# Patient Record
Sex: Male | Born: 1945 | Race: White | Hispanic: No | Marital: Single | Smoking: Never smoker
Health system: Southern US, Community
[De-identification: ages and names within clinical notes are randomized; demographics above are authoritative.]

## PROBLEM LIST (undated history)

## (undated) DIAGNOSIS — E7800 Pure hypercholesterolemia, unspecified: Secondary | ICD-10-CM

## (undated) DIAGNOSIS — I10 Essential (primary) hypertension: Secondary | ICD-10-CM

## (undated) DIAGNOSIS — I2510 Atherosclerotic heart disease of native coronary artery without angina pectoris: Secondary | ICD-10-CM

## (undated) DIAGNOSIS — G4700 Insomnia, unspecified: Secondary | ICD-10-CM

## (undated) DIAGNOSIS — I639 Cerebral infarction, unspecified: Secondary | ICD-10-CM

## (undated) DIAGNOSIS — K2970 Gastritis, unspecified, without bleeding: Secondary | ICD-10-CM

## (undated) HISTORY — PX: CATARACT EXTRACTION: SUR2

## (undated) HISTORY — DX: Insomnia, unspecified: G47.00

## (undated) HISTORY — DX: Pure hypercholesterolemia, unspecified: E78.00

## (undated) HISTORY — DX: Atherosclerotic heart disease of native coronary artery without angina pectoris: I25.10

## (undated) HISTORY — DX: Essential (primary) hypertension: I10

## (undated) HISTORY — DX: Cerebral infarction, unspecified: I63.9

## (undated) HISTORY — DX: Gastritis, unspecified, without bleeding: K29.70

---

## 1999-08-06 ENCOUNTER — Encounter: Payer: Self-pay | Admitting: Emergency Medicine

## 1999-08-06 ENCOUNTER — Emergency Department (HOSPITAL_COMMUNITY): Admission: EM | Admit: 1999-08-06 | Discharge: 1999-08-06 | Payer: Self-pay | Admitting: Emergency Medicine

## 1999-08-09 ENCOUNTER — Encounter: Payer: Self-pay | Admitting: *Deleted

## 1999-08-09 ENCOUNTER — Ambulatory Visit (HOSPITAL_COMMUNITY): Admission: RE | Admit: 1999-08-09 | Discharge: 1999-08-09 | Payer: Self-pay | Admitting: *Deleted

## 2000-04-15 ENCOUNTER — Emergency Department (HOSPITAL_COMMUNITY): Admission: EM | Admit: 2000-04-15 | Discharge: 2000-04-16 | Payer: Self-pay | Admitting: Emergency Medicine

## 2000-04-16 ENCOUNTER — Encounter: Payer: Self-pay | Admitting: Internal Medicine

## 2002-05-25 ENCOUNTER — Encounter: Admission: RE | Admit: 2002-05-25 | Discharge: 2002-05-25 | Payer: Self-pay | Admitting: Internal Medicine

## 2002-05-29 ENCOUNTER — Encounter: Admission: RE | Admit: 2002-05-29 | Discharge: 2002-05-29 | Payer: Self-pay | Admitting: Internal Medicine

## 2002-05-29 ENCOUNTER — Encounter: Admission: RE | Admit: 2002-05-29 | Discharge: 2002-05-29 | Payer: Self-pay | Admitting: *Deleted

## 2002-06-15 ENCOUNTER — Encounter: Admission: RE | Admit: 2002-06-15 | Discharge: 2002-06-15 | Payer: Self-pay | Admitting: Internal Medicine

## 2002-06-24 ENCOUNTER — Encounter: Admission: RE | Admit: 2002-06-24 | Discharge: 2002-09-22 | Payer: Self-pay | Admitting: Internal Medicine

## 2002-06-29 ENCOUNTER — Encounter: Admission: RE | Admit: 2002-06-29 | Discharge: 2002-06-29 | Payer: Self-pay | Admitting: Internal Medicine

## 2002-07-30 ENCOUNTER — Encounter: Admission: RE | Admit: 2002-07-30 | Discharge: 2002-07-30 | Payer: Self-pay | Admitting: Internal Medicine

## 2002-09-01 ENCOUNTER — Encounter: Admission: RE | Admit: 2002-09-01 | Discharge: 2002-09-01 | Payer: Self-pay | Admitting: Internal Medicine

## 2002-09-23 ENCOUNTER — Encounter: Admission: RE | Admit: 2002-09-23 | Discharge: 2002-12-22 | Payer: Self-pay | Admitting: Internal Medicine

## 2004-06-18 HISTORY — PX: CORONARY ARTERY BYPASS GRAFT: SHX141

## 2004-11-08 ENCOUNTER — Inpatient Hospital Stay (HOSPITAL_COMMUNITY): Admission: AD | Admit: 2004-11-08 | Discharge: 2004-11-13 | Payer: Self-pay | Admitting: Cardiology

## 2004-11-08 ENCOUNTER — Ambulatory Visit: Payer: Self-pay | Admitting: Cardiology

## 2004-11-17 ENCOUNTER — Emergency Department (HOSPITAL_COMMUNITY): Admission: EM | Admit: 2004-11-17 | Discharge: 2004-11-17 | Payer: Self-pay | Admitting: Emergency Medicine

## 2004-11-23 ENCOUNTER — Ambulatory Visit: Payer: Self-pay | Admitting: Cardiology

## 2004-12-01 ENCOUNTER — Ambulatory Visit: Payer: Self-pay | Admitting: Internal Medicine

## 2004-12-12 ENCOUNTER — Ambulatory Visit: Payer: Self-pay | Admitting: Internal Medicine

## 2004-12-20 ENCOUNTER — Ambulatory Visit: Payer: Self-pay | Admitting: Internal Medicine

## 2004-12-20 ENCOUNTER — Ambulatory Visit (HOSPITAL_COMMUNITY): Admission: RE | Admit: 2004-12-20 | Discharge: 2004-12-20 | Payer: Self-pay | Admitting: Internal Medicine

## 2005-01-03 ENCOUNTER — Ambulatory Visit: Payer: Self-pay | Admitting: Internal Medicine

## 2005-01-04 ENCOUNTER — Ambulatory Visit: Payer: Self-pay | Admitting: Cardiology

## 2005-01-08 ENCOUNTER — Encounter (HOSPITAL_COMMUNITY): Admission: RE | Admit: 2005-01-08 | Discharge: 2005-04-08 | Payer: Self-pay | Admitting: Cardiology

## 2005-01-10 ENCOUNTER — Ambulatory Visit: Payer: Self-pay | Admitting: Internal Medicine

## 2005-02-01 ENCOUNTER — Ambulatory Visit: Payer: Self-pay | Admitting: Internal Medicine

## 2005-03-07 ENCOUNTER — Ambulatory Visit: Payer: Self-pay | Admitting: Cardiology

## 2005-03-12 ENCOUNTER — Inpatient Hospital Stay (HOSPITAL_COMMUNITY): Admission: EM | Admit: 2005-03-12 | Discharge: 2005-03-16 | Payer: Self-pay | Admitting: Emergency Medicine

## 2005-03-12 ENCOUNTER — Ambulatory Visit: Payer: Self-pay | Admitting: Physical Medicine & Rehabilitation

## 2005-03-12 ENCOUNTER — Ambulatory Visit: Payer: Self-pay | Admitting: Internal Medicine

## 2005-03-13 ENCOUNTER — Encounter: Payer: Self-pay | Admitting: Cardiology

## 2005-03-13 ENCOUNTER — Ambulatory Visit: Payer: Self-pay | Admitting: Cardiology

## 2005-04-05 ENCOUNTER — Ambulatory Visit: Payer: Self-pay | Admitting: Cardiology

## 2005-05-16 ENCOUNTER — Ambulatory Visit: Payer: Self-pay | Admitting: Cardiology

## 2005-05-24 ENCOUNTER — Ambulatory Visit: Payer: Self-pay | Admitting: Cardiology

## 2005-10-08 ENCOUNTER — Emergency Department (HOSPITAL_COMMUNITY): Admission: EM | Admit: 2005-10-08 | Discharge: 2005-10-08 | Payer: Self-pay | Admitting: Emergency Medicine

## 2005-10-18 ENCOUNTER — Ambulatory Visit: Payer: Self-pay | Admitting: Cardiology

## 2005-10-31 ENCOUNTER — Ambulatory Visit: Payer: Self-pay | Admitting: Cardiology

## 2005-10-31 ENCOUNTER — Ambulatory Visit: Payer: Self-pay

## 2005-11-23 ENCOUNTER — Ambulatory Visit: Payer: Self-pay | Admitting: Internal Medicine

## 2005-12-04 ENCOUNTER — Ambulatory Visit: Payer: Self-pay | Admitting: Cardiology

## 2006-04-13 DIAGNOSIS — E119 Type 2 diabetes mellitus without complications: Secondary | ICD-10-CM | POA: Insufficient documentation

## 2006-04-13 DIAGNOSIS — H40009 Preglaucoma, unspecified, unspecified eye: Secondary | ICD-10-CM | POA: Insufficient documentation

## 2006-04-13 DIAGNOSIS — I2510 Atherosclerotic heart disease of native coronary artery without angina pectoris: Secondary | ICD-10-CM | POA: Insufficient documentation

## 2006-04-13 DIAGNOSIS — Z87898 Personal history of other specified conditions: Secondary | ICD-10-CM

## 2006-04-13 DIAGNOSIS — Z8679 Personal history of other diseases of the circulatory system: Secondary | ICD-10-CM

## 2006-04-13 DIAGNOSIS — E7800 Pure hypercholesterolemia, unspecified: Secondary | ICD-10-CM

## 2006-04-13 DIAGNOSIS — R079 Chest pain, unspecified: Secondary | ICD-10-CM | POA: Insufficient documentation

## 2006-04-13 DIAGNOSIS — R1013 Epigastric pain: Secondary | ICD-10-CM

## 2006-04-13 DIAGNOSIS — G4700 Insomnia, unspecified: Secondary | ICD-10-CM

## 2006-04-13 DIAGNOSIS — Z8673 Personal history of transient ischemic attack (TIA), and cerebral infarction without residual deficits: Secondary | ICD-10-CM | POA: Insufficient documentation

## 2006-04-13 DIAGNOSIS — I10 Essential (primary) hypertension: Secondary | ICD-10-CM | POA: Insufficient documentation

## 2006-05-07 ENCOUNTER — Ambulatory Visit: Payer: Self-pay | Admitting: Cardiology

## 2006-05-15 ENCOUNTER — Ambulatory Visit: Payer: Self-pay | Admitting: Internal Medicine

## 2006-05-15 ENCOUNTER — Ambulatory Visit (HOSPITAL_COMMUNITY): Admission: RE | Admit: 2006-05-15 | Discharge: 2006-05-15 | Payer: Self-pay | Admitting: Cardiology

## 2006-05-25 DIAGNOSIS — Z951 Presence of aortocoronary bypass graft: Secondary | ICD-10-CM

## 2006-06-25 ENCOUNTER — Emergency Department (HOSPITAL_COMMUNITY): Admission: EM | Admit: 2006-06-25 | Discharge: 2006-06-25 | Payer: Self-pay | Admitting: Emergency Medicine

## 2006-07-10 ENCOUNTER — Ambulatory Visit: Payer: Self-pay | Admitting: Cardiology

## 2007-03-07 ENCOUNTER — Telehealth: Payer: Self-pay | Admitting: Internal Medicine

## 2007-03-28 ENCOUNTER — Emergency Department (HOSPITAL_COMMUNITY): Admission: EM | Admit: 2007-03-28 | Discharge: 2007-03-28 | Payer: Self-pay | Admitting: Emergency Medicine

## 2007-04-07 ENCOUNTER — Ambulatory Visit: Payer: Self-pay | Admitting: Internal Medicine

## 2007-04-30 ENCOUNTER — Ambulatory Visit: Payer: Self-pay

## 2007-04-30 LAB — CONVERTED CEMR LAB
ALT: 29 units/L (ref 0–53)
AST: 30 units/L (ref 0–37)
LDL Cholesterol: 120 mg/dL — ABNORMAL HIGH (ref 0–99)
Total CHOL/HDL Ratio: 3.9

## 2007-05-23 ENCOUNTER — Emergency Department (HOSPITAL_COMMUNITY): Admission: EM | Admit: 2007-05-23 | Discharge: 2007-05-23 | Payer: Self-pay | Admitting: Family Medicine

## 2007-06-23 ENCOUNTER — Ambulatory Visit: Payer: Self-pay | Admitting: Internal Medicine

## 2007-06-25 ENCOUNTER — Encounter (INDEPENDENT_AMBULATORY_CARE_PROVIDER_SITE_OTHER): Payer: Self-pay | Admitting: *Deleted

## 2007-06-30 ENCOUNTER — Ambulatory Visit (HOSPITAL_COMMUNITY): Admission: RE | Admit: 2007-06-30 | Discharge: 2007-06-30 | Payer: Self-pay | Admitting: Internal Medicine

## 2007-06-30 ENCOUNTER — Ambulatory Visit: Payer: Self-pay | Admitting: Endocrinology

## 2007-07-29 ENCOUNTER — Ambulatory Visit: Payer: Self-pay | Admitting: Internal Medicine

## 2007-08-29 ENCOUNTER — Ambulatory Visit: Payer: Self-pay | Admitting: Internal Medicine

## 2008-01-01 ENCOUNTER — Inpatient Hospital Stay (HOSPITAL_COMMUNITY): Admission: EM | Admit: 2008-01-01 | Discharge: 2008-01-02 | Payer: Self-pay | Admitting: Emergency Medicine

## 2008-01-01 ENCOUNTER — Encounter (INDEPENDENT_AMBULATORY_CARE_PROVIDER_SITE_OTHER): Payer: Self-pay | Admitting: Emergency Medicine

## 2008-01-01 ENCOUNTER — Ambulatory Visit: Payer: Self-pay | Admitting: Vascular Surgery

## 2008-02-09 ENCOUNTER — Ambulatory Visit: Payer: Self-pay | Admitting: Internal Medicine

## 2008-07-26 ENCOUNTER — Ambulatory Visit: Payer: Self-pay | Admitting: Internal Medicine

## 2008-07-28 ENCOUNTER — Ambulatory Visit: Payer: Self-pay

## 2008-10-22 ENCOUNTER — Ambulatory Visit: Payer: Self-pay | Admitting: Internal Medicine

## 2008-10-22 ENCOUNTER — Encounter (INDEPENDENT_AMBULATORY_CARE_PROVIDER_SITE_OTHER): Payer: Self-pay | Admitting: *Deleted

## 2008-10-28 ENCOUNTER — Telehealth: Payer: Self-pay | Admitting: Internal Medicine

## 2008-10-29 ENCOUNTER — Ambulatory Visit: Payer: Self-pay | Admitting: Cardiovascular Disease

## 2008-10-29 ENCOUNTER — Inpatient Hospital Stay (HOSPITAL_BASED_OUTPATIENT_CLINIC_OR_DEPARTMENT_OTHER): Admission: RE | Admit: 2008-10-29 | Discharge: 2008-10-29 | Payer: Self-pay | Admitting: Cardiovascular Disease

## 2008-11-18 ENCOUNTER — Ambulatory Visit: Payer: Self-pay | Admitting: Internal Medicine

## 2009-03-23 ENCOUNTER — Encounter (INDEPENDENT_AMBULATORY_CARE_PROVIDER_SITE_OTHER): Payer: Self-pay | Admitting: *Deleted

## 2010-07-16 LAB — CONVERTED CEMR LAB
Basophils Absolute: 0.1 10*3/uL (ref 0.0–0.1)
Chloride: 101 meq/L (ref 96–112)
HCT: 33.5 % — ABNORMAL LOW (ref 39.0–52.0)
Hemoglobin: 11.4 g/dL — ABNORMAL LOW (ref 13.0–17.0)
Lymphocytes Relative: 31 % (ref 12–46)
Lymphs Abs: 2.9 10*3/uL (ref 0.7–4.0)
MCV: 88.6 fL (ref 78.0–100.0)
Monocytes Absolute: 1 10*3/uL (ref 0.1–1.0)
Monocytes Relative: 11 % (ref 3–12)
Platelets: 230 10*3/uL (ref 150–400)
Sodium: 136 meq/L (ref 135–145)
WBC: 9.2 10*3/uL (ref 4.0–10.5)
aPTT: 35 s (ref 24–37)

## 2010-09-26 LAB — POCT I-STAT GLUCOSE: Operator id: 221371

## 2010-10-31 NOTE — Assessment & Plan Note (Signed)
[facility]                            CARDIOLOGY OFFICE NOTE   ZYRUS, HETLAND                        MRN:          [id number]  DATE:06/23/2007                            DOB:          02-27-1946    PATIENT IDENTIFICATION:  [name] is a patient who I saw back in  October. He was a previous patient of Geralynn Rile. 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   When I saw him last, his last cardiac catheterization actually was in  November 2007. He was having some complaints of chest pain a lot. I went  ahead and scheduled a Myoview.   He had this done on November 12. This showed no evidence of a  significant ischemia, no change from the previous scans. He did have  evidence of inferolateral infarct with minimal periinfart ischemia.   He still has intermittent chest pains that are on the left side. He says  he has been told he has had gallbladder problems in the past. No change  though from when I saw him in October. His activities are unchanged. He  does complain of some numbness and achiness in his hands and feet and  his arms. He claims it is more of a stiffness.   CURRENT MEDICATIONS:  1. Multivitamin daily.  2. Aspirin 325.  3. Crestor 10.  4. Atenolol 25.  5. Enalapril 2.5.  6. Cosopt eye drops.   PHYSICAL EXAMINATION:  GENERAL:  The patient is in no distress.  VITAL SIGNS:  Blood pressure is 149/75, pulse is 64 and regular, weight  180.  LUNGS:  Clear.  CARDIAC:  Regular rate and rhythm, S1, S2, no S3.  ABDOMEN:  Mild epigastric tenderness. No masses. No rebound.  EXTREMITIES:  No edema.   IMPRESSION:  1. Coronary artery disease. I am not convinced the patient's current      symptoms are worsening ischemia. He has had chest pain in the past.      He has been told that he had gallstones in the past. I will go      ahead and schedule him for a right upper  quadrant ultrasound to      confirm. I do not see any ER history of this.  2. Dyslipidemia. Will continue on the Crestor but I would like to have      him come off of this to see if his achiness improves. He will call      with his response, if it does not he should call back on it.  3. Hypertension. Blood pressure is a little high today. It has been      good in the past, will follow. He is due to see Romero Belling soon.  4. History of a stroke in the past, normal carotid arteries in      November.  5. History of sinus congestion. Will set up for a sinus CT followup      with this.   Otherwise I  will set followup for late this summer. Again, he has  primary care followup in the near future.     Pricilla Riffle, MD, [facility]  Electronically Signed    PVR/MedQ  DD: 06/23/2007  DT: 06/24/2007  Job #: [id number]

## 2010-10-31 NOTE — Discharge Summary (Signed)
[name], [name]                 ACCOUNT NO.:  [id number]   MEDICAL RECORD NO.:  [id number]          PATIENT TYPE:  INP   LOCATION:  1539                         FACILITY:  [facility]   PHYSICIAN:  Hillery Aldo, M.D.   DATE OF BIRTH:  1945-11-20   DATE OF ADMISSION:  01/01/2008  DATE OF DISCHARGE:  01/02/2008                               DISCHARGE SUMMARY   PRIMARY CARE PHYSICIAN:  Dr. Renae Fickle   DISCHARGE DIAGNOSES:  1. Right right upper extremity deep venous thrombosis.  2. Diabetes.  3. 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  4. Glaucoma.  5. Dyslipidemia.  6. Hypertension.   DISCHARGE MEDICATIONS:  1. Lovenox 120 mg subcutaneously daily.  2. Coumadin 5 mg q.p.m. or as directed.  3. Clonazepam 0.5 to 1 mg q.h.s. p.r.n.  4. Vicodin 03/499 q.6 h p.r.n.  5. Nitroglycerin 0.4 mg sublingually p.r.n.  6. Septra DS 1 tab daily.  7. Aspirin 325 mg daily.  8. Cosopt eye drops 1 drop in the right eye daily.  9. Lantus insulin 7 units subcutaneously q.h.s.  10.Humulog insulin 2-3 units subcutaneously before meals.  11.Multivitamin daily.  12.Iron 325 mg daily.   CONSULTATION:  None.   BRIEF ADMISSION HISTORY OF PRESENT ILLNESS:  The patient is a 65-year-  old male who presented with a chief complaint of right upper extremity  swelling in the setting of recent removal of a peripherally inserted  central catheter for prolonged antibiotic administration.  Upon initial  evaluation in the emergency department, he is found to have a deep  venous thrombosis and therefore was admitted for 23-hour observation for  initiation of anticoagulation.  For full details, please see my dictated  report.   PROCEDURES AND DIAGNOSTIC STUDIES:  1. Doppler ultrasound of the right upper extremity confirmed deep      venous thrombosis on January 01, 2008.  2. Two views of the chest on January 01, 2008 showed slight      hyperinflation configuration with no  pulmonary edema, pneumonia or      other acute process.   DISCHARGE LABORATORY VALUES:  PT was 13.3, INR 1.0.   HOSPITAL COURSE BY PROBLEM:  Acute deep venous thrombosis of the right  upper extremity:  The patient was admitted and commenced on Lovenox,  Coumadin therapy.  He was taught how to self-inject Lovenox.  A home  health nurse was set up to draw his pro times daily and to fax these to  Dr. Samuel Germany for ongoing adjustment of his Coumadin dose to achieve a  therapeutic INR between 2-3.   The patient's other medical problems remained stable   DISPOSITION:  The patient is medically stable for discharge home.      Hillery Aldo, M.D.  Electronically Signed     CR/MEDQ  D:  01/02/2008  T:  01/02/2008  Job:  [id number]   cc:   Renae Fickle  Fax: [phone]

## 2010-10-31 NOTE — Assessment & Plan Note (Signed)
[facility]                            CARDIOLOGY OFFICE NOTE   [name], ALA                        MRN:          [id number]  DATE:07/26/2008                            DOB:          14-Aug-1945    IDENTIFICATION:  [name] is a 65 year old gentleman with a history of  CAD (status post non-ST segment elevation MI in May 2006.  The patient  underwent emergent CABG complicated by an occipital CVA).  Also with a  history of dyslipidemia, hypertension.  I last saw him back in August.   He comes in today since he has been having chest pains.  He has toe  amputated last spring is slowly increasing his activity.  He describes  the episodes of pressure that can last all day and might be exacerbated  by food unclear.  Today, he has pain in the 2-4/10 and it has been with  him it sounds like since this morning.  His breathing has been a little  short for a while.  Has had some nausea and vomiting.   Current medicines include insulin as directed, cold eye drops, iron,  aspirin 81 mg daily.  Note, the patient is no longer on Crestor,  enalapril, or atenolol.   PHYSICAL EXAMINATION:  GENERAL:  The patient is in no distress at rest,  though he reports 2/10 chest pressure.  VITAL SIGNS:  Blood pressure is 112/60, pulse 63 and regular, weight 180  up 4 pounds from previous.  NECK:  JVP is normal.  LUNGS:  No rales or wheezes.  CARDIAC:  Regular rate and rhythm.  S1, S2, no S3, no murmurs.  ABDOMEN:  Benign.  EXTREMITIES:  No edema.   A 12-lead EKG, normal sinus rhythm, 68 beats per minute.   IMPRESSION:  1. Chest pain.  I am not convinced it is angina.  Question if it is      more gastrointestinal related.  I would set him up for a Myoview      scan, last one was in November 2008.  I will also place him on      omeprazole.  He should continue on the aspirin.  2. Dyslipidemia.  Crestor is not paid for.  I will put him on Zocor      and follow up lipids  in 8-12 weeks with AST.  3. Coronary artery disease.  Again Myoview as noted.   I will set to see the patient back in the fall.  I will be in touch with  him once I have seen the test results he has with his Myoview compared  to previous.    Pricilla Riffle, MD, [facility]  Electronically Signed   PVR/MedQ  DD: 07/26/2008  DT: 07/27/2008  Job #: [id number]

## 2010-10-31 NOTE — Cardiovascular Report (Signed)
NAMEKADAN, MILLSTEIN                 ACCOUNT NO.:  [id number]   MEDICAL RECORD NO.:  [id number]          PATIENT TYPE:  OIB   LOCATION:  1963                         FACILITY:  [facility]   PHYSICIAN:  Verne Carrow, MDDATE OF BIRTH:  1946-05-04   DATE OF PROCEDURE:  10/29/2008  DATE OF DISCHARGE:  10/29/2008                            CARDIAC CATHETERIZATION   PRIMARY CARDIOLOGIST:  Pricilla Riffle, MD, [facility]   PROCEDURE PERFORMED:  1. Left heart catheterization  2. Selective coronary angiography.  3. Saphenous vein graft angiography.  4. Left internal mammary artery bypass graft angiography.  5. Left ventricular angiogram.   OPERATOR:  Verne Carrow, MD   INDICATIONS:  Exertional angina in a 65 year old Caucasian male who had  four-vessel bypass performed in 2006 who has a history of diabetes  mellitus, hypertension, and hyperlipidemia.  The patient apparently  presented to [facility] last weekend  complaining of weakness and had a positive CK-MB fraction.  The specific  level of this is not documented in the record.  The patient was  transferred to [facility] at that time for cardiac  catheterization but refused to stay.  He was then seen by Dr. Tenny Craw in  her office on Oct 22, 2008, and plans for this left heart catheterization  were made at that time.   DETAILS OF PROCEDURE:  The patient was brought into the outpatient  cardiac catheterization laboratory after signing informed consent for  the procedure.  The right groin was prepped and draped in sterile  fashion. Lidocaine 1% was used for local anesthesia.  A 4-French sheath  was inserted into the right femoral artery without difficulty.  Standard  diagnostic catheters were used to perform selective coronary  angiography.  The 3-D RC catheter was used to selectively engage the  native right coronary artery, both saphenous vein grafts, and the left  internal mammary artery bypass graft.   Following the performance of  coronary angiography, a left ventricular angiogram was performed with a  pigtail catheter.  There was no pressure gradient across the aortic  valve with pullback of the pigtail catheter.  The patient tolerated the  procedure well and was taken to the holding area in stable condition.   HEMODYNAMIC FINDINGS:  Central aortic pressure 120/64.  Left ventricular  pressure 120/15.  Left ventricular end-diastolic pressure 22.   ANGIOGRAPHIC FINDINGS:  1. The left main coronary artery is a long vessel that has a long      tubular 60% stenosis throughout the proximal and midportion and a      90% stenosis in the distal portion.  2. Left anterior descending is totally occluded at the ostium.  The      mid and distal LAD are known to fill from right to left collaterals      and from the left internal mammary artery graft.  3. The circumflex artery has an 80% ostial lesion and gives off an      early ramus intermedius branch and 2 small obtuse marginal      branches.  The ramus intermedius is  totally occluded proximally.      The first and second obtuse marginal are small.  The mid circumflex      is totally occluded.  4. The right coronary artery is a large-dominant vessel that gives      rise to a moderate-sized posterior descending artery and several      small posterolateral branches.  There are right to left collaterals      seen filling a portion of the left anterior descending.  The      proximal right coronary artery has a 50-60% long tubular lesion.      There are focal 50% lesions in both the mid and distal right      coronary artery.  There is plaque disease noted in the posterior      descending artery.  5. Saphenous vein graft to the ramus intermedius and sequential to the      posterolateral branch is patent.  There is a 20% stenosis in the      proximal body of the vein graft.  There is also a 20% stenosis at      the area of a valve in the body of  the saphenous vein graft between      the 2 points of anastomosis between the vessels.  6. The saphenous vein graft to the diagonal branch is patent.  7. The left internal mammary artery to the left anterior descending is      patent.  There is moderate diffuse disease noted in the distal LAD      but no focal lesions.  8. Left ventricular angiogram was performed in the RAO projection and      shows mild reduction in LV systolic function with ejection fraction      of 40-45%.  There is no significant mitral regurgitation noted.   IMPRESSION:  1. Severe double-vessel coronary artery disease with 4/4 patent bypass      grafts.  2. Moderately severe disease in the native right coronary artery with      no distal protection from bypass grafts.  3. Mild reduction in global left ventricular systolic function.   RECOMMENDATIONS:  I recommend continued medical management.  Followup  will be arranged with Dr. Dietrich Pates in 1-2 weeks.      Verne Carrow, MD  Electronically Signed     CM/MEDQ  D:  10/29/2008  T:  10/30/2008  Job:  [id number]   cc:   Pricilla Riffle, MD, [facility]

## 2010-10-31 NOTE — Assessment & Plan Note (Signed)
[facility]                            CARDIOLOGY OFFICE NOTE   [name], [name]                        MRN:          [id number]  DATE:02/09/2008                            DOB:          28-Nov-1945    IDENTIFICATION:  [name] is a 65 year old gentleman.  I last saw him  in January.  He has a history of CAD (status post non-Q-wave MI in May  2006 with emergent by CABG complicated by an occipital CVA),  dyslipidemia, hypertension, and sinus congestion.   Since seen, he has been seen in GI Clinic for abdominal pain.  He had  upper GI series to evaluate by Dr. Stan Head.  I do not have full  report of this.   Since seen, the patient denies chest pain.  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  The PICC line developed a clot and DVT is noted.  He was admitted for discontinuation and anticoagulation therapy.  The  patient was sent home on aspirin, Lovenox, and Coumadin.  Note, his  statin was discontinued as well as his beta blocker.  He has not  restarted them.   CURRENT MEDICINES:  Eye drops, insulin, Coumadin as directed,  sulfamethoxazole, antibiotic, and iron injection.   PHYSICAL EXAMINATION:  GENERAL:  The patient is in no distress at rest.  VITAL SIGNS:  Blood pressure 141/61, pulse 66 and regular, and weight  176.  LUNGS:  Clear.  CARDIAC:  Regular rate and rhythm.  S1 and S2.  No S3.  No significant  murmurs.  ABDOMEN:  Benign.  EXTREMITIES:  Left foot in a bandage, trivial right lower extremity  edema.   LABORATORY DATA:  An 12-lead EKG normal sinus rhythm 63 beats per  minute.   IMPRESSION:  The patient is now continuing oral antibiotics for his  infection.  He is on Coumadin after having arm DVT indeed his right arm  is back to normal in size.  1. Coronary artery disease.  He needs to get back on his medicines.  I      have written a  prescription for his atenolol 25 and enalapril 2.5      and this will need to be followed.  Continue on aspirin, but at 81      mg with enteric-coated.  2. Dyslipidemia.  He needs to get back on his Crestor.  I had      increased to 20.  He will need to have a followup.   He will have follow up later in the fall.  Continue on his insulin and  continue on antibiotics.     Pricilla Riffle, MD, [facility]  Electronically Signed    PVR/MedQ  DD: 02/09/2008  DT: 02/10/2008  Job #: [id number]

## 2010-10-31 NOTE — Assessment & Plan Note (Signed)
[facility]                         GASTROENTEROLOGY OFFICE NOTE   ELMOR, KOST                        MRN:          [id number]  DATE:07/29/2007                            DOB:          April 22, 1946    REFERRING PHYSICIAN:  Pricilla Riffle, MD, [facility]   ASSESSMENT:  Chronic recurrent upper abdominal pain, probably for two  years after myocardial infarction.  Abdominal ultrasound shows what is  thought to be a sludge ball, versus inspissated bile.  I do not think  this is a biliary problem.  He has some underlying anxiety and panic  disturbance.  He has diabetes mellitus.  It sounds like it is not well-  controlled, so I think gastroparesis or functional GI disturbance is  possible.  However, more serious causes could be present, as well.   PLAN:  Schedule upper GI endoscopy to investigate.  Consider gastric  emptying study.  He has never had a screening colonoscopy.  Depending  upon the results of this workup, that is something that could be  undertaken and we will discuss with him after this.   HISTORY:  This is a 65 year old white man, who sounds like he was doing  reasonably well until he had myocardial infarction about two years ago,  or perhaps three years ago, in 2006.  He had coronary bypass grafting.  Since that time, he has had mainly postprandial upper abdominal  discomfort and bloating, though it is not always postprandial.  He has  had a lot of intermittent chest pains.  He had a cardiac catheterization  in 2007, because of recurrent chest pain, and this was unrevealing, and  a Myoview was okay, as well.  He does not vomit or have significant  nausea.  There may be a little bit of early satiety.  His diabetes  fluctuates and he says his sugars not well-controlled all the time.  He  has neuropathic symptoms, as well.  He had an ultrasound because of the  abdominal pain, which included some right upper quadrant pain, which  showed the  findings as outlined above.  I reviewed this report of  June 30, 2007.  He denies any weight-loss.  His GI review of systems  is otherwise negative.  He has never had a screening colonoscopy.  There  is no family history of colon cancer.   PAST MEDICAL HISTORY:  1. Myocardial infarction, May 2006.  2. Coronary artery bypass grafting, May of 2006, Dr. Laneta Simmers.  3. Diabetes mellitus, type 2.  4. Hypertension.  5. Dyslipidemia.  6. Increased intraocular pressure; glaucoma.  7. Insomnia.  8. History of stroke.  9. History of sinusitis.   MEDICATIONS:  Listed and reviewed in the chart.  They include  multivitamin, aspirin, Atenolol, enalapril, Cosopt eye drops.  Insulin  as directed.  He takes Lantus 10 at bedtime and Humalog with meals.   There are no known drug allergies.   FAMILY HISTORY:  Our review was negative.  See medical history form.   SOCIAL HISTORY:  He is single at this time.  Son died after being bitten  by a copperhead, August 29 of last year.  He is still grieving over  that.  He is unable to work since his myocardial infarction.  He  eventually lost his insurance and is now on Medicaid.  He cannot find a  primary care physician at home in [place].  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  He actually used to run 26 miles a  day prior to his myocardial infarction.  There is no alcohol, tobacco or  drug use at this time.   REVIEW OF SYSTEMS:  Positive for diffuse numbness, fatigue, significant  insomnia, especially exacerbated since he lost his son.  Anxiety and  headaches, exacerbated since then.  He has polyuria, occasional dyspnea,  pedal edema, fatigue.  All other systems are negative.   PHYSICAL EXAM:  Height 5 feet 11, weight 179 pounds.  Blood pressure  130/66, pulse 72.  This is a thin, middle-aged, white man, looking slightly chronically  ill.  EYES:  Anicteric.  ENT:  Normal mouth and pharynx.  NECK:  Supple, no thyromegaly or mass.   CHEST:  Clear.  There is a coronary artery bypass graft scar present.  HEART:  S1, S2, no murmurs or gallops.  ABDOMEN:  Soft, nontender, no organomegaly or mass.  LYMPHATIC:  No neck, supraclavicular or groin adenopathy.  LOWER EXTREMITIES:  Showed trace edema bilaterally.  SKIN:  Shows previous healing ulcerations from trauma, he says, right  greater than left pretibial area.  NEUROLOGIC:  Cranial nerves II through XII intact, grossly nonfocal.  PSYCHIATRIC:  He appears slightly anxious.  Occasionally he tears up  when speaking about his son.   I have reviewed the office notes.  I reviewed the ultrasound report.   NOTE:  I doubt we would need to do so, but I think an MRI would be the  next step to clarify the gallbladder problems, versus an endoscopic  ultrasound, i.e. I really do not think there is any problem there.  I  think that is a nonspecific, inconsequential finding, labeled as  inspissated bile, versus sludge ball.   He gets his primary care at [facility].     Iva Boop, MD,FACG  Electronically Signed    CEG/MedQ  DD: 07/29/2007  DT: 07/30/2007  Job #: [id number]

## 2010-10-31 NOTE — H&P (Signed)
[name], [name]                 ACCOUNT NO.:  [id number]   MEDICAL RECORD NO.:  [id number]          PATIENT TYPE:  INP   LOCATION:  1539                         FACILITY:  [facility]   PHYSICIAN:  Hillery Aldo, M.D.   DATE OF BIRTH:  [birth year]   DATE OF ADMISSION:  01/01/2008  DATE OF DISCHARGE:                              HISTORY & PHYSICAL   PRIMARY CARE PHYSICIAN:  Dr. Renae Fickle.   CHIEF COMPLAINT:  Right arm swelling.   HISTORY OF PRESENT ILLNESS:  The patient is a 65 year old male with a  history of recent left first ray toe amputation secondary to  osteomyelitis, status post placement of a peripherally inserted central  catheter line for prolonged IV antibiotic therapy.  The patient  apparently had IV antibiotics for 6 weeks and the catheter line was  removed 3 days ago.  Over the past 24 hours, he has had increased right  upper extremity swelling and tenderness.  The home health nurse sent to  the Emergency Department for evaluation for possible blood clot.  A  Doppler study confirmed right upper extremity DVT in the brachial vein,  axillary vein, and cephalic vein.  The patient, therefore, is admitted  for commencement of therapeutic anticoagulation.   PAST MEDICAL HISTORY:  1. Coronary artery disease status post MI in May, 2006, status post      coronary artery bypass graft.  2. Type 2 diabetes.  3. Hypertension.  4. Dyslipidemia.  5. Glaucoma.  6. Insomnia.  7. 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  8. Chronic allergic sinusitis.   PAST SURGICAL HISTORY:  1. Coronary artery bypass grafting.  2. Cataract surgery.  3. Left great toe amputation.   ALLERGIES:  No known drug allergies.   MEDICATIONS:  1. Clonazepam 0.5 to 1 mg q.h.s. p.r.n.  2. Vicodin 10/500 q.6h p.r.n.  3. Nitroglycerin 0.4 mg sublingually p.r.n.  4. Sulfamethoxazole DS 1 tablet daily.  5. Aspirin 325 mg daily.  6. CoSopt eye drops, 1 drop in the right eye daily.  7.  Lantus insulin 7 units subcutaneously q.h.s.  8. Humalog insulin 2-3 units subcutaneously before meals.  9. Multivitamin daily.  10.Iron 325 mg daily.   SOCIAL HISTORY:  The patient is single.  He lives alone.  He quit  smoking 5-6 years ago but prior to this had a two pack per day habit.  He has a history of marathon running and regularly ran 26 miles prior to  his heart attack.  Denies any alcohol use.  Denies any history of drug  abuse with the exception of marijuana in the past.  He is a retired  Corporate investment banker.   FAMILY HISTORY:  Patient's mother is alive at 63 and is described as old  and feeble but no specific medical problems.  Patient's father died at  64 from complications of diabetes.  Patient has three brothers and two  sisters that are healthy.  He has 1 son who is deceased at age 90  secondary to complications of a snake bite.   REVIEW OF SYSTEMS:  Patient denies any fever,  chills.  He has had some  diminished appetite but no weight loss.  He gets occasional chest  tightness but no shortness of breath.  A comprehensive 14-point review  of systems is otherwise negative except for the elements as noted in the  HPI above.   PHYSICAL EXAM:  VITAL SIGNS:  97.7, blood pressure 130/69, pulse 55,  respirations 16, O2 saturation 98% on room air.  GENERAL:  A well-developed, well-nourished male in no acute distress.  HEENT:  Normocephalic, atraumatic.  PERRL.  EOMI.  Oropharynx is clear.  NECK:  Supple, no thyromegaly, no lymphadenopathy, no jugular venous  distention.  CHEST:  Lungs clear to auscultation bilaterally with good air movement.  HEART:  Regular rate, rhythm.  No murmurs, rubs, or gallops.  ABDOMEN:  Soft, nontender, nondistended with normoactive bowel sounds.  EXTREMITIES:  No clubbing, edema, or cyanosis.  The patient had a left  vac wound dressing in place to the left great toe area.  There is some  mild surrounding erythema.  There is right upper extremity  swelling.  SKIN:  Warm and dry.  No rashes.  NEUROLOGIC:  The patient is alert and oriented x3.  Homonomous  hemianopsia.  Otherwise nonfocal.   DATA REVIEW:  Doppler studies show right upper extremity DVT involving  the brachial, axillary, and cephalic veins.   LABORATORY DATA:  Sodium is 140, potassium 4.8, chloride 105, bicarb 29,  BUN 20, creatinine 1.13, glucose 310.  Liver function studies are within  normal limits.  Albumin is low at 2.9.   PT is 12.2, PTT 22.  White blood cell count is 9.4, hemoglobin 11.7,  hematocrit 34.9, platelets 213,000.   ASSESSMENT/PLAN:  1. Right upper extremity deep venous thrombosis:  We will initiate      therapy with Lovenox and Coumadin.  We will teach the patient how      to self-inject Lovenox with the plan to discharge him home      tomorrow.  We will have Home Health nurses draw daily PT/INRs and      fax these results to his primary care physician for Coumadin      dosing.  2. Diabetes:  Continue the patient's Lantus and sliding scale insulin.  3. Left lower extremity wound:  Continue the vac dressing and Septra.  4. Glaucoma:  Continue the patient's CoSopt eye drops.  5. History of dyslipidemia:  Place the patient on carbohydrate-      modified, heart-healthy diet.  6. Hypertension:  The patient is not currently hypertensive.  Will      monitor closely.   DISPOSITION:  The patient will likely be discharged home once it is  ascertained that his insurance company will pay for Lovenox injections  and he has been satisfactorily taught how to self-administer Lovenox.      Hillery Aldo, M.D.  Electronically Signed     CR/MEDQ  D:  01/01/2008  T:  01/01/2008  Job:  [id number]   cc:   Renae Fickle  Fax: [phone]

## 2010-10-31 NOTE — Assessment & Plan Note (Signed)
[facility]                            CARDIOLOGY OFFICE NOTE   LONZIE, SIMMER                        MRN:          [id number]  DATE:04/07/2007                            DOB:          [birth year]    IDENTIFICATION:  Mr. Caraveo is a gentleman who had been followed by Dr.  Samule Ohm.  He is 65 years old and had a non-ST elevation MI in May, 2006  and underwent emergent CABG.  This was complicated by an occipital  stroke.  He was last seen in cardiology clinic actually back in January  of 2008.   Since seen, he continues to have daily pain in  his chest; it can be  sharp.  It occurs a lot with stress or if he is panicked.  Occasionally  he has numbness in his arms or his arms are stiff.  He says the pain  seems different from his bypass which went up and down his arm.  He also  notes some tightness now here in the office.  He notes no changes  breathing.  He says he walks actually about 5 miles daily.   Note, he is being evaluated for cataract surgery.   CURRENT MEDICATIONS:  His current medications include:  1. Multivitamin daily.  2. Aspirin 325 mg.  3. Crestor 10.  4. Atenolol 25.  5. Enalapril 2.5.  6. Cosopt eye drops.   PHYSICAL EXAMINATION:  GENERAL APPEARANCE:  The patient is in no  distress.  VITAL SIGNS:  Blood pressure 104/59.  Pulse is 66.  Weight 174.  LUNGS:  Clear.  CARDIAC:  Regular rate and rhythm, S1, S2.  No S3.  No significant  murmurs.  ABDOMEN:  Benign.  EXTREMITIES:  Show  no edema.  2+ pulses.   CLINICAL DATA:  12-lead EKG normal sinus rhythm.  63 beats per minute.   IMPRESSION:  1. Coronary artery disease.  Last cardiac catheterization was in      November, 2007, (left main with distal 90% stenosis, left anterior      descending was occluded ostially, circumflex had a 90% ostial      lesion, ramus was occluded proximally, right coronary artery was      dominant.  There were faint right to left collaterals to the  left      anterior descending.  Proximal portion had a 50% lesion.  Saphenous      vein graft to diagonal was patent.  Saphenous vein graft to ramus      was patent, left internal mammary artery to left anterior      descending was patent with moderate diffuse disease distally but no      focal lesions.  At the time of catheterization there was minimal      step-up in the superior vena cava to right ventricle consistent      with a very small potential intracardiac shunt).  I am not      convinced the patient's symptoms have changed, but he is having a      lot of pain.  He had  a Myoview done back in May of 2007 prior to      the catheterization.  I would recommend repeating given that he is      going for surgery.  2. History of cerebrovascular accident.  Will check carotid Doppler's.  3. Hypertension.  Good control.  4. Dyslipidemia.  I am not sure when his last lipid panel was done.      We will have him come back and get this at the time of his Myoview,      (I have November 2006).     Pricilla Riffle, MD, [facility]  Electronically Signed    PVR/MedQ  DD: 04/08/2007  DT: 04/09/2007  Job #: [id number]

## 2010-11-03 NOTE — Letter (Signed)
September 10, 2006    Vonita Moss, M.D.  [facility]  [address] [place], [place] [address]   RE:  [name], [name]  MRN:  [id number]  /  DOB:  Nov 14, 1945   Dear Dr. Dossie Arbour:   I am replying to your letter of March 17th regarding Mr. Stillion.  Mr.  Rattigan has had a myocardial infarction and prior stroke.  He has no  contraindication to the planned course of dental care.  He needs no  antibiotic prophylaxis.  He can use whatever postoperative analgesics  you think appropriate.  I had asked that he continue on his daily  aspirin, if your planned procedure can be done on it.   In your letter, you state that Mr. Jerger says he takes only insulin and  a daily aspirin.  I last saw him on July 10, 2006.  At that time, his  medications were:  1. Insulin.  2. Lumigan eye drops.  3. Aspirin 325 mg daily.  4. Crestor 10 mg daily.  5. Atenolol 25 mg daily.  6. Enalapril 2.5 mg daily.   It is possible that he has stopped these on his own.  However, that  would be against my advice.   HE HAS NO KNOWN DRUG ALLERGIES.   Please feel free to contact me should you have any further questions.    Sincerely,      Salvadore Farber, MD  Electronically Signed    WED/MedQ  DD: 09/10/2006  DT: 09/10/2006  Job #: [id number]

## 2010-11-03 NOTE — Assessment & Plan Note (Signed)
[facility]                            CARDIOLOGY OFFICE NOTE   Shannon, Matthews                        MRN:          [id number]  DATE:07/10/2006                            DOB:          [birth year]    PRIMARY CARE PHYSICIAN:  Dr. Lendell Matthews, [facility] [address]   HISTORY OF PRESENT ILLNESS:  Mr. [name] is a 65 year old gentleman who  suffered non ST elevation myocardial infarction in May 2006. He  underwent emergent coronary artery bypass grafting by Dr. Laneta Matthews. EF is  51%. CABG was complicated by an occipital stroke.   Due to continued chest pain and dyspnea with fairly atypical  presentation, we took him to cardiac catheterization in late November  2007. That demonstrated normal right and left heart filling pressures,  normal cardiac index, EF 51%, and patency of all bypass grafts.   CURRENT MEDICATIONS:  1. Novolin insulin 70/30 as directed by Dr. Lendell Matthews.  2. Lumigan eye drops.  3. Multivitamin.  4. Aspirin 325 mg per day.  5. Crestor 10 mg per day.  6. Atenolol 25 mg per day.  7. Enalapril 2.5 mg per day.   PHYSICAL EXAMINATION:  GENERAL:  He is generally well-appearing in no  distress.  VITAL SIGNS:  Heart rate 67, blood pressure 126/68, weight 185 pounds.  NECK:  He has no jugular venous distention, thyromegaly, or  lymphadenopathy.  LUNGS:  Clear to auscultation. Respiratory effort is normal.  HEART:  He has a nondisplaced point of maximal cardiac impulse. There is  a regular rate and rhythm without murmurs, rubs or gallops.  ABDOMEN:  Soft, nondistended, nontender. There is no hepatosplenomegaly.  Bowel sounds are normal.  EXTREMITIES:  Warm without clubbing, cyanosis, edema or ulceration.   Carotid pulses 2+ bilaterally without bruit. Femoral pulses 2+  bilaterally.   Electrocardiogram demonstrates normal sinus rhythm and is a normal EKG.   IMPRESSION/RECOMMENDATIONS:  1. Coronary disease:  Doing nicely. Bypass grafts  are all patent.      Chest pain and dyspnea are not related to any myocardial ischemia.  2. Hypercholesterolemia:  Continue Crestor.  3. Diabetes mellitus:  Followed per primary care physician.  4. History of stroke:  Continue aspirin as he has not tolerated      Aggrenox.  5. Cataracts:  Per ophthalmology.  6. Mild left ventricular systolic dysfunction:  Continue ACE inhibitor      and beta blocker.     Shannon Farber, MD  Electronically Signed    WED/MedQ  DD: 07/10/2006  DT: 07/10/2006  Job #: [id number]

## 2010-11-03 NOTE — Consult Note (Signed)
NAMEERCOLE, GEORG                 ACCOUNT NO.:  [id number]   MEDICAL RECORD NO.:  [id number]          PATIENT TYPE:  INP   LOCATION:  3111                         FACILITY:  [facility]   PHYSICIAN:  Gustavus Messing. Orlin Hilding, M.D.DATE OF BIRTH:  [birth year]   DATE OF CONSULTATION:  03/12/2005  DATE OF DISCHARGE:                                   CONSULTATION   REASON FOR CONSULTATION:  Code stroke, onset of symptoms at 2 p.m., it was  called in at 2:31 p.m.   CHIEF COMPLAINT:  Loss of vision.   HISTORY OF PRESENT ILLNESS:  Mr. Hippe is a 65 year old right handed white  male patient of internal medicine clinic with history of diabetes and  coronary artery disease status post CABG x 4 vessels Nov 13, 2004, or there  abouts.  During that hospitalization, he complained of some vision loss and  dizziness and had a CT scan of the brain done which was negative.  However,  since that time, he has complained of inability to read and poor vision.  He  has been on aspirin.  He was sitting on his porch around 2 p.m. when he had  a sudden total loss of vision which was improved to blurry by the time he  got to the emergency room, but complained of bilateral numbness.   REVIEW OF SYMPTOMS:  Positive for some chest pain which is resolved now.  He  had an emergency department visit for chest pain in June 2006 after his  CABG.   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   Medications at discharge Nov 10, 2004, were aspirin, Lopressor, Lipitor,  Ultram, Novolin, Lumigan.  It is unclear if any medicine changes have been  made since going to the medicine clinic here.   ALLERGIES:  No known drug allergies.   SOCIAL HISTORY:  He lives with his son who has schizophrenia.  He is a  remote smoker separated from his wife.  He does not drive secondary to  vision problems.   FAMILY HISTORY:  Positive for diabetes.   PHYSICAL EXAMINATION:  VITAL SIGNS:  Pulse 63, blood pressure 150/84, repeat 125/74, respirations  20.  HEENT:  Head normocephalic, atraumatic.  He has ill fitting and broken  dentures.  NECK:  Supple without bruits.  HEART:  Regular rate and rhythm.  NEUROLOGICAL:  His NIHSS score is 6.  Level of consciousness 1A, he is awake  and alert, he gets a 0, 1B he is oriented to age and date, he gets a 0, 1C  he follows two commands correctly, gets a 0.  2.  Best gaze, he has full  extraocular movements, gets a 0.  3.  Visual field.  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  He has a right mild facial paresis, gets a 1 for that.  On  the motor exam, he has no drift in either upper extremity, he gets 0 on 5A  and 5B.  There is a slight drift in the left lower extremity which is quite  mild, gets a 1.  He has no drift in the right lower extremity, 6B gets a 0.  There is no ataxia, he gets 0.  He has a partial right hemisensory loss,  gets a 1 for that.  He has no aphasia.  He has a mild dysarthria, gets a 1  for that.  There is no evidence of neglect.   CT of the brain shows an old right PCA stroke which is new since May 2006  but not acute, I suspect this happened around the time of his surgery.  There are scattered old small infarctions.  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  The  right vertebral appears to be widely patent.   LABORATORY DATA:  White blood cell count 8.6, hemoglobin 12.3, hematocrit  36, platelets 253.  Sodium 142, potassium 4.5, chloride 107, CO2 32, BUN 15,  creatinine 0.9, glucose 161, calcium 9.1, SGOT 20, SGPT 31, alkaline  phos  51, total bilirubin 0.8, protein 6.1, albumin 3.3.  PT 13.8, INR 1, PTT 35.   IMPRESSION:  Right facial droop and right hemisensory loss and dysarthria,  question new event suggesting left brain event, possibly brain stem.  He has  a four month old right PCA infarction which very likely occurred  perioperatively after the CABG that he had in May 2006.  It was hard to tell  new findings from old findings.   RECOMMENDATIONS:  Would give ITPA, meets criteria for that in terms of time  window and includes an exclusion criteria.  He is not a stroke study  candidate given that he is getting the t-PA.  He will need an MRI of the  brain and MR angiograms, he will need 2D echo, homocystine levels, lipids,  and management of this intercurrent medical problems by the primary medical  team.  The stroke service will follow.      Catherine A. Orlin Hilding, M.D.  Electronically Signed     CAW/MEDQ  D:  03/12/2005  T:  03/13/2005  Job:  [id number]

## 2010-11-03 NOTE — Discharge Summary (Signed)
NAMELAYLA, [name]                 ACCOUNT NO.:  [id number]   MEDICAL RECORD NO.:  [id number]          PATIENT TYPE:  INP   LOCATION:  3701                         FACILITY:  [facility]   PHYSICIAN:  Alvester Morin, M.D.  DATE OF BIRTH:  June 30, 1945   DATE OF ADMISSION:  03/12/2005  DATE OF DISCHARGE:  03/16/2005                                 DISCHARGE SUMMARY   DISCHARGE DIAGNOSES:  1.  Acute stroke--received TPA and symptoms improved.  2.  Diabetes mellitus, type 2.  3.  History of multiple strokes--multiple lacunar infarcts and large old      right occipital infarct.  4.  Coronary artery disease, status post coronary artery bypass graft, four      vessels in May 2006.  5.  Glaucoma.   DISCHARGE MEDICATIONS:  1.  Aggrenox 25/100 once daily for two weeks, then b.i.d. after that.  2.  Insulin 70/30, 22 units in the morning; insulin 70/30, 14 units in the      evening.  3.  Lisinopril 20 mg once daily.  4.  Lipitor 20 mg once daily.  5.  Xanax 0.25 mg once t.i.d.  6.  Cosopt one drop to right eye daily.   CONDITION ON DISCHARGE:  Patient's vision was improved from admission, but  slightly worse than his baseline. His neuro exam was without stable and  normal. He was eating well, urinating well, and having bowel movements.  Because he likely had a stroke on aspirin, he was started on Aggrenox. His  lipids and diabetes regimen were improved for maximum control and prevention  of future strokes. He will follow up with Dr. Lin Givens, his ophthalmologist on  October 11th at 9 a.m. He will follow up me at the outpatient clinic on  October 10th at 2:30 when he will have repeat CBC and BMET. He will also  follow up at [facility] here.   On March 12, 2005, CT angio of the had showed acute or subacute infarct.  On March 13, 2005, MRI and MRA of head showed multiple old lacunar  infarcts, large right occipital infarct with no new infarcts. There was no  evidence of carotid  disease. On March 13, 2005, PTE showed mild systolic  and diastolic dysfunction.   CONSULTATIONS:  Dr. Orlin Hilding and rehab.   HISTORY AND PHYSICAL:  [name] is a 65 year old with a history of prior  vision loss after CABG who presented with two hours of sudden vision loss  and bilateral hand numbness. Vital signs on admission were temperature of  98.7, pulse 53, blood pressure 150/84, respiratory rate 20.  On examination,  he had a right facial droop and right hemisensory loss with dysarthria. His  labs showed a white count of 8.6, hemoglobin 12.3, platelet count 253,000.  Sodium 142, potassium 4.5, chloride 107, CO2 23, BUN 15, creatinine 0.9,  glucose 161.   HOSPITAL COURSE:  CT of the head was performed that showed acute or subacute  infarct. The patient was given TPA for suspected stroke. Facial droop and  sensory loss and vision improved. After  24 hours the patient was started on  Aggrenox since he has had a new stroke on aspirin. During his  hospitalization, his neurologic exam stayed stable. His blood pressures were  kept under excellent control. His lipids were checked and he had an LDL of  79 and an HDL of 37, so Lipitor dose was increased for a goal LDL of less  than 70. A homocysteine level was checked and was normal. He is set up for  rehabilitation and home PT/OT.   His blood sugars remained elevated when he was admitted. His insulin was  increased based on his sliding scale needs. His hemoglobin A1C was found to  be 8.7, so he was discharged on a higher dose of insulin than he was  admitted with.   He was maintained on his home glaucoma medications. He will follow up with  his ophthalmologist.  He had complained of numbness in his bilateral hands. This improved when he  was restarted on his home dose of Xanax t.i.d.   At the time of discharge his vital signs were temperature of 98.2, pulse 64,  respiratory rate 20, blood pressure 126/70, saturation 96% on room  air. His  glucose was 134.  Labs showed white count of 14.5, hemoglobin 11.9, platelet  count 239,000. Sodium 142, potassium 3.9, chloride 109, CO2 26, BUN 11,  creatinine 0.8, glucose 114.  His exam on discharge was normal except for  vision loss.   PENDING LABORATORIES:  None.     ______________________________  Melburn Hake, A.I.      Alvester Morin, M.D.  Electronically Signed    SR/MEDQ  D:  03/18/2005  T:  03/18/2005  Job:  [id number]   cc:   Clois Dupes, M.D.  Fax: [phone]

## 2010-11-03 NOTE — Cardiovascular Report (Signed)
[name], [name]                 ACCOUNT NO.:  [id number]   MEDICAL RECORD NO.:  [id number]          PATIENT TYPE:  INP   LOCATION:  2920                         FACILITY:  [facility]   PHYSICIAN:  Salvadore Farber, M.D. LHCDATE OF BIRTH:  01/25/1946   DATE OF PROCEDURE:  11/08/2004  DATE OF DISCHARGE:                              CARDIAC CATHETERIZATION   PROCEDURE:  Left heart catheterization, left ventriculography, coronary  angiography, abdominal aortography, placement of intra-aortic counter-  pulsation balloon.   INDICATIONS:  [name] is a 65 year old gentleman with long-standing  diabetes mellitus who presents with chest discomfort for 24 hours. He has  diffuse ST depressions on electrocardiogram. He has ruled in for myocardial  infarction with CPK of 324 and a CK-MB of 22. He is brought urgently to the  cardiac catheterization lab with ongoing 5/10 substernal chest discomfort.   PROCEDURAL TECHNIQUE:  Informed consent was obtained. Under 1% lidocaine  local anesthesia, a 6-French sheath was placed in the right common femoral  arteries using the modified Seldinger technique. Diagnostic angiography and  ventriculography were performed using JL4, JR4, and pigtail catheters.  Pigtail catheter was then pulled back to the suprarenal abdominal aorta.  Abdominal aortography was performed by power injection. Through the  procedure, the patient had 2 to 3/10 chest pain. Due to this and his severe  left main stenosis, I placed a 40-cc intra-aortic balloon pump via the right  groin access in sheathless fashion. Counter-pulsation was initiated at 1:1.  He was then transferred back to the cardiac intensive care unit with no  further chest discomfort and hemodynamically stable.   COMPLICATIONS:  None.   FINDINGS:  1.  Left ventricle:  116/8/24. Ejection fraction 52% with anterolateral      hyperkinesis.  2.  No aortic stenosis or mitral regurgitation.  3.  Left main:  There is a 90%  stenosis of the distal vessel extending into      the origins of the left anterior descending, ramus, and circumflex.  4.  Left anterior descending:  Moderate sized vessel giving rise to a single      fairly small diagonal. There is an ostial 90% stenosis. There has been a      long 70% stenosis of the mid vessel. The distal vessel does appear to be      an adequate target for bypass grafting.  5.  Ramus intermedius:  Moderate to large vessel. There is a proximal 90%      stenosis.  6.  Circumflex:  Small vessel giving rise to a single small obtuse marginal.      There is a 90% stenosis at its ostium.  7.  Right coronary artery:  Very large, dominant vessel. There is a 30%      stenosis proximally and a 20% stenosis distally prior to the take off to      the PDA. A large posterior left ventriculogram shows a 99% stenosis with      TIMI-2 flow. There are relatively faint right to left collaterals.   IMPRESSION/RECOMMENDATIONS:  The patient has severe multi-vessel coronary  disease with mild impairment of the left ventricular systolic function. I  have discussed the case with Dr. Laneta Simmers who will plan on coronary artery  bypass grafting this afternoon.      WED/MEDQ  D:  11/08/2004  T:  11/08/2004  Job:  [id number]   cc:   Janae Bridgeman. Eloise Harman., M.D.  [address]  [place]  [place] [address]  Fax: [phone]

## 2010-11-03 NOTE — Assessment & Plan Note (Signed)
[facility]                              CARDIOLOGY OFFICE NOTE   [name], [name]                        MRN:          [id number]  DATE:05/07/2006                            DOB:          July 17, 1945    PRIMARY CARE PHYSICIAN:  Dr. Lendell Caprice, [facility].   HISTORY OF PRESENT ILLNESS:  [name] is a 65 year old gentleman with  atherosclerotic coronary disease.  He presented with non-ST elevation  myocardial infarction in May of 2006.  He underwent emergent coronary artery  bypass grafting by Dr. Laneta Simmers for severe left main stenosis accompanied by  proximal disease in LAD, ramus intermedius and disease in the diagonal and a  marginal.  The proximal right was free of significant disease though the  right PDA was diffusely diseased.  Ejection fraction was 52%.  Revascularization was complicated by an occipital stroke.   Since that presentation, [name] has continued to do poorly.  He continues  to complain of chest pain occurring both at rest and exertion.  These  symptoms have waxed and waned over time and sometimes have been solely at  rest.  Assessment of adenosine Cardiolite in May demonstrated very mild peri-  infarct ischemia and an ejection fraction of 37%.   [name] and his son come in again today.  They are convinced that he  continues to do very poorly.  He has no energy.  They continue to remain  extremely concerned that his intermittent chest discomfort represents  continued ischemia.   PAST MEDICAL HISTORY:  1. Atherosclerotic coronary disease, status post non-ST elevation      myocardial infarction in 2006, EF 37%.  2. Diabetes mellitus since 1982.  3. Glaucoma.  4. Prior occipital infarct.   ALLERGIES:  NKDA.   CURRENT MEDICATIONS:  1. 70/30 insulin.  2. Lumigan eye drops.  3. Multivitamin.  4. Aspirin 325 mg per day.  5. Crestor 10 mg per day.  6. Atenolol 25 mg per day.  7. Enalapril 2.5 mg per  day.   SOCIAL HISTORY:  The patient lives in [place].  He is separated from his wife.  He lives with his son who has schizophrenia but seems to cope very well.  He  is quite helpful at office visits.  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  Quit smoking in mid 1990s after a 40-50 pk/yr  history.  Denies alcohol and illicit drug use.   FAMILY HISTORY:  Father died of complications of diabetes in his 32s.   REVIEW OF SYSTEMS:  Waxing and waning visual difficulty which is getting  chronic over 18 months.  Otherwise negative in detail except as above.   PHYSICAL EXAMINATION:  On physical examination, he is generally well-  appearing, in no distress, with heart rate of 57, blood pressure 110/68 and  weight of 181 pounds.  He has no jugular venous distention, thyromegaly,  lymphadenopathy.  Respiratory effort is normal.  Lungs are clear to  auscultation.  He has a non-displaced point of maximal cardiac impulse.  There is a regular rate and rhythm, without murmurs, rubs or gallops.  Abdomen is soft, nondistended, nontender.  There is no hepatosplenomegaly.  Bowel sounds are normal.  Extremities are warm, without clubbing, cyanosis,  edema, ulceration.  Carotid pulses 2+ bilaterally without bruit.  Femoral  pulses 2+ bilaterally without bruit.  He is alert and oriented x3 with  somewhat flat affect which is usual for him.   Electrocardiogram today demonstrates sinus bradycardia at 57 BPM with old  inferior infarct.  No change compared to prior.   IMPRESSION/RECOMMENDATIONS:  1. Atherosclerotic coronary disease.  The patient and his son are fairly      convinced that his symptoms are attributable to his cardiac disease.  I      have been reassured by his stress test and the atypical nature of his      symptoms.  However, he is clearly doing poorly.  After lengthy      discussion with the patient and his son, we will proceed to cardiac      catheterization for  definitive reassessment of his myocardial perfusion      and reassessment of his left ventricular systolic function.  We will      also perform right heart catheterization at that time to assess filling      pressures.  We will make medication adjustments based upon finding      there.  Risks, including bleeding, infection, vascular injury,      myocardial infarction, recurrent stroke, and even death, were explained      to the patient and his son.  They voiced understanding and agree to      proceed.  2. Hypercholesterolemia.  Continue Crestor.  Check fasting lipids and LFTs      today.  3. Diabetes mellitus.  Followed by primary care physician.  4. History of stroke.  Continue aspirin as he has not tolerated Aggrenox.  5. Cataracts.  Per ophthalmology.  6. LV systolic dysfunction.  Continue Enalapril and Atenolol.  We will      consider switching to Coreg now that it is available inexpensively.     Salvadore Farber, MD  Electronically Signed    WED/MedQ  DD: 05/12/2006  DT: 05/12/2006  Job #: [id number]

## 2010-11-03 NOTE — Op Note (Signed)
[name], [name]                 ACCOUNT NO.:  [id number]   MEDICAL RECORD NO.:  [id number]          PATIENT TYPE:  INP   LOCATION:  2304                         FACILITY:  [facility]   PHYSICIAN:  Evelene Croon, M.D.     DATE OF BIRTH:  [birth year]   DATE OF PROCEDURE:  11/08/2004  DATE OF DISCHARGE:                                 OPERATIVE REPORT   PREOPERATIVE DIAGNOSIS:  High-grade left main and severe three-vessel  coronary artery disease, status post non-Q-wave myocardial infarction and  unstable angina.   POSTOPERATIVE DIAGNOSIS:  High-grade left main and severe three-vessel  coronary artery disease, status post non-Q-wave myocardial infarction and  unstable angina.   OPERATIVE PROCEDURE:  1.  Emergency median sternotomy, extracorporeal circulation, coronary bypass      graft surgery x4 using a left internal mammary artery graft to the left      anterior descending coronary, with a saphenous vein graft to the      diagonal branch of the left anterior descending, and a sequential      saphenous vein graft to the intermediate branch of the left circumflex      coronary and the posterolateral branch of the right coronary.  2.  Endoscopic vein harvesting from the right leg.   ATTENDING SURGEON:  Evelene Croon, M.D.   ASSISTANT:  Rowe Clack, P.A.-C.   ANESTHESIA:  General endotracheal.   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  He ruled in for a non-Q-wave myocardial infarction with  a peak CPK of 346 and an MB of 22.6.  His peak troponin was 9.28.  The  electrocardiogram showed ST depression anterolaterally.  He was taken to the  catheterization lab this morning by Dr. Samule Ohm and this showed 90% distal  left main stenosis extending into the ostium of the LAD.  There is a 90%  ostial LAD.  The LAD appeared to be occluded after a large branching  diagonal vessel.  The diagonal itself had about 70% stenosis.  The left  circumflex gave off a moderate-sized intermediate branch that had 90%  proximal stenosis and the distal left circumflex was a small vessel.  The  right coronary artery was a large vessel that had about 30% mid-vessel  narrowing.  There is a posterior descending branch that was diffusely  diseased, especially in its mid and distal portions.  There was a large  posterolateral branch that was essentially occluded and filling by antegrade  collaterals.  Left ventricular ejection fraction was about 52%.  There is  anterolateral hypokinesis.  An intra-aortic balloon pump was placed in the  catheterization lab due to ongoing chest pain.  The patient stabilized on  heparin and Integrilin, and was taken to the CCU.  After review of the  angiogram and examination the patient, it was felt that proceeding with  emergency coronary bypass surgery was the best treatment to prevent further  ischemia and infarction.  I discussed the operative procedure with  the  patient and his sister including alternatives, benefits, and risks including  bleeding, blood transfusion, infection, stroke, myocardial infarction, graft  failure, and death.  They understood and agreed to proceed.   OPERATIVE PROCEDURE:  The patient was taken to the operating room and placed  on the table in a supine position.  After induction of general endotracheal  anesthesia, a Foley catheter was placed in the bladder using sterile  technique.  Then the chest, abdomen and both lower extremities were prepped  and draped in usual sterile manner.  The chest was entered through a median  sternotomy incision and the pericardium opened in the midline.  Examination  of the heart showed good ventricular contractility.  The heart was enlarged.  The ascending aorta had no palpable plaques in it.   Transesophageal echocardiogram was performed by Anesthesiology.  This showed  a  dilated left ventricle that contracted fairly well.  There was some  hypokinesis of the anterolateral wall.  There was 1 to 2+ mitral  regurgitation.   Then the left internal mammary artery was harvested from the chest wall as a  pedicle graft.  This was a medium-caliber vessel with excellent blood flow  through it.  At the same time, a segment of greater saphenous vein was  harvested from the right leg using endoscopic vein harvest technique.  This  vein was of medium size and good quality.   Then the patient was heparinized and when an adequate activated clotting  time was achieved, the distal ascending aorta was cannulated using a 20-  [place] aortic cannula for arterial inflow.  Venous outflow was achieved  using a two-staged venous cannula through the right atrial appendage.  An  antegrade cardioplegia and vent cannula was inserted in the aortic root.   The patient was placed on cardiopulmonary bypass and distal coronaries  identified.  The LAD was intramyocardial along most of its extent.  It was  only visible near the apex.  It was a relatively small vessel at the apex.  I was able to locate the LAD just beyond the takeoff of the diagonal branch.  It was a medium-sized graftable vessel that was free of disease in this  area.  The diagonal itself was heavily diseased proximally, but was a good  vessel distally.  The intermediate was a medium-sized graftable vessel.  The  distal left circumflex was a small and non-graftable.  The right coronary  artery had some segmental plaque throughout it.  The posterior descending  branch was diffusely diseased and this had calcified plaque that extended  into the distal vessel.  I did not feel that this would be suitable for  grafting.  The posterolateral branch was a large vessel that was diseased  proximally, but a good graftable vessel distally.  Then the aorta was cross-clamped and 500 mL of cold blood antegrade  cardioplegia were  administered in the aortic root with quick arrest of the  heart.  Systemic hypothermia to 20 degrees centigrade and topical  hypothermia with iced saline was used.  A temperature probe was placed in  the septum and an insulating pad in the pericardium.   The first distal anastomosis was performed to the intermediate coronary.  The internal diameter of this vessel was about 1.75 mm.  The conduit that  was used was a segment of greater saphenous vein and the anastomosis  performed in a sequential side-to-side manner using continuous 7-0 Prolene  suture.  Flow was measured through the  graft and was excellent.   The second distal anastomosis was performed to the posterolateral branch of  the right coronary artery.  The internal diameter of this vessel was also  about 1.75 mm.  The conduit that was used was the same segment of greater  saphenous vein and anastomosis performed in a sequential end-to-side manner  using continuous 7-0 Prolene suture.  Flow was noted through the graft and  was excellent.  Then another dose of cardioplegia was given down the vein  graft and the aortic root.   The third distal anastomosis was formed to the diagonal branch.  The  internal diameter of this vessel was about 1.75 mm.  The conduit that was  used was a second segment of greater saphenous vein and the anastomosis  performed in an end-to-side manner using continuous 7-0 Prolene suture.  Flow was noted through the graft and was excellent.   The fourth distal anastomosis was performed to the mid-portion of the left  anterior descending coronary artery.  The internal diameter was about 1.6  mm.  The conduit that was used was a left internal mammary graft and this  was brought through an opening in the left pericardium and anterior to the  phrenic nerve.  It was anastomosed to the LAD in an end-to-side manner using  continuous 8-0 Prolene suture.  The pedicle was tacked to the epicardium  with 6-0 Prolene  sutures.   Then another dose of cardioplegia was given in the aortic root and down the  vein grafts.  With the crossclamp in place, the 3 proximal vein graft  anastomoses were performed to the aortic root in an end-to-side manner using  continuous 6-0 Prolene suture.  Then the clamp was removed from the mammary  pedicle.  There was rapid warming of the ventricular septum and return of  spontaneous ventricular fibrillation.  The crossclamp removed with a time of  63 minutes and the patient spontaneously converted to sinus rhythm.  The  proximal and distal anastomoses appeared hemostatic and lines of the graft  satisfactory.  Graft markers were placed around the proximal anastomoses.  Two temporary right ventricular and right atrial pacing wires were placed  and brought out through the skin.   When the patient had rewarmed 37 centigrade, he was weaned from cardiopulmonary bypass on low-dose dopamine.  Total bypass time was 81  minutes.  Cardiac function appeared excellent with a cardiac output of 5 L  per minute.  The intra-aortic balloon pump was started a 1:3 ratio.  Protamine was then given and the venous and aortic cannulas were removed  without difficulty.  Hemostasis was achieved.  Three chest tubes were placed  with 2 in the posterior pericardium, 1 in the left pleural space and 1 in  the anterior mediastinum.  The pericardium was reapproximated over the  heart.  The sternum was closed with #6 stainless steel wires.  The fascia  was closed with a continuous #1 Vicryl suture.  The subcutaneous tissue was  closed with a continuous 2-0 Vicryl and the skin with 3-0 Vicryl  subcuticular closure.  The lower extremity vein harvest site was closed in  layers in a similar manner.  The sponge, needle and instrument counts were  correct according to the scrub nurse.  Dry sterile dressings were applied  and the incisions and around the chest tubes, which were hooked to Pleur-  evac suction.   The patient remained hemodynamically stable and was  transported to the SICU  in guarded but stable condition.       BB/MEDQ  D:  11/08/2004  T:  11/09/2004  Job:  [id number]   cc:   Salvadore Farber, M.D. [facility]  [address]  [address]  [place]  [place] [address]   [facility]

## 2010-11-03 NOTE — Cardiovascular Report (Signed)
NAMEJONATHON, [name]                 ACCOUNT NO.:  [id number]   MEDICAL RECORD NO.:  [id number]          PATIENT TYPE:  OIB   LOCATION:  2899                         FACILITY:  [facility]   PHYSICIAN:  Bevelyn Buckles. Bensimhon, MDDATE OF BIRTH:  1946/06/06   DATE OF PROCEDURE:  05/15/2006  DATE OF DISCHARGE:                            CARDIAC CATHETERIZATION   PRIMARY CARE PHYSICIAN:  Janae Bridgeman. Lendell Caprice, M.D.   CARDIOLOGIST:  Salvadore Farber, M.D.   PATIENT IDENTIFICATION:  [name] is a very pleasant 65 year old male  with a history of coronary artery disease, status post bypass surgery in  [id number].  Unfortunately, he has continued to have dyspnea and ongoing  chest pain both at rest and with stress.  He had a Myoview which showed  an EF of 37% but no ischemia.  There was an inferolateral scar.  Given  his ongoing symptoms, he is referred for a right and left heart  catheterization.   PROCEDURES PERFORMED:  1. Right heart catheterization with simultaneous RV and LV catheters      as well as oximetry run.  2. Left heart catheterization.  3. Selective coronary angiography.  4. Saphenous vein graft angiography x2.  5. LIMA angiography.  6. AngioSeal femoral closure.   DESCRIPTION OF PROCEDURE:  The risks and benefits of catheterization  were explained.  Consent was signed and placed on the chart.  A 7-French  venous sheath was placed in the right femoral vein using a modified  Seldinger technique.  A standard Swan-Ganz catheter was used for the  right heart catheterization.  We did need the assistance of an 025 wire  to help guide the Swan into the SVC during the oximetry run.   A 6-French arterial sheath was placed in the right femoral artery.  Standard catheters including JL-4, JR-4, and angled pigtail, as well as  an internal mammary catheter was used for the procedure.  All catheter  exchanges made over a wire.  There were no apparent complications.   HEMODYNAMIC RESULTS:  1.  Right atrial pressure mean of 2, RV 33/3, with an EDP of 3.  2. PA pressure 32/13 with a mean of 22.  3. Pulmonary capillary wedge pressure mean of 13 with V-waves to 20-      25.  4. Left LV pressure 152/6, an EDP of 20.  5. Central aortic pressure 163/67 with a mean of 104.  There is no      aortic stenosis.  6. Fick cardiac output was 4.5 liters per minute.  Cardiac index was      2.2 liters per minute per meter squared.  7. Pulmonary vascular resistance was 2.0 Woods units.   SATURATIONS:  On room air, SVC superior vena cava 64%, RV was 75% right  pulmonary artery was 64%, left pulmonary artery was 72% and 71%.  Femoral artery was 97%.   There is no evidence of RV LV interaction, both before and after a 400  mL IV fluid load.   1. Left main was a long vessel.  It had a tubular 50% stenosis prior  to a 90% distal stenosis which extended into the ostium of the LAD,      ramus, and circumflex.  2. LAD was totally occluded ostially.  3. Left circumflex had a 90% lesion in the [place].  The ramus was      totally occluded proximally.  There was a small to moderate OM-1,      and a small OM-2, and then the left circumflex was totally occluded      in the AV groove.  4. Right coronary was a large dominant vessel.  It gave off moderate-      sized PDA and several small posterolateral branches.  There were      also faint right-to-left collaterals to the LAD.  In the proximal      portion of the RCA there was a 50% tubular lesion and there was a      40% lesion distally prior to the PDA.  5. Saphenous vein graft to the diagonal was widely patent with a 20%      lesion in the body of the graft.  6. Saphenous vein sequential graft to the ramus and the left      posterolateral was widely patent.  7. LIMA to the LAD was widely patent.  There was moderate diffuse      disease in the distal LAD but no focal lesions.   LEFT VENTRICULOGRAM:  Done in the RAO position showed an EF of 51%  with  inferior hypokinesis.  There was no evidence of significant mitral  regurgitation though the ventricle was somewhat under filled   ASSESSMENT:  1. Severe native three-vessel coronary artery disease.  2. All grafts patent.  3. Normal right heart pressures with mildly elevated left ventricular      end diastolic pressure.  4. No evidence of constrictive physiology on simultaneous right and      left heart catheterization.  5. Minimal step-up from the superior vena cava to the right ventricle      on oximetry run which is suggestive of a very small potential      intracardiac left-to-right shunt.  Low normal left ventricular      function.   PLAN/DISCUSSION:  Overall the etiology of his symptoms remain fairly  unclear to me.  I do not see an answer for this based on his  catheterization results.  At this point, I would continue his medical  therapy and consider possible cardiac rehabilitation.      Bevelyn Buckles. Bensimhon, MD  Electronically Signed     DRB/MEDQ  D:  05/15/2006  T:  05/15/2006  Job:  [id number]   cc:   Janae Bridgeman. Eloise Harman., M.D.  Salvadore Farber, MD

## 2010-11-03 NOTE — Discharge Summary (Signed)
NAMEHEATON, SARIN                 ACCOUNT NO.:  [id number]   MEDICAL RECORD NO.:  [id number]          PATIENT TYPE:  INP   LOCATION:  2001                         FACILITY:  [facility]   PHYSICIAN:  Evelene Croon, M.D.     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   SECONDARY DIAGNOSES:  1.  Diabetes mellitus.  2.  Glaucoma.   OPERATION/PROCEDURE:  1.  Cardiac catheterization with a left heart catheterization, left      ventriculography, coronary angiography, abdominal aortography, placement      of intra-aortic counterpulsation balloon.  2.  Emergency coronary artery bypass grafting x4 with a left internal      mammary artery to the left anterior descending coronary, saphenous vein      graft to the diagonal branch of the left anterior descending and a      sequential saphenous vein graft to the intermediate branch of the left      circumflex coronary and the posterolateral branch of the right coronary.      Noted endoscopic vein harvesting from the right leg was done.   HISTORY AND PHYSICAL AND HOSPITAL COURSE:  Mr. Bluestein is a 65 year old  gentleman, who has no prior cardiac history.  He does have a past medical  history of poorly controlled diabetes mellitus.  This is followed by Dr.  Lendell Caprice.  The patient has been having some intermittent chest pains for the  past several weeks, which he associated with indigestion.  On Nov 08, 2004,  he woke up in the a.m. complaining of chest pain.  It then continued to  progress through the morning.  He had associated nausea, as well as  shortness of breath and diaphoretic.  He had radiation to the left arm.  The  patient then presented to [facility] later that evening.  EKG showed  septal T  segment depression in V1 and AVL and V3 through V6.  The patient  was treated with aspirin, heparin, nitroglycerin paste and transferred to  [facility].  The patient was taken to the cardiac cath lab on admission.  He was  seen to have an ejection fraction above 52% with anterolateral hypokinesis.  There was a 90% stenosis of the distal vessel of the left main extending  into the origins of the left anterior descending ramus and circumflex.  The  LAD had an ostial 90% stenosis.  There is a long 70% stenosis in the mid  vessel.  Ramus intermedius had a proximal 90% stenosis.  Circumflex had a  90% stenosis at its ostium.  RCA had a 30% proximal, 20% distal stenosis.  Large posterior left ventriculogram showed 99% stenosis with TIMI II flow.  Dr. Laneta Simmers was then consulted.  Noted following the catheterization, the  patient still had slight chest pain and due to left main stenosis, Dr.  Downey placed a 40 cc intra-aortic balloon pump.  Dr. Laneta Simmers saw and  evaluated patient.  He discussed with patient and family undergoing emergent  coronary artery bypass grafting for the severe left main disease, as well as  three vessel disease.  The patient and family agreed and wished to proceed.   For details of the patient's past medical history and physical exam, please  see dictated history and physical.   The patient was taken to the operating room on Nov 08, 2004 and underwent an  emergency coronary artery bypass grafting x4 with the left internal mammary  artery to left anterior descending, saphenous vein graft to the diagonal  branch of left anterior descending, sequential saphenous vein graft to the  intermediate branch of the left circumflex, coronary and posterolateral  branch of the right coronary.  Noted endoscopic vein harvesting from the  right leg was done.  The patient tolerated this procedure well and was  transferred up to the intensive care unit in stable condition.  Following  surgery, the  patient seemed to be hemodynamically stable.  The patient was  extubated later evening, early morning following the surgery.  Postoperative  day #1, the patient was seen to be doing well.  He remained hemodynamically  stable in normal sinus rhythm.  The patient is afebrile.  Chest x-ray was  within normal limits.  On postoperative day #1, the intraaortic balloon pump  was discontinued, as well as all chest tubes and Swan.  The patient  tolerated the removal of the intraaortic balloon pump well.  On  postoperative day #2, the patient continued to progress well.  Noted  diabetic management was consulted for further care of his diabetes mellitus.  I saw and evaluated patient.  Felt the patient probably needed 35-40 units  of Lantus at night.  The patient was placed on 70/30 insulin and continued  sliding scale.  On postoperative day #2, the patient had an episode of  dizziness.  He then fell on his buttocks.  A CT of the head was ordered  without contrast.  This showed to be negative for any acute CVA.  Postoperative day #3, the patient was complaining of film over eyes, which  had started early morning.  It did improve slightly during the morning.  The  patient was out of bed ambulating well.  His diabetes was slightly better  controlled and monitored closely.  Incisions are dry and intact and healing  well.  Later in the evening, the patient had a hypoglycemic episode with  blood glucose level of 75.  He received glucose two p.o.  The patient felt  better following this.  It was rechecked later and seen to be a CBG of 133.  Postoperative day #4, the patient was feeling better without complaints.  He  is out of bed ambulating well.  Sternal incisions are dry and intact and  healing well.  Lungs are clear bilaterally.  Heart was normal sinus rhythm.  Diabetes was better controlled.  The last white blood cell count was 16.4 on postoperative day #3 and will be rechecked prior to discharge.   Social  worker was consulted postoperative day #4 due to financial issues.  On  postoperative day #5, the patient complained of some wooziness in the a.m.  He stated this was related to low blood sugar.  The patient ate breakfast  and was feeling better.  His heart rate was normal sinus rhythm.  Sternal  incisions are dry and  intact and healing well.  He was out of bed ambulating  well.  Blood sugars were better controlled.  Mr. Thang was discharged to  home on postoperative day #5 in stable condition.  Social worker will  evaluate patient prior to discharge for financial issues.  A followup  appointment will be scheduled by Dr. Laneta Simmers in three weeks.  The office will  contact the patient.  The patient will see Dr. Sherlyn Lick in two weeks.  He is to  contact this doctor for his appointment.  A follow up PA and lateral chest x-  ray at this appointment and then bring with him to Dr. Sharee Pimple appointment.  Mr. Ganas received instructions on diet, activity level and incisional care.  He was told no driving until released to do so, no heavy lifting over 10  pounds.  He was told he is allowed to shower washing his incisions with soap  and water.  He is to contact the office if he develops any drainage or  opening from any of his incision sites.  The patient acknowledges  understanding.  He is told to ambulate three to four times per day and to  continue using his breathing exercises.  His diet is to be low fat, low salt  and carbohydrate modified medium calorie diet as well.  The patient is to  contact Dr. Pincus Sanes office, follow up with Dr. Lendell Caprice in one to two  weeks for management of diabetes mellitus.   DISCHARGE MEDICATIONS:  1.  Aspirin 325 mg p.o. daily.  2.  Lopressor 25 mg p.o. b.i.d.  3.  Lipitor 80 mg p.o. daily.  4.  Ultram 50 mg one to two tabs p.o. q.4h. p.r.n.  5.  Novolin 70/30, 35 units in the a.m., 25 units in p.m.  6.  Lumigan eye drops home dose.       KMD/MEDQ  D:   11/13/2004  T:  11/13/2004  Job:  [id number]

## 2010-11-03 NOTE — H&P (Signed)
NAMEHAU, SANOR                 ACCOUNT NO.:  [id number]   MEDICAL RECORD NO.:  [id number]          PATIENT TYPE:  INP   LOCATION:  2920                         FACILITY:  [facility]   PHYSICIAN:  Rollene Rotunda, M.D.   DATE OF BIRTH:  [birth year]   DATE OF ADMISSION:  11/08/2004  DATE OF DISCHARGE:                                HISTORY & PHYSICAL   The primary is Dr. Lendell Caprice, [facility], [address]; cardiologist,  Harl Bowie, M.D.   REASON FOR PRESENTATION:  Evaluate the patient for chest pain and elevated  troponins.   HISTORY OF PRESENT ILLNESS:  The patient is a 65 year old gentleman who has  no prior cardiac history.  He has longstanding apparently poorly-controlled  diabetes.  He had been having some intermittent chest pain for a few weeks  that he thought was indigestion.  This morning while at work, he developed  chest discomfort.  It progressed, starting at 8 a.m.  It eventually became  10/10.  He was mildly nauseated but did not throw up.  He was perhaps  slightly short of breath and sweaty.  This was intermittent through the day.  There was some radiation to the left side of his jaw and to his left arm.  He finally presented to [facility] at about 8 p.m.  There he did not  initially have EKG changes, though follow-up showed some subtle ST segment  depression in I and aVL and V3 through V6.  He was treated with aspirin,  heparin, nitroglycerin paste, and was subsequently found to have elevated  cardiac enzymes.  He was transferred to [facility], where his pain is currently  1/10.   PAST MEDICAL HISTORY:  Diabetes mellitus since 1982, glaucoma.   PAST SURGICAL HISTORY:  None.   ALLERGIES:  None.   MEDICATIONS:  Insulin, multivitamin, calcium, __________.   SOCIAL HISTORY:  The patient lives in [place].  He is separated from his wife.  He lives with a son, who has schizophrenia.  He is a Corporate investment banker for  20 years.  He has a 40-50 pack-year history and  quit five to seven years  ago.   FAMILY HISTORY:  Contributory for his father dying of complications of  diabetes in his 20s.   REVIEW OF SYSTEMS:  As stated in the HPI.  Positive nocturia, numbness in  his arms.  Negative for all other systems.   PHYSICAL EXAMINATION:  GENERAL:  The patient is in no distress.  VITAL SIGNS:  Blood pressure 123/63, heart rate 71 and regular, afebrile,  96% oxygen saturation on 2 L.  HEENT:  Eyes unremarkable, pupils equal, round, and reactive to light, fundi  not visualized.  Oral mucosa:  Upper edentulous, otherwise unremarkable.  NECK:  No jugular venous distention at 45 degrees, carotid upstroke brisk  and symmetric, no bruits or thyromegaly.  LYMPHATIC:  No cervical, axillary or inguinal adenopathy.  CHEST:  Lungs clear to auscultation and percussion bilaterally.  Chest  unremarkable.  BACK:  No costovertebral angle tenderness.  CARDIAC:  PMI not displaced or sustained, S2 and S2 within normal  limits, no  S3, no S4, no murmurs, no rubs.  ABDOMEN:  Flat, positive bowel sounds, normal in frequency and pitch, no  bruits, no guarding, no midline pulsatile mass, no hepatomegaly or  splenomegaly.  SKIN:  No rashes, no nodules.  EXTREMITIES:  2+ pulses throughout, no edema, no cyanosis, no clubbing, no  bruits.  NEUROLOGIC:  Oriented to person, place and time.  Cranial nerves II-XII  grossly intact.  Motor grossly intact.   EKG:  Sinus rhythm, rate 73, axis within normal limits, intervals within  normal limits, no acute ST wave changes.   LABORATORY DATA:  WBC 8.4, hemoglobin 12, platelets 249.  Sodium 133,  potassium 4.2, BUN 18, creatinine 0.8, glucose 498.  CK peak 187, MB 13.5,  troponin 1.86.  INR 1.   Chest x-ray:  Prominent interstitial markings.   ASSESSMENT AND PLAN:  1.  Acute coronary syndrome patient who presents with symptoms and enzymes      consistent with acute coronary syndrome.  He is currently having some      very mild chest  discomfort.  Will switch him from Nitrol paste to IV      nitroglycerin.  Will start a low-dose beta blocker and continue aspirin,      heparin and Integrilin.  He will be taken to cardiac catheterization      lab.  Risks and benefits have been described.  The patient agrees to      proceed.  2.  Diabetes.  He will need extensive education and possibly some oral      agents to complement his insulin.  Will hold on any Glucophage pending      cardiac catheterization.  Will cover him with sliding scale insulin.  3.  Risk reduction.  Will check a fasting lipid profile.  Will check TSH.      JH/MEDQ  D:  11/08/2004  T:  11/08/2004  Job:  [id number]   cc:   Harl Bowie, M.D.  [address]  [place]  [place] [address]  Fax: [phone]   Dr. Dorthy Cooler, [facility]

## 2010-12-22 ENCOUNTER — Encounter: Payer: Self-pay | Admitting: Internal Medicine

## 2010-12-25 ENCOUNTER — Ambulatory Visit: Payer: Self-pay | Admitting: Internal Medicine

## 2011-03-16 LAB — GLUCOSE, CAPILLARY
Glucose-Capillary: 205 — ABNORMAL HIGH
Glucose-Capillary: 249 — ABNORMAL HIGH
Glucose-Capillary: 268 — ABNORMAL HIGH

## 2011-03-16 LAB — CBC
HCT: 34.9 — ABNORMAL LOW
Hemoglobin: 11.7 — ABNORMAL LOW
WBC: 9.4

## 2011-03-16 LAB — COMPREHENSIVE METABOLIC PANEL
Alkaline Phosphatase: 97
BUN: 20
Chloride: 105
Glucose, Bld: 310 — ABNORMAL HIGH
Potassium: 4.8
Total Bilirubin: 0.8

## 2011-03-16 LAB — DIFFERENTIAL
Basophils Relative: 2 — ABNORMAL HIGH
Eosinophils Absolute: 0.3
Lymphs Abs: 1.9
Neutro Abs: 6.4
Neutrophils Relative %: 68

## 2011-03-16 LAB — PROTIME-INR
INR: 0.9
Prothrombin Time: 12.2

## 2011-03-16 LAB — APTT: aPTT: 22 — ABNORMAL LOW

## 2011-03-29 LAB — POCT CARDIAC MARKERS
CKMB, poc: 7.8
Myoglobin, poc: 147
Troponin i, poc: <0.05

## 2011-03-29 LAB — I-STAT 8, (EC8 V) (CONVERTED LAB)
Bicarbonate: 24.4 — ABNORMAL HIGH
Glucose, Bld: 313 — ABNORMAL HIGH
TCO2: 26
pH, Ven: 7.385 — ABNORMAL HIGH

## 2012-05-22 ENCOUNTER — Encounter (HOSPITAL_BASED_OUTPATIENT_CLINIC_OR_DEPARTMENT_OTHER): Payer: Self-pay

## 2012-06-19 ENCOUNTER — Encounter (HOSPITAL_BASED_OUTPATIENT_CLINIC_OR_DEPARTMENT_OTHER): Payer: Self-pay

## 2014-12-10 DIAGNOSIS — Z13220 Encounter for screening for lipoid disorders: Secondary | ICD-10-CM | POA: Diagnosis not present

## 2014-12-10 DIAGNOSIS — E1169 Type 2 diabetes mellitus with other specified complication: Secondary | ICD-10-CM | POA: Diagnosis not present

## 2014-12-10 DIAGNOSIS — Z125 Encounter for screening for malignant neoplasm of prostate: Secondary | ICD-10-CM | POA: Diagnosis not present

## 2014-12-10 DIAGNOSIS — L97419 Non-pressure chronic ulcer of right heel and midfoot with unspecified severity: Secondary | ICD-10-CM | POA: Diagnosis not present

## 2014-12-13 DIAGNOSIS — E1169 Type 2 diabetes mellitus with other specified complication: Secondary | ICD-10-CM | POA: Diagnosis not present

## 2014-12-13 DIAGNOSIS — L97419 Non-pressure chronic ulcer of right heel and midfoot with unspecified severity: Secondary | ICD-10-CM | POA: Diagnosis not present

## 2014-12-13 DIAGNOSIS — Z125 Encounter for screening for malignant neoplasm of prostate: Secondary | ICD-10-CM | POA: Diagnosis not present

## 2014-12-13 DIAGNOSIS — Z13220 Encounter for screening for lipoid disorders: Secondary | ICD-10-CM | POA: Diagnosis not present

## 2014-12-21 DIAGNOSIS — R972 Elevated prostate specific antigen [PSA]: Secondary | ICD-10-CM | POA: Diagnosis not present

## 2014-12-21 DIAGNOSIS — L97419 Non-pressure chronic ulcer of right heel and midfoot with unspecified severity: Secondary | ICD-10-CM | POA: Diagnosis not present

## 2014-12-21 DIAGNOSIS — E539 Vitamin B deficiency, unspecified: Secondary | ICD-10-CM | POA: Diagnosis not present

## 2014-12-21 DIAGNOSIS — D7589 Other specified diseases of blood and blood-forming organs: Secondary | ICD-10-CM | POA: Diagnosis not present

## 2014-12-21 DIAGNOSIS — D649 Anemia, unspecified: Secondary | ICD-10-CM | POA: Diagnosis not present

## 2014-12-21 DIAGNOSIS — E1169 Type 2 diabetes mellitus with other specified complication: Secondary | ICD-10-CM | POA: Diagnosis not present

## 2015-01-21 DIAGNOSIS — R972 Elevated prostate specific antigen [PSA]: Secondary | ICD-10-CM | POA: Diagnosis not present

## 2015-01-21 DIAGNOSIS — L97419 Non-pressure chronic ulcer of right heel and midfoot with unspecified severity: Secondary | ICD-10-CM | POA: Diagnosis not present

## 2015-01-21 DIAGNOSIS — L723 Sebaceous cyst: Secondary | ICD-10-CM | POA: Diagnosis not present

## 2015-01-21 DIAGNOSIS — E1169 Type 2 diabetes mellitus with other specified complication: Secondary | ICD-10-CM | POA: Diagnosis not present

## 2015-02-04 DIAGNOSIS — R319 Hematuria, unspecified: Secondary | ICD-10-CM | POA: Diagnosis not present

## 2015-02-04 DIAGNOSIS — N471 Phimosis: Secondary | ICD-10-CM | POA: Diagnosis not present

## 2015-02-04 DIAGNOSIS — Z9849 Cataract extraction status, unspecified eye: Secondary | ICD-10-CM | POA: Diagnosis not present

## 2015-02-04 DIAGNOSIS — E13321 Other specified diabetes mellitus with mild nonproliferative diabetic retinopathy with macular edema: Secondary | ICD-10-CM | POA: Diagnosis not present

## 2015-02-04 DIAGNOSIS — R300 Dysuria: Secondary | ICD-10-CM | POA: Diagnosis not present

## 2015-02-04 DIAGNOSIS — H1045 Other chronic allergic conjunctivitis: Secondary | ICD-10-CM | POA: Diagnosis not present

## 2015-02-04 DIAGNOSIS — Z961 Presence of intraocular lens: Secondary | ICD-10-CM | POA: Diagnosis not present

## 2015-02-04 DIAGNOSIS — R972 Elevated prostate specific antigen [PSA]: Secondary | ICD-10-CM | POA: Diagnosis not present

## 2015-02-04 DIAGNOSIS — Z794 Long term (current) use of insulin: Secondary | ICD-10-CM | POA: Diagnosis not present

## 2015-02-04 DIAGNOSIS — H4011X2 Primary open-angle glaucoma, moderate stage: Secondary | ICD-10-CM | POA: Diagnosis not present

## 2015-02-04 DIAGNOSIS — H16223 Keratoconjunctivitis sicca, not specified as Sjogren's, bilateral: Secondary | ICD-10-CM | POA: Diagnosis not present

## 2015-02-28 DIAGNOSIS — E1169 Type 2 diabetes mellitus with other specified complication: Secondary | ICD-10-CM | POA: Diagnosis not present

## 2015-02-28 DIAGNOSIS — L97419 Non-pressure chronic ulcer of right heel and midfoot with unspecified severity: Secondary | ICD-10-CM | POA: Diagnosis not present

## 2016-03-01 DIAGNOSIS — G4700 Insomnia, unspecified: Secondary | ICD-10-CM | POA: Diagnosis not present

## 2016-03-01 DIAGNOSIS — H1011 Acute atopic conjunctivitis, right eye: Secondary | ICD-10-CM | POA: Diagnosis not present

## 2016-03-01 DIAGNOSIS — E11649 Type 2 diabetes mellitus with hypoglycemia without coma: Secondary | ICD-10-CM | POA: Diagnosis not present

## 2016-03-01 DIAGNOSIS — J301 Allergic rhinitis due to pollen: Secondary | ICD-10-CM | POA: Diagnosis not present

## 2016-03-01 DIAGNOSIS — E782 Mixed hyperlipidemia: Secondary | ICD-10-CM | POA: Diagnosis not present

## 2017-12-22 DIAGNOSIS — R64 Cachexia: Secondary | ICD-10-CM | POA: Diagnosis not present

## 2017-12-22 DIAGNOSIS — N3000 Acute cystitis without hematuria: Secondary | ICD-10-CM | POA: Diagnosis not present

## 2017-12-22 DIAGNOSIS — R51 Headache: Secondary | ICD-10-CM | POA: Diagnosis not present

## 2017-12-22 DIAGNOSIS — R41841 Cognitive communication deficit: Secondary | ICD-10-CM | POA: Diagnosis not present

## 2017-12-22 DIAGNOSIS — E119 Type 2 diabetes mellitus without complications: Secondary | ICD-10-CM | POA: Diagnosis not present

## 2017-12-22 DIAGNOSIS — R531 Weakness: Secondary | ICD-10-CM | POA: Diagnosis not present

## 2017-12-22 DIAGNOSIS — B879 Myiasis, unspecified: Secondary | ICD-10-CM | POA: Diagnosis not present

## 2017-12-22 DIAGNOSIS — N179 Acute kidney failure, unspecified: Secondary | ICD-10-CM | POA: Diagnosis not present

## 2017-12-22 DIAGNOSIS — A419 Sepsis, unspecified organism: Secondary | ICD-10-CM | POA: Diagnosis not present

## 2017-12-22 DIAGNOSIS — R627 Adult failure to thrive: Secondary | ICD-10-CM | POA: Diagnosis not present

## 2017-12-22 DIAGNOSIS — R279 Unspecified lack of coordination: Secondary | ICD-10-CM | POA: Diagnosis not present

## 2017-12-22 DIAGNOSIS — R6251 Failure to thrive (child): Secondary | ICD-10-CM | POA: Diagnosis not present

## 2017-12-22 DIAGNOSIS — I10 Essential (primary) hypertension: Secondary | ICD-10-CM | POA: Diagnosis not present

## 2017-12-22 DIAGNOSIS — Z681 Body mass index (BMI) 19 or less, adult: Secondary | ICD-10-CM | POA: Diagnosis not present

## 2017-12-22 DIAGNOSIS — Z7401 Bed confinement status: Secondary | ICD-10-CM | POA: Diagnosis not present

## 2017-12-22 DIAGNOSIS — L03115 Cellulitis of right lower limb: Secondary | ICD-10-CM | POA: Diagnosis not present

## 2017-12-22 DIAGNOSIS — N390 Urinary tract infection, site not specified: Secondary | ICD-10-CM | POA: Diagnosis not present

## 2017-12-22 DIAGNOSIS — E0821 Diabetes mellitus due to underlying condition with diabetic nephropathy: Secondary | ICD-10-CM | POA: Diagnosis not present

## 2017-12-22 DIAGNOSIS — E43 Unspecified severe protein-calorie malnutrition: Secondary | ICD-10-CM | POA: Diagnosis not present

## 2017-12-22 DIAGNOSIS — R2689 Other abnormalities of gait and mobility: Secondary | ICD-10-CM | POA: Diagnosis not present

## 2017-12-22 DIAGNOSIS — G9341 Metabolic encephalopathy: Secondary | ICD-10-CM | POA: Diagnosis not present

## 2017-12-22 DIAGNOSIS — M6281 Muscle weakness (generalized): Secondary | ICD-10-CM | POA: Diagnosis not present

## 2017-12-22 DIAGNOSIS — R600 Localized edema: Secondary | ICD-10-CM | POA: Diagnosis not present

## 2017-12-22 DIAGNOSIS — L03116 Cellulitis of left lower limb: Secondary | ICD-10-CM | POA: Diagnosis not present

## 2017-12-23 DIAGNOSIS — L03116 Cellulitis of left lower limb: Secondary | ICD-10-CM

## 2017-12-25 DIAGNOSIS — E0821 Diabetes mellitus due to underlying condition with diabetic nephropathy: Secondary | ICD-10-CM | POA: Diagnosis not present

## 2017-12-25 DIAGNOSIS — R632 Polyphagia: Secondary | ICD-10-CM | POA: Diagnosis not present

## 2017-12-25 DIAGNOSIS — R609 Edema, unspecified: Secondary | ICD-10-CM | POA: Diagnosis not present

## 2017-12-25 DIAGNOSIS — I2510 Atherosclerotic heart disease of native coronary artery without angina pectoris: Secondary | ICD-10-CM | POA: Diagnosis not present

## 2017-12-25 DIAGNOSIS — D473 Essential (hemorrhagic) thrombocythemia: Secondary | ICD-10-CM | POA: Diagnosis not present

## 2017-12-25 DIAGNOSIS — E119 Type 2 diabetes mellitus without complications: Secondary | ICD-10-CM | POA: Diagnosis not present

## 2017-12-25 DIAGNOSIS — B879 Myiasis, unspecified: Secondary | ICD-10-CM | POA: Diagnosis not present

## 2017-12-25 DIAGNOSIS — N390 Urinary tract infection, site not specified: Secondary | ICD-10-CM | POA: Diagnosis not present

## 2017-12-25 DIAGNOSIS — Z7401 Bed confinement status: Secondary | ICD-10-CM | POA: Diagnosis not present

## 2017-12-25 DIAGNOSIS — G4700 Insomnia, unspecified: Secondary | ICD-10-CM | POA: Diagnosis not present

## 2017-12-25 DIAGNOSIS — D649 Anemia, unspecified: Secondary | ICD-10-CM | POA: Diagnosis not present

## 2017-12-25 DIAGNOSIS — I10 Essential (primary) hypertension: Secondary | ICD-10-CM | POA: Diagnosis not present

## 2017-12-25 DIAGNOSIS — L03119 Cellulitis of unspecified part of limb: Secondary | ICD-10-CM | POA: Diagnosis not present

## 2017-12-25 DIAGNOSIS — R41841 Cognitive communication deficit: Secondary | ICD-10-CM | POA: Diagnosis not present

## 2017-12-25 DIAGNOSIS — M2042 Other hammer toe(s) (acquired), left foot: Secondary | ICD-10-CM | POA: Diagnosis not present

## 2017-12-25 DIAGNOSIS — D509 Iron deficiency anemia, unspecified: Secondary | ICD-10-CM | POA: Diagnosis not present

## 2017-12-25 DIAGNOSIS — R4189 Other symptoms and signs involving cognitive functions and awareness: Secondary | ICD-10-CM | POA: Diagnosis not present

## 2017-12-25 DIAGNOSIS — E1151 Type 2 diabetes mellitus with diabetic peripheral angiopathy without gangrene: Secondary | ICD-10-CM | POA: Diagnosis not present

## 2017-12-25 DIAGNOSIS — L97909 Non-pressure chronic ulcer of unspecified part of unspecified lower leg with unspecified severity: Secondary | ICD-10-CM | POA: Diagnosis not present

## 2017-12-25 DIAGNOSIS — R627 Adult failure to thrive: Secondary | ICD-10-CM | POA: Diagnosis not present

## 2017-12-25 DIAGNOSIS — L03115 Cellulitis of right lower limb: Secondary | ICD-10-CM | POA: Diagnosis not present

## 2017-12-25 DIAGNOSIS — L03116 Cellulitis of left lower limb: Secondary | ICD-10-CM | POA: Diagnosis not present

## 2017-12-25 DIAGNOSIS — I872 Venous insufficiency (chronic) (peripheral): Secondary | ICD-10-CM | POA: Diagnosis not present

## 2017-12-25 DIAGNOSIS — R2689 Other abnormalities of gait and mobility: Secondary | ICD-10-CM | POA: Diagnosis not present

## 2017-12-25 DIAGNOSIS — R279 Unspecified lack of coordination: Secondary | ICD-10-CM | POA: Diagnosis not present

## 2017-12-25 DIAGNOSIS — M2041 Other hammer toe(s) (acquired), right foot: Secondary | ICD-10-CM | POA: Diagnosis not present

## 2017-12-25 DIAGNOSIS — R64 Cachexia: Secondary | ICD-10-CM | POA: Diagnosis not present

## 2017-12-25 DIAGNOSIS — F419 Anxiety disorder, unspecified: Secondary | ICD-10-CM | POA: Diagnosis not present

## 2017-12-25 DIAGNOSIS — N179 Acute kidney failure, unspecified: Secondary | ICD-10-CM | POA: Diagnosis not present

## 2017-12-25 DIAGNOSIS — M6281 Muscle weakness (generalized): Secondary | ICD-10-CM | POA: Diagnosis not present

## 2017-12-25 DIAGNOSIS — I83009 Varicose veins of unspecified lower extremity with ulcer of unspecified site: Secondary | ICD-10-CM | POA: Diagnosis not present

## 2017-12-26 DIAGNOSIS — N179 Acute kidney failure, unspecified: Secondary | ICD-10-CM | POA: Diagnosis not present

## 2017-12-26 DIAGNOSIS — D473 Essential (hemorrhagic) thrombocythemia: Secondary | ICD-10-CM | POA: Diagnosis not present

## 2017-12-26 DIAGNOSIS — L03119 Cellulitis of unspecified part of limb: Secondary | ICD-10-CM | POA: Diagnosis not present

## 2017-12-26 DIAGNOSIS — R64 Cachexia: Secondary | ICD-10-CM | POA: Diagnosis not present

## 2018-01-01 DIAGNOSIS — I2510 Atherosclerotic heart disease of native coronary artery without angina pectoris: Secondary | ICD-10-CM | POA: Diagnosis not present

## 2018-01-01 DIAGNOSIS — G4700 Insomnia, unspecified: Secondary | ICD-10-CM | POA: Diagnosis not present

## 2018-01-01 DIAGNOSIS — E119 Type 2 diabetes mellitus without complications: Secondary | ICD-10-CM | POA: Diagnosis not present

## 2018-01-01 DIAGNOSIS — N179 Acute kidney failure, unspecified: Secondary | ICD-10-CM | POA: Diagnosis not present

## 2018-01-03 DIAGNOSIS — E119 Type 2 diabetes mellitus without complications: Secondary | ICD-10-CM | POA: Diagnosis not present

## 2018-01-03 DIAGNOSIS — L03119 Cellulitis of unspecified part of limb: Secondary | ICD-10-CM | POA: Diagnosis not present

## 2018-01-03 DIAGNOSIS — R4189 Other symptoms and signs involving cognitive functions and awareness: Secondary | ICD-10-CM | POA: Diagnosis not present

## 2018-01-03 DIAGNOSIS — R632 Polyphagia: Secondary | ICD-10-CM | POA: Diagnosis not present

## 2018-01-07 DIAGNOSIS — G4700 Insomnia, unspecified: Secondary | ICD-10-CM | POA: Diagnosis not present

## 2018-01-07 DIAGNOSIS — E119 Type 2 diabetes mellitus without complications: Secondary | ICD-10-CM | POA: Diagnosis not present

## 2018-01-07 DIAGNOSIS — R4189 Other symptoms and signs involving cognitive functions and awareness: Secondary | ICD-10-CM | POA: Diagnosis not present

## 2018-01-07 DIAGNOSIS — L03119 Cellulitis of unspecified part of limb: Secondary | ICD-10-CM | POA: Diagnosis not present

## 2018-01-14 DIAGNOSIS — D649 Anemia, unspecified: Secondary | ICD-10-CM | POA: Diagnosis not present

## 2018-01-14 DIAGNOSIS — R4189 Other symptoms and signs involving cognitive functions and awareness: Secondary | ICD-10-CM | POA: Diagnosis not present

## 2018-01-14 DIAGNOSIS — E119 Type 2 diabetes mellitus without complications: Secondary | ICD-10-CM | POA: Diagnosis not present

## 2018-01-14 DIAGNOSIS — L03119 Cellulitis of unspecified part of limb: Secondary | ICD-10-CM | POA: Diagnosis not present

## 2018-01-15 DIAGNOSIS — F419 Anxiety disorder, unspecified: Secondary | ICD-10-CM | POA: Diagnosis not present

## 2018-01-15 DIAGNOSIS — G4700 Insomnia, unspecified: Secondary | ICD-10-CM | POA: Diagnosis not present

## 2018-01-16 ENCOUNTER — Ambulatory Visit (INDEPENDENT_AMBULATORY_CARE_PROVIDER_SITE_OTHER): Payer: Medicare HMO | Admitting: Podiatry

## 2018-01-16 DIAGNOSIS — E1151 Type 2 diabetes mellitus with diabetic peripheral angiopathy without gangrene: Secondary | ICD-10-CM | POA: Diagnosis not present

## 2018-01-16 DIAGNOSIS — I872 Venous insufficiency (chronic) (peripheral): Secondary | ICD-10-CM | POA: Diagnosis not present

## 2018-01-16 DIAGNOSIS — S98112A Complete traumatic amputation of left great toe, initial encounter: Secondary | ICD-10-CM

## 2018-01-16 DIAGNOSIS — L97909 Non-pressure chronic ulcer of unspecified part of unspecified lower leg with unspecified severity: Secondary | ICD-10-CM

## 2018-01-16 DIAGNOSIS — D509 Iron deficiency anemia, unspecified: Secondary | ICD-10-CM | POA: Diagnosis not present

## 2018-01-16 DIAGNOSIS — R64 Cachexia: Secondary | ICD-10-CM | POA: Diagnosis not present

## 2018-01-16 DIAGNOSIS — I83009 Varicose veins of unspecified lower extremity with ulcer of unspecified site: Secondary | ICD-10-CM | POA: Diagnosis not present

## 2018-01-16 DIAGNOSIS — R609 Edema, unspecified: Secondary | ICD-10-CM | POA: Diagnosis not present

## 2018-01-16 DIAGNOSIS — M2041 Other hammer toe(s) (acquired), right foot: Secondary | ICD-10-CM | POA: Diagnosis not present

## 2018-01-16 DIAGNOSIS — M2042 Other hammer toe(s) (acquired), left foot: Secondary | ICD-10-CM | POA: Diagnosis not present

## 2018-01-20 DIAGNOSIS — F0280 Dementia in other diseases classified elsewhere without behavioral disturbance: Secondary | ICD-10-CM | POA: Diagnosis not present

## 2018-01-21 DIAGNOSIS — D508 Other iron deficiency anemias: Secondary | ICD-10-CM | POA: Diagnosis not present

## 2018-01-22 DIAGNOSIS — D649 Anemia, unspecified: Secondary | ICD-10-CM | POA: Diagnosis not present

## 2018-01-22 DIAGNOSIS — R609 Edema, unspecified: Secondary | ICD-10-CM | POA: Diagnosis not present

## 2018-01-22 DIAGNOSIS — R627 Adult failure to thrive: Secondary | ICD-10-CM | POA: Diagnosis not present

## 2018-01-22 DIAGNOSIS — E119 Type 2 diabetes mellitus without complications: Secondary | ICD-10-CM | POA: Diagnosis not present

## 2018-01-23 DIAGNOSIS — R64 Cachexia: Secondary | ICD-10-CM | POA: Diagnosis not present

## 2018-01-23 DIAGNOSIS — R609 Edema, unspecified: Secondary | ICD-10-CM | POA: Diagnosis not present

## 2018-01-23 DIAGNOSIS — F419 Anxiety disorder, unspecified: Secondary | ICD-10-CM | POA: Diagnosis not present

## 2018-01-23 DIAGNOSIS — F0280 Dementia in other diseases classified elsewhere without behavioral disturbance: Secondary | ICD-10-CM | POA: Diagnosis not present

## 2018-01-23 DIAGNOSIS — G4700 Insomnia, unspecified: Secondary | ICD-10-CM | POA: Diagnosis not present

## 2018-01-23 DIAGNOSIS — G301 Alzheimer's disease with late onset: Secondary | ICD-10-CM | POA: Diagnosis not present

## 2018-01-28 DIAGNOSIS — F0280 Dementia in other diseases classified elsewhere without behavioral disturbance: Secondary | ICD-10-CM | POA: Diagnosis not present

## 2018-01-30 DIAGNOSIS — D509 Iron deficiency anemia, unspecified: Secondary | ICD-10-CM | POA: Diagnosis not present

## 2018-01-30 DIAGNOSIS — G4700 Insomnia, unspecified: Secondary | ICD-10-CM | POA: Diagnosis not present

## 2018-01-30 DIAGNOSIS — E119 Type 2 diabetes mellitus without complications: Secondary | ICD-10-CM | POA: Diagnosis not present

## 2018-01-30 DIAGNOSIS — F329 Major depressive disorder, single episode, unspecified: Secondary | ICD-10-CM | POA: Diagnosis not present

## 2018-01-30 DIAGNOSIS — D72829 Elevated white blood cell count, unspecified: Secondary | ICD-10-CM | POA: Diagnosis not present

## 2018-01-30 DIAGNOSIS — G301 Alzheimer's disease with late onset: Secondary | ICD-10-CM | POA: Diagnosis not present

## 2018-01-30 DIAGNOSIS — L03119 Cellulitis of unspecified part of limb: Secondary | ICD-10-CM | POA: Diagnosis not present

## 2018-01-30 DIAGNOSIS — F419 Anxiety disorder, unspecified: Secondary | ICD-10-CM | POA: Diagnosis not present

## 2018-01-31 DIAGNOSIS — Z79899 Other long term (current) drug therapy: Secondary | ICD-10-CM | POA: Diagnosis not present

## 2018-02-03 DIAGNOSIS — Z79899 Other long term (current) drug therapy: Secondary | ICD-10-CM | POA: Diagnosis not present

## 2018-02-04 DIAGNOSIS — E113292 Type 2 diabetes mellitus with mild nonproliferative diabetic retinopathy without macular edema, left eye: Secondary | ICD-10-CM | POA: Diagnosis not present

## 2018-02-04 DIAGNOSIS — H409 Unspecified glaucoma: Secondary | ICD-10-CM | POA: Diagnosis not present

## 2018-02-04 DIAGNOSIS — H401134 Primary open-angle glaucoma, bilateral, indeterminate stage: Secondary | ICD-10-CM | POA: Diagnosis not present

## 2018-02-04 DIAGNOSIS — H26492 Other secondary cataract, left eye: Secondary | ICD-10-CM | POA: Diagnosis not present

## 2018-02-04 DIAGNOSIS — H3581 Retinal edema: Secondary | ICD-10-CM | POA: Diagnosis not present

## 2018-02-04 DIAGNOSIS — R51 Headache: Secondary | ICD-10-CM | POA: Diagnosis not present

## 2018-02-04 DIAGNOSIS — D649 Anemia, unspecified: Secondary | ICD-10-CM | POA: Diagnosis not present

## 2018-02-06 DIAGNOSIS — G301 Alzheimer's disease with late onset: Secondary | ICD-10-CM | POA: Diagnosis not present

## 2018-02-06 DIAGNOSIS — G4700 Insomnia, unspecified: Secondary | ICD-10-CM | POA: Diagnosis not present

## 2018-02-06 DIAGNOSIS — F329 Major depressive disorder, single episode, unspecified: Secondary | ICD-10-CM | POA: Diagnosis not present

## 2018-02-06 DIAGNOSIS — F0280 Dementia in other diseases classified elsewhere without behavioral disturbance: Secondary | ICD-10-CM | POA: Diagnosis not present

## 2018-02-07 DIAGNOSIS — R4189 Other symptoms and signs involving cognitive functions and awareness: Secondary | ICD-10-CM | POA: Diagnosis not present

## 2018-02-07 DIAGNOSIS — N3000 Acute cystitis without hematuria: Secondary | ICD-10-CM | POA: Diagnosis not present

## 2018-02-13 ENCOUNTER — Ambulatory Visit (INDEPENDENT_AMBULATORY_CARE_PROVIDER_SITE_OTHER): Payer: Medicare HMO | Admitting: Podiatry

## 2018-02-13 ENCOUNTER — Encounter: Payer: Self-pay | Admitting: Podiatry

## 2018-02-13 DIAGNOSIS — L97909 Non-pressure chronic ulcer of unspecified part of unspecified lower leg with unspecified severity: Principal | ICD-10-CM

## 2018-02-13 DIAGNOSIS — L97518 Non-pressure chronic ulcer of other part of right foot with other specified severity: Secondary | ICD-10-CM

## 2018-02-13 DIAGNOSIS — M2042 Other hammer toe(s) (acquired), left foot: Secondary | ICD-10-CM

## 2018-02-13 DIAGNOSIS — E08621 Diabetes mellitus due to underlying condition with foot ulcer: Secondary | ICD-10-CM

## 2018-02-13 DIAGNOSIS — S98112A Complete traumatic amputation of left great toe, initial encounter: Secondary | ICD-10-CM | POA: Diagnosis not present

## 2018-02-13 DIAGNOSIS — I872 Venous insufficiency (chronic) (peripheral): Secondary | ICD-10-CM | POA: Diagnosis not present

## 2018-02-13 DIAGNOSIS — L97509 Non-pressure chronic ulcer of other part of unspecified foot with unspecified severity: Secondary | ICD-10-CM | POA: Diagnosis not present

## 2018-02-13 DIAGNOSIS — L97421 Non-pressure chronic ulcer of left heel and midfoot limited to breakdown of skin: Secondary | ICD-10-CM

## 2018-02-13 DIAGNOSIS — I83009 Varicose veins of unspecified lower extremity with ulcer of unspecified site: Secondary | ICD-10-CM | POA: Diagnosis not present

## 2018-02-13 DIAGNOSIS — M2041 Other hammer toe(s) (acquired), right foot: Secondary | ICD-10-CM | POA: Diagnosis not present

## 2018-02-13 DIAGNOSIS — E1151 Type 2 diabetes mellitus with diabetic peripheral angiopathy without gangrene: Secondary | ICD-10-CM | POA: Diagnosis not present

## 2018-02-13 NOTE — Patient Instructions (Signed)
Continue Unna boots bilat.

## 2018-02-14 DIAGNOSIS — G309 Alzheimer's disease, unspecified: Secondary | ICD-10-CM | POA: Diagnosis not present

## 2018-02-14 NOTE — Progress Notes (Signed)
  Subjective:  Patient ID: [name], male    DOB: 12-31-1945,  MRN: [id number]  No chief complaint on file.   72 y.o. male presents for wound care.  Follow-up from the hospital.  Reports wound to both legs.  Currently at facility.  Denies any complaints.  Review of Systems: Negative except as noted in the HPI. Denies N/V/F/Ch.  Past Medical History:  Diagnosis Date  . Coronary artery disease   . Diabetes mellitus    type II  . Gastritis   . Glaucoma    hx of  . Hypercholesterolemia   . Hypertension   . Insomnia   . Stroke ([place])    hx of multiple    Current Outpatient Medications:  .  aspirin 325 MG EC tablet, Take 325 mg by mouth daily.  , Disp: , Rfl:  .  atenolol (TENORMIN) 50 MG tablet, Take 50 mg by mouth. Take 1 1/2 tablet daily , Disp: , Rfl:  .  enalapril (VASOTEC) 2.5 MG tablet, Take 2.5 mg by mouth daily.  , Disp: , Rfl:  .  insulin glargine (LANTUS) 100 UNIT/ML injection, Inject 10 Units into the skin at bedtime.  , Disp: , Rfl:  .  insulin lispro (HUMALOG) 100 UNIT/ML injection, Inject 3 Units into the skin 3 (three) times daily before meals.  , Disp: , Rfl:  .  nitroGLYCERIN (NITROSTAT) 0.4 MG SL tablet, Place 0.4 mg under the tongue every 5 (five) minutes as needed.  , Disp: , Rfl:  .  rosuvastatin (CRESTOR) 10 MG tablet, Take 10 mg by mouth daily.  , Disp: , Rfl:   Social History   Tobacco Use  Smoking Status Not on file    Not on File Objective:  There were no vitals filed for this visit. There is no height or weight on file to calculate BMI. Constitutional Well developed. Well nourished.  Vascular Dorsalis pedis pulses palpable bilaterally. Posterior tibial pulses non-palpable bilaterally. Capillary refill normal to all digits.  No cyanosis or clubbing noted. Pedal hair growth normal. Venous insufficiency bilateral lower extremity's  Neurologic Normal speech. Oriented to person, place, and time. Protective sensation absent  Dermatologic Small  venous stasis ulcerations present bilateral lower extremity  Orthopedic: No pain to palpation either foot. History of amputation left great toe   Radiographs: Today Assessment:   1. Venous ulcer ([place])   2. Venous (peripheral) insufficiency   3. Diabetes mellitus type 2 with peripheral artery disease ([place])   4. Amputated great toe of left foot ([place])   5. Hammertoes of both feet    Plan:  Patient was evaluated and treated and all questions answered.  Venous ulcer bilateral lower extremity -Multilayer compression dressing consisting of Unna boot, cast padding, Coban applied. -Orders written for facility to continue Unna boots. -Discussed importance of compression for healing of wounds.  Diabetes mellitus with history of left great toe amputation, hammertoes -Would benefit from toe filler left foot and shoes however will hold off at this time as both lower extremity's are edematous.  Will refer patient for toe filler left foot once edema controlled  Return in about 1 month (around 02/13/2018) for Wound Care, Bilateral.

## 2018-02-14 NOTE — Progress Notes (Signed)
Subjective:  Patient ID: [name], male    DOB: Dec 11, 1945,  MRN: [id number]  Chief Complaint  Patient presents with  . Wound Check    bilateral, no open lesions or wounds, hard preulcerative callus on Rt plantar forefoot    72 y.o. male presents for wound care.  Follow-up from the hospital.  Reports wound to both legs.  Currently at facility.  Denies any complaints.  Review of Systems: Negative except as noted in the HPI. Denies N/V/F/Ch.  Past Medical History:  Diagnosis Date  . Coronary artery disease   . Diabetes mellitus    type II  . Gastritis   . Glaucoma    hx of  . Hypercholesterolemia   . Hypertension   . Insomnia   . Stroke ([place])    hx of multiple    Current Outpatient Medications:  .  aspirin 325 MG EC tablet, Take 325 mg by mouth daily.  , Disp: , Rfl:  .  atenolol (TENORMIN) 50 MG tablet, Take 50 mg by mouth. Take 1 1/2 tablet daily , Disp: , Rfl:  .  enalapril (VASOTEC) 2.5 MG tablet, Take 2.5 mg by mouth daily.  , Disp: , Rfl:  .  insulin glargine (LANTUS) 100 UNIT/ML injection, Inject 10 Units into the skin at bedtime.  , Disp: , Rfl:  .  insulin lispro (HUMALOG) 100 UNIT/ML injection, Inject 3 Units into the skin 3 (three) times daily before meals.  , Disp: , Rfl:  .  nitroGLYCERIN (NITROSTAT) 0.4 MG SL tablet, Place 0.4 mg under the tongue every 5 (five) minutes as needed.  , Disp: , Rfl:  .  rosuvastatin (CRESTOR) 10 MG tablet, Take 10 mg by mouth daily.  , Disp: , Rfl:   Social History   Tobacco Use  Smoking Status Not on file    Not on File Objective:  There were no vitals filed for this visit. There is no height or weight on file to calculate BMI. Constitutional Well developed. Well nourished.  Vascular Dorsalis pedis pulses palpable bilaterally. Posterior tibial pulses non-palpable bilaterally. Capillary refill normal to all digits.  No cyanosis or clubbing noted. Pedal hair growth absent.  Neurologic Normal speech. Oriented to  person, place, and time. Protective sensation absent  Dermatologic Small venous stasis ulcerations present bilateral lower extremity Ulceration left sub-met 3 measuring one by one post debridement   Orthopedic: No pain to palpation either foot. History of amputation left great toe   Radiographs: None today Assessment:   1. Venous ulcer ([place])   2. Venous (peripheral) insufficiency   3. Diabetic ulcer of left midfoot associated with diabetes mellitus due to underlying condition, limited to breakdown of skin ([place])   4. Diabetes mellitus type 2 with peripheral artery disease ([place])   5. Amputated great toe of left foot ([place])   6. Hammertoes of both feet    Plan:  Patient was evaluated and treated and all questions answered.  Venous ulcer bilateral lower extremity -Edema improved, ulcers appear improved -Multilayer compression dressing consisting of Unna boot cast padding Coban applied today  Diabetes mellitus with history of left great toe amputation, hammertoes -We will refer for diabetic shoes as edema is better controlled at this time. -Diabetic she is medically necessary for offloading deformities.  Benefit from toe filler.  Ulceration left third metatarsal -Debrided as below. -Dressed with Unna boot -Dispense surgical shoes bilateral, forefoot offloading shoe left  Procedure: Excisional Debridement of Wound Rationale: Removal of non-viable soft tissue from  the wound to promote healing.  Anesthesia: none Pre-Debridement Wound Measurements: overlying hyperkeratosis  Post-Debridement Wound Measurements: 1 cm x 1 cm x 0.1 cm  Type of Debridement: Sharp Excisional Tissue Removed: Non-viable soft tissue Depth of Debridement: subcutaneous tissue. Technique: Sharp excisional debridement to bleeding, viable wound base.  Dressing: Dry, sterile, compression dressing. Disposition: Patient tolerated procedure well. Patient to return in 2 weeks for follow-up.  Return in about 2 weeks  (around 02/27/2018) for Wound Care, Left.

## 2018-02-15 DIAGNOSIS — G309 Alzheimer's disease, unspecified: Secondary | ICD-10-CM | POA: Diagnosis not present

## 2018-02-16 DIAGNOSIS — G309 Alzheimer's disease, unspecified: Secondary | ICD-10-CM | POA: Diagnosis not present

## 2018-02-17 DIAGNOSIS — G309 Alzheimer's disease, unspecified: Secondary | ICD-10-CM | POA: Diagnosis not present

## 2018-02-18 DIAGNOSIS — G4700 Insomnia, unspecified: Secondary | ICD-10-CM | POA: Diagnosis not present

## 2018-02-18 DIAGNOSIS — F0280 Dementia in other diseases classified elsewhere without behavioral disturbance: Secondary | ICD-10-CM | POA: Diagnosis not present

## 2018-02-18 DIAGNOSIS — F419 Anxiety disorder, unspecified: Secondary | ICD-10-CM | POA: Diagnosis not present

## 2018-02-18 DIAGNOSIS — G309 Alzheimer's disease, unspecified: Secondary | ICD-10-CM | POA: Diagnosis not present

## 2018-02-18 DIAGNOSIS — G301 Alzheimer's disease with late onset: Secondary | ICD-10-CM | POA: Diagnosis not present

## 2018-02-19 ENCOUNTER — Ambulatory Visit: Payer: Medicare HMO | Admitting: Orthotics

## 2018-02-19 DIAGNOSIS — I83009 Varicose veins of unspecified lower extremity with ulcer of unspecified site: Secondary | ICD-10-CM

## 2018-02-19 DIAGNOSIS — M2041 Other hammer toe(s) (acquired), right foot: Secondary | ICD-10-CM

## 2018-02-19 DIAGNOSIS — G309 Alzheimer's disease, unspecified: Secondary | ICD-10-CM | POA: Diagnosis not present

## 2018-02-19 DIAGNOSIS — E1151 Type 2 diabetes mellitus with diabetic peripheral angiopathy without gangrene: Secondary | ICD-10-CM

## 2018-02-19 DIAGNOSIS — M2042 Other hammer toe(s) (acquired), left foot: Secondary | ICD-10-CM

## 2018-02-19 DIAGNOSIS — E08621 Diabetes mellitus due to underlying condition with foot ulcer: Secondary | ICD-10-CM

## 2018-02-19 DIAGNOSIS — L97909 Non-pressure chronic ulcer of unspecified part of unspecified lower leg with unspecified severity: Principal | ICD-10-CM

## 2018-02-19 DIAGNOSIS — L97421 Non-pressure chronic ulcer of left heel and midfoot limited to breakdown of skin: Secondary | ICD-10-CM

## 2018-02-20 DIAGNOSIS — E1169 Type 2 diabetes mellitus with other specified complication: Secondary | ICD-10-CM | POA: Diagnosis not present

## 2018-02-20 DIAGNOSIS — F0280 Dementia in other diseases classified elsewhere without behavioral disturbance: Secondary | ICD-10-CM | POA: Diagnosis not present

## 2018-02-20 DIAGNOSIS — L03116 Cellulitis of left lower limb: Secondary | ICD-10-CM | POA: Diagnosis not present

## 2018-02-20 DIAGNOSIS — G309 Alzheimer's disease, unspecified: Secondary | ICD-10-CM | POA: Diagnosis not present

## 2018-02-20 DIAGNOSIS — M1611 Unilateral primary osteoarthritis, right hip: Secondary | ICD-10-CM | POA: Diagnosis not present

## 2018-02-20 DIAGNOSIS — G301 Alzheimer's disease with late onset: Secondary | ICD-10-CM | POA: Diagnosis not present

## 2018-02-20 DIAGNOSIS — L03115 Cellulitis of right lower limb: Secondary | ICD-10-CM | POA: Diagnosis not present

## 2018-02-21 DIAGNOSIS — E1169 Type 2 diabetes mellitus with other specified complication: Secondary | ICD-10-CM | POA: Diagnosis not present

## 2018-02-21 DIAGNOSIS — F0280 Dementia in other diseases classified elsewhere without behavioral disturbance: Secondary | ICD-10-CM | POA: Diagnosis not present

## 2018-02-21 DIAGNOSIS — G301 Alzheimer's disease with late onset: Secondary | ICD-10-CM | POA: Diagnosis not present

## 2018-02-21 DIAGNOSIS — M1611 Unilateral primary osteoarthritis, right hip: Secondary | ICD-10-CM | POA: Diagnosis not present

## 2018-02-21 DIAGNOSIS — G309 Alzheimer's disease, unspecified: Secondary | ICD-10-CM | POA: Diagnosis not present

## 2018-02-21 DIAGNOSIS — M25551 Pain in right hip: Secondary | ICD-10-CM | POA: Diagnosis not present

## 2018-02-21 DIAGNOSIS — L03115 Cellulitis of right lower limb: Secondary | ICD-10-CM | POA: Diagnosis not present

## 2018-02-21 DIAGNOSIS — L03116 Cellulitis of left lower limb: Secondary | ICD-10-CM | POA: Diagnosis not present

## 2018-02-21 NOTE — Progress Notes (Signed)

## 2018-02-22 DIAGNOSIS — G309 Alzheimer's disease, unspecified: Secondary | ICD-10-CM | POA: Diagnosis not present

## 2018-02-23 DIAGNOSIS — G309 Alzheimer's disease, unspecified: Secondary | ICD-10-CM | POA: Diagnosis not present

## 2018-02-24 DIAGNOSIS — G309 Alzheimer's disease, unspecified: Secondary | ICD-10-CM | POA: Diagnosis not present

## 2018-02-24 DIAGNOSIS — R2689 Other abnormalities of gait and mobility: Secondary | ICD-10-CM | POA: Diagnosis not present

## 2018-02-24 DIAGNOSIS — G301 Alzheimer's disease with late onset: Secondary | ICD-10-CM | POA: Diagnosis not present

## 2018-02-24 DIAGNOSIS — F0280 Dementia in other diseases classified elsewhere without behavioral disturbance: Secondary | ICD-10-CM | POA: Diagnosis not present

## 2018-02-24 DIAGNOSIS — E1169 Type 2 diabetes mellitus with other specified complication: Secondary | ICD-10-CM | POA: Diagnosis not present

## 2018-02-25 DIAGNOSIS — L03115 Cellulitis of right lower limb: Secondary | ICD-10-CM | POA: Diagnosis not present

## 2018-02-25 DIAGNOSIS — E1169 Type 2 diabetes mellitus with other specified complication: Secondary | ICD-10-CM | POA: Diagnosis not present

## 2018-02-25 DIAGNOSIS — G309 Alzheimer's disease, unspecified: Secondary | ICD-10-CM | POA: Diagnosis not present

## 2018-02-25 DIAGNOSIS — L03116 Cellulitis of left lower limb: Secondary | ICD-10-CM | POA: Diagnosis not present

## 2018-02-25 DIAGNOSIS — F0280 Dementia in other diseases classified elsewhere without behavioral disturbance: Secondary | ICD-10-CM | POA: Diagnosis not present

## 2018-02-25 DIAGNOSIS — M1611 Unilateral primary osteoarthritis, right hip: Secondary | ICD-10-CM | POA: Diagnosis not present

## 2018-02-25 DIAGNOSIS — G301 Alzheimer's disease with late onset: Secondary | ICD-10-CM | POA: Diagnosis not present

## 2018-02-26 DIAGNOSIS — L03115 Cellulitis of right lower limb: Secondary | ICD-10-CM | POA: Diagnosis not present

## 2018-02-26 DIAGNOSIS — F0280 Dementia in other diseases classified elsewhere without behavioral disturbance: Secondary | ICD-10-CM | POA: Diagnosis not present

## 2018-02-26 DIAGNOSIS — E1169 Type 2 diabetes mellitus with other specified complication: Secondary | ICD-10-CM | POA: Diagnosis not present

## 2018-02-26 DIAGNOSIS — L03116 Cellulitis of left lower limb: Secondary | ICD-10-CM | POA: Diagnosis not present

## 2018-02-26 DIAGNOSIS — G309 Alzheimer's disease, unspecified: Secondary | ICD-10-CM | POA: Diagnosis not present

## 2018-02-26 DIAGNOSIS — M1611 Unilateral primary osteoarthritis, right hip: Secondary | ICD-10-CM | POA: Diagnosis not present

## 2018-02-26 DIAGNOSIS — G301 Alzheimer's disease with late onset: Secondary | ICD-10-CM | POA: Diagnosis not present

## 2018-02-27 DIAGNOSIS — G301 Alzheimer's disease with late onset: Secondary | ICD-10-CM | POA: Diagnosis not present

## 2018-02-27 DIAGNOSIS — G309 Alzheimer's disease, unspecified: Secondary | ICD-10-CM | POA: Diagnosis not present

## 2018-02-27 DIAGNOSIS — M1611 Unilateral primary osteoarthritis, right hip: Secondary | ICD-10-CM | POA: Diagnosis not present

## 2018-02-27 DIAGNOSIS — E1169 Type 2 diabetes mellitus with other specified complication: Secondary | ICD-10-CM | POA: Diagnosis not present

## 2018-02-27 DIAGNOSIS — L03115 Cellulitis of right lower limb: Secondary | ICD-10-CM | POA: Diagnosis not present

## 2018-02-27 DIAGNOSIS — L03116 Cellulitis of left lower limb: Secondary | ICD-10-CM | POA: Diagnosis not present

## 2018-02-27 DIAGNOSIS — F0280 Dementia in other diseases classified elsewhere without behavioral disturbance: Secondary | ICD-10-CM | POA: Diagnosis not present

## 2018-02-28 ENCOUNTER — Ambulatory Visit (INDEPENDENT_AMBULATORY_CARE_PROVIDER_SITE_OTHER): Payer: Medicare HMO | Admitting: Podiatry

## 2018-02-28 DIAGNOSIS — E08621 Diabetes mellitus due to underlying condition with foot ulcer: Secondary | ICD-10-CM

## 2018-02-28 DIAGNOSIS — L97421 Non-pressure chronic ulcer of left heel and midfoot limited to breakdown of skin: Secondary | ICD-10-CM | POA: Diagnosis not present

## 2018-02-28 DIAGNOSIS — I83009 Varicose veins of unspecified lower extremity with ulcer of unspecified site: Secondary | ICD-10-CM

## 2018-02-28 DIAGNOSIS — G309 Alzheimer's disease, unspecified: Secondary | ICD-10-CM | POA: Diagnosis not present

## 2018-02-28 DIAGNOSIS — I872 Venous insufficiency (chronic) (peripheral): Secondary | ICD-10-CM | POA: Diagnosis not present

## 2018-02-28 DIAGNOSIS — L97909 Non-pressure chronic ulcer of unspecified part of unspecified lower leg with unspecified severity: Secondary | ICD-10-CM

## 2018-02-28 NOTE — Progress Notes (Signed)
Subjective:  Patient ID: [name], male    DOB: Feb 13, 1946,  MRN: [id number]  Chief Complaint  Patient presents with  . Foot Ulcer    Wound Care, Left.    72 y.o. male presents for wound care.  Currently at facility.  States that they are not comfortable with him having a forefoot surgical shoe because he is a fall risk.  Because of this he is ambulating directly on the wound.  There is to stop dressing it because it callused over and the facility believed it to be healed.  Review of Systems: Negative except as noted in the HPI. Denies N/V/F/Ch.  Past Medical History:  Diagnosis Date  . Coronary artery disease   . Diabetes mellitus    type II  . Gastritis   . Glaucoma    hx of  . Hypercholesterolemia   . Hypertension   . Insomnia   . Stroke ([place])    hx of multiple    Current Outpatient Medications:  .  aspirin 325 MG EC tablet, Take 325 mg by mouth daily.  , Disp: , Rfl:  .  atenolol (TENORMIN) 50 MG tablet, Take 50 mg by mouth. Take 1 1/2 tablet daily , Disp: , Rfl:  .  enalapril (VASOTEC) 2.5 MG tablet, Take 2.5 mg by mouth daily.  , Disp: , Rfl:  .  insulin glargine (LANTUS) 100 UNIT/ML injection, Inject 10 Units into the skin at bedtime.  , Disp: , Rfl:  .  insulin lispro (HUMALOG) 100 UNIT/ML injection, Inject 3 Units into the skin 3 (three) times daily before meals.  , Disp: , Rfl:  .  nitroGLYCERIN (NITROSTAT) 0.4 MG SL tablet, Place 0.4 mg under the tongue every 5 (five) minutes as needed.  , Disp: , Rfl:  .  rosuvastatin (CRESTOR) 10 MG tablet, Take 10 mg by mouth daily.  , Disp: , Rfl:   Social History   Tobacco Use  Smoking Status Not on file    Not on File Objective:  There were no vitals filed for this visit. There is no height or weight on file to calculate BMI. Constitutional Well developed. Well nourished.  Vascular Dorsalis pedis pulses palpable bilaterally. Posterior tibial pulses non-palpable bilaterally. Capillary refill normal to all  digits.  No cyanosis or clubbing noted. Pedal hair growth absent.  Neurologic Normal speech. Oriented to person, place, and time. Protective sensation absent  Dermatologic Small venous stasis ulcerations present bilateral lower extremity Ulceration left sub-met 3 measuring one by one post debridement   Orthopedic: No pain to palpation either foot. History of amputation left great toe   Radiographs: None today Assessment:   1. Diabetic ulcer of left midfoot associated with diabetes mellitus due to underlying condition, limited to breakdown of skin ([place])   2. Venous ulcer ([place])   3. Venous (peripheral) insufficiency    Plan:  Patient was evaluated and treated and all questions answered.  Venous ulcer bilateral lower extremity -Edema improved, ulcers appear improved -No open ulcerations today.  No Unna boot applied  Ulceration left third metatarsal with history of hallux amputation -Debrided as below. -Dressed with medihoney and DSD -Switch to surgical shoe left. -Awaiting toe filler.  Needs to have a primary care doctor so they can be approved.  Discussed the importance of getting primary care provider  Procedure: Excisional Debridement of Wound Rationale: Removal of non-viable soft tissue from the wound to promote healing.  Anesthesia: none Pre-Debridement Wound Measurements: Overlying hyperkeratosis Post-Debridement Wound Measurements: 2  cm x 2 cm x 0.2 cm  Type of Debridement: Sharp Excisional Tissue Removed: Non-viable soft tissue Depth of Debridement: subcutaneous tissue. Technique: Sharp excisional debridement to bleeding, viable wound base.  Dressing: Dry, sterile, compression dressing. Disposition: Patient tolerated procedure well. Patient to return in 1 week for follow-up.    Return in about 2 weeks (around 03/14/2018) for Wound Care, Left.

## 2018-03-01 DIAGNOSIS — G309 Alzheimer's disease, unspecified: Secondary | ICD-10-CM | POA: Diagnosis not present

## 2018-03-02 DIAGNOSIS — G309 Alzheimer's disease, unspecified: Secondary | ICD-10-CM | POA: Diagnosis not present

## 2018-03-03 DIAGNOSIS — B0239 Other herpes zoster eye disease: Secondary | ICD-10-CM | POA: Diagnosis not present

## 2018-03-03 DIAGNOSIS — F0280 Dementia in other diseases classified elsewhere without behavioral disturbance: Secondary | ICD-10-CM | POA: Diagnosis not present

## 2018-03-03 DIAGNOSIS — L22 Diaper dermatitis: Secondary | ICD-10-CM | POA: Diagnosis not present

## 2018-03-03 DIAGNOSIS — E1169 Type 2 diabetes mellitus with other specified complication: Secondary | ICD-10-CM | POA: Diagnosis not present

## 2018-03-03 DIAGNOSIS — G309 Alzheimer's disease, unspecified: Secondary | ICD-10-CM | POA: Diagnosis not present

## 2018-03-03 DIAGNOSIS — I639 Cerebral infarction, unspecified: Secondary | ICD-10-CM | POA: Diagnosis not present

## 2018-03-03 DIAGNOSIS — R2689 Other abnormalities of gait and mobility: Secondary | ICD-10-CM | POA: Diagnosis not present

## 2018-03-03 DIAGNOSIS — E08621 Diabetes mellitus due to underlying condition with foot ulcer: Secondary | ICD-10-CM | POA: Diagnosis not present

## 2018-03-03 DIAGNOSIS — G301 Alzheimer's disease with late onset: Secondary | ICD-10-CM | POA: Diagnosis not present

## 2018-03-04 DIAGNOSIS — G301 Alzheimer's disease with late onset: Secondary | ICD-10-CM | POA: Diagnosis not present

## 2018-03-04 DIAGNOSIS — M1611 Unilateral primary osteoarthritis, right hip: Secondary | ICD-10-CM | POA: Diagnosis not present

## 2018-03-04 DIAGNOSIS — L03115 Cellulitis of right lower limb: Secondary | ICD-10-CM | POA: Diagnosis not present

## 2018-03-04 DIAGNOSIS — L03116 Cellulitis of left lower limb: Secondary | ICD-10-CM | POA: Diagnosis not present

## 2018-03-04 DIAGNOSIS — G309 Alzheimer's disease, unspecified: Secondary | ICD-10-CM | POA: Diagnosis not present

## 2018-03-04 DIAGNOSIS — F0280 Dementia in other diseases classified elsewhere without behavioral disturbance: Secondary | ICD-10-CM | POA: Diagnosis not present

## 2018-03-04 DIAGNOSIS — E1169 Type 2 diabetes mellitus with other specified complication: Secondary | ICD-10-CM | POA: Diagnosis not present

## 2018-03-05 DIAGNOSIS — H01009 Unspecified blepharitis unspecified eye, unspecified eyelid: Secondary | ICD-10-CM | POA: Diagnosis not present

## 2018-03-05 DIAGNOSIS — H04123 Dry eye syndrome of bilateral lacrimal glands: Secondary | ICD-10-CM | POA: Diagnosis not present

## 2018-03-05 DIAGNOSIS — H04213 Epiphora due to excess lacrimation, bilateral lacrimal glands: Secondary | ICD-10-CM | POA: Diagnosis not present

## 2018-03-05 DIAGNOSIS — H401134 Primary open-angle glaucoma, bilateral, indeterminate stage: Secondary | ICD-10-CM | POA: Diagnosis not present

## 2018-03-05 DIAGNOSIS — G309 Alzheimer's disease, unspecified: Secondary | ICD-10-CM | POA: Diagnosis not present

## 2018-03-06 DIAGNOSIS — G301 Alzheimer's disease with late onset: Secondary | ICD-10-CM | POA: Diagnosis not present

## 2018-03-06 DIAGNOSIS — M1611 Unilateral primary osteoarthritis, right hip: Secondary | ICD-10-CM | POA: Diagnosis not present

## 2018-03-06 DIAGNOSIS — L03116 Cellulitis of left lower limb: Secondary | ICD-10-CM | POA: Diagnosis not present

## 2018-03-06 DIAGNOSIS — G309 Alzheimer's disease, unspecified: Secondary | ICD-10-CM | POA: Diagnosis not present

## 2018-03-06 DIAGNOSIS — E1169 Type 2 diabetes mellitus with other specified complication: Secondary | ICD-10-CM | POA: Diagnosis not present

## 2018-03-06 DIAGNOSIS — L03115 Cellulitis of right lower limb: Secondary | ICD-10-CM | POA: Diagnosis not present

## 2018-03-06 DIAGNOSIS — F0280 Dementia in other diseases classified elsewhere without behavioral disturbance: Secondary | ICD-10-CM | POA: Diagnosis not present

## 2018-03-07 DIAGNOSIS — G309 Alzheimer's disease, unspecified: Secondary | ICD-10-CM | POA: Diagnosis not present

## 2018-03-08 DIAGNOSIS — G309 Alzheimer's disease, unspecified: Secondary | ICD-10-CM | POA: Diagnosis not present

## 2018-03-09 DIAGNOSIS — G309 Alzheimer's disease, unspecified: Secondary | ICD-10-CM | POA: Diagnosis not present

## 2018-03-10 DIAGNOSIS — G301 Alzheimer's disease with late onset: Secondary | ICD-10-CM | POA: Diagnosis not present

## 2018-03-10 DIAGNOSIS — E1169 Type 2 diabetes mellitus with other specified complication: Secondary | ICD-10-CM | POA: Diagnosis not present

## 2018-03-10 DIAGNOSIS — L03115 Cellulitis of right lower limb: Secondary | ICD-10-CM | POA: Diagnosis not present

## 2018-03-10 DIAGNOSIS — M1611 Unilateral primary osteoarthritis, right hip: Secondary | ICD-10-CM | POA: Diagnosis not present

## 2018-03-10 DIAGNOSIS — L03116 Cellulitis of left lower limb: Secondary | ICD-10-CM | POA: Diagnosis not present

## 2018-03-10 DIAGNOSIS — F0280 Dementia in other diseases classified elsewhere without behavioral disturbance: Secondary | ICD-10-CM | POA: Diagnosis not present

## 2018-03-10 DIAGNOSIS — G309 Alzheimer's disease, unspecified: Secondary | ICD-10-CM | POA: Diagnosis not present

## 2018-03-11 DIAGNOSIS — G309 Alzheimer's disease, unspecified: Secondary | ICD-10-CM | POA: Diagnosis not present

## 2018-03-11 DIAGNOSIS — M1611 Unilateral primary osteoarthritis, right hip: Secondary | ICD-10-CM | POA: Diagnosis not present

## 2018-03-11 DIAGNOSIS — F0280 Dementia in other diseases classified elsewhere without behavioral disturbance: Secondary | ICD-10-CM | POA: Diagnosis not present

## 2018-03-11 DIAGNOSIS — L03116 Cellulitis of left lower limb: Secondary | ICD-10-CM | POA: Diagnosis not present

## 2018-03-11 DIAGNOSIS — E1169 Type 2 diabetes mellitus with other specified complication: Secondary | ICD-10-CM | POA: Diagnosis not present

## 2018-03-11 DIAGNOSIS — L03115 Cellulitis of right lower limb: Secondary | ICD-10-CM | POA: Diagnosis not present

## 2018-03-11 DIAGNOSIS — G301 Alzheimer's disease with late onset: Secondary | ICD-10-CM | POA: Diagnosis not present

## 2018-03-12 DIAGNOSIS — G309 Alzheimer's disease, unspecified: Secondary | ICD-10-CM | POA: Diagnosis not present

## 2018-03-13 DIAGNOSIS — L03115 Cellulitis of right lower limb: Secondary | ICD-10-CM | POA: Diagnosis not present

## 2018-03-13 DIAGNOSIS — E1169 Type 2 diabetes mellitus with other specified complication: Secondary | ICD-10-CM | POA: Diagnosis not present

## 2018-03-13 DIAGNOSIS — G301 Alzheimer's disease with late onset: Secondary | ICD-10-CM | POA: Diagnosis not present

## 2018-03-13 DIAGNOSIS — L03116 Cellulitis of left lower limb: Secondary | ICD-10-CM | POA: Diagnosis not present

## 2018-03-13 DIAGNOSIS — F0280 Dementia in other diseases classified elsewhere without behavioral disturbance: Secondary | ICD-10-CM | POA: Diagnosis not present

## 2018-03-13 DIAGNOSIS — G309 Alzheimer's disease, unspecified: Secondary | ICD-10-CM | POA: Diagnosis not present

## 2018-03-13 DIAGNOSIS — M1611 Unilateral primary osteoarthritis, right hip: Secondary | ICD-10-CM | POA: Diagnosis not present

## 2018-03-14 ENCOUNTER — Ambulatory Visit (INDEPENDENT_AMBULATORY_CARE_PROVIDER_SITE_OTHER): Payer: Medicare HMO | Admitting: Podiatry

## 2018-03-14 ENCOUNTER — Encounter: Payer: Self-pay | Admitting: Podiatry

## 2018-03-14 DIAGNOSIS — G301 Alzheimer's disease with late onset: Secondary | ICD-10-CM | POA: Diagnosis not present

## 2018-03-14 DIAGNOSIS — L03116 Cellulitis of left lower limb: Secondary | ICD-10-CM | POA: Diagnosis not present

## 2018-03-14 DIAGNOSIS — L03115 Cellulitis of right lower limb: Secondary | ICD-10-CM | POA: Diagnosis not present

## 2018-03-14 DIAGNOSIS — M1611 Unilateral primary osteoarthritis, right hip: Secondary | ICD-10-CM | POA: Diagnosis not present

## 2018-03-14 DIAGNOSIS — E1169 Type 2 diabetes mellitus with other specified complication: Secondary | ICD-10-CM | POA: Diagnosis not present

## 2018-03-14 DIAGNOSIS — G309 Alzheimer's disease, unspecified: Secondary | ICD-10-CM | POA: Diagnosis not present

## 2018-03-14 DIAGNOSIS — L97421 Non-pressure chronic ulcer of left heel and midfoot limited to breakdown of skin: Secondary | ICD-10-CM | POA: Diagnosis not present

## 2018-03-14 DIAGNOSIS — E08621 Diabetes mellitus due to underlying condition with foot ulcer: Secondary | ICD-10-CM

## 2018-03-14 DIAGNOSIS — F0280 Dementia in other diseases classified elsewhere without behavioral disturbance: Secondary | ICD-10-CM | POA: Diagnosis not present

## 2018-03-14 NOTE — Progress Notes (Signed)
0   Subjective:  Patient ID: [name], male    DOB: 07-24-1945,  MRN: [id number]  Chief Complaint  Patient presents with  . Foot Ulcer    left ball of foot is improving, very little drainage noticed    72 y.o. male presents for wound care. Thinks the foot is improving. Not having much drainage.   Review of Systems: Negative except as noted in the HPI. Denies N/V/F/Ch.  Past Medical History:  Diagnosis Date  . Coronary artery disease   . Diabetes mellitus    type II  . Gastritis   . Glaucoma    hx of  . Hypercholesterolemia   . Hypertension   . Insomnia   . Stroke (Major)    hx of multiple    Current Outpatient Medications:  .  aspirin 325 MG EC tablet, Take 325 mg by mouth daily.  , Disp: , Rfl:  .  atenolol (TENORMIN) 50 MG tablet, Take 50 mg by mouth. Take 1 1/2 tablet daily , Disp: , Rfl:  .  atorvastatin (LIPITOR) 40 MG tablet, , Disp: , Rfl:  .  carvedilol (COREG) 3.125 MG tablet, , Disp: , Rfl:  .  ciprofloxacin (CIPRO) 250 MG tablet, , Disp: , Rfl:  .  doxepin (SINEQUAN) 10 MG capsule, , Disp: , Rfl:  .  enalapril (VASOTEC) 2.5 MG tablet, Take 2.5 mg by mouth daily.  , Disp: , Rfl:  .  escitalopram (LEXAPRO) 10 MG tablet, , Disp: , Rfl:  .  furosemide (LASIX) 40 MG tablet, , Disp: , Rfl:  .  insulin glargine (LANTUS) 100 UNIT/ML injection, Inject 10 Units into the skin at bedtime.  , Disp: , Rfl:  .  insulin lispro (HUMALOG) 100 UNIT/ML injection, Inject 3 Units into the skin 3 (three) times daily before meals.  , Disp: , Rfl:  .  latanoprost (XALATAN) 0.005 % ophthalmic solution, , Disp: , Rfl:  .  lisinopril (PRINIVIL,ZESTRIL) 5 MG tablet, , Disp: , Rfl:  .  metFORMIN (GLUCOPHAGE) 1000 MG tablet, , Disp: , Rfl:  .  mupirocin ointment (BACTROBAN) 2 %, , Disp: , Rfl:  .  nitroGLYCERIN (NITROSTAT) 0.4 MG SL tablet, Place 0.4 mg under the tongue every 5 (five) minutes as needed.  , Disp: , Rfl:  .  NOVOLOG FLEXPEN 100 UNIT/ML FlexPen, , Disp: , Rfl:  .  NYSTATIN  powder, , Disp: , Rfl:  .  rosuvastatin (CRESTOR) 10 MG tablet, Take 10 mg by mouth daily.  , Disp: , Rfl:  .  SILENOR 3 MG TABS, , Disp: , Rfl:  .  traZODone (DESYREL) 50 MG tablet, , Disp: , Rfl:  .  valACYclovir (VALTREX) 1000 MG tablet, , Disp: , Rfl:   Social History   Tobacco Use  Smoking Status Never Smoker  Smokeless Tobacco Never Used    No Known Allergies Objective:  There were no vitals filed for this visit. There is no height or weight on file to calculate BMI. Constitutional Well developed. Well nourished.  Vascular Dorsalis pedis pulses palpable bilaterally. Posterior tibial pulses non-palpable bilaterally. Capillary refill normal to all digits.  No cyanosis or clubbing noted. Pedal hair growth absent.  Neurologic Normal speech. Oriented to person, place, and time. Protective sensation absent  Dermatologic Small venous stasis ulcerations present bilateral lower extremity Ulceration left sub-met 3 measuring 0.5x0.5 post-debridement.  Orthopedic: No pain to palpation either foot. History of amputation left great toe   Radiographs: None today Assessment:   No  diagnosis found. Plan:  Patient was evaluated and treated and all questions answered.  Venous ulcer bilateral lower extremity -Continue tubigrip  Ulceration left third metatarsal with history of hallux amputation -Debrided as below. -Dressed with medihoney and DSD -Continue surgical shoe.  Procedure: Excisional Debridement of Wound Rationale: Removal of non-viable soft tissue from the wound to promote healing.  Anesthesia: none Pre-Debridement Wound Measurements: 0.3 cm x 0.3 cm x 0.1 cm  Post-Debridement Wound Measurements: 0.5 cm x 0.5 cm x 0.1 cm  Type of Debridement: Sharp Excisional Tissue Removed: Non-viable soft tissue Depth of Debridement: subcutaneous tissue. Technique: Sharp excisional debridement to bleeding, viable wound base.  Dressing: Dry, sterile, compression  dressing. Disposition: Patient tolerated procedure well. Patient to return in 1 week for follow-up.      Return in about 3 weeks (around 04/04/2018) for Wound Care, Left.

## 2018-03-14 NOTE — Patient Instructions (Signed)
Continue antibiotic ointment and band-aid daily to left foot wound. Continue tubigrip.

## 2018-03-15 DIAGNOSIS — G309 Alzheimer's disease, unspecified: Secondary | ICD-10-CM | POA: Diagnosis not present

## 2018-03-16 DIAGNOSIS — G309 Alzheimer's disease, unspecified: Secondary | ICD-10-CM | POA: Diagnosis not present

## 2018-03-17 DIAGNOSIS — G309 Alzheimer's disease, unspecified: Secondary | ICD-10-CM | POA: Diagnosis not present

## 2018-03-17 DIAGNOSIS — E08621 Diabetes mellitus due to underlying condition with foot ulcer: Secondary | ICD-10-CM | POA: Diagnosis not present

## 2018-03-17 DIAGNOSIS — M1611 Unilateral primary osteoarthritis, right hip: Secondary | ICD-10-CM | POA: Diagnosis not present

## 2018-03-17 DIAGNOSIS — B0239 Other herpes zoster eye disease: Secondary | ICD-10-CM | POA: Diagnosis not present

## 2018-03-17 DIAGNOSIS — E1169 Type 2 diabetes mellitus with other specified complication: Secondary | ICD-10-CM | POA: Diagnosis not present

## 2018-03-17 DIAGNOSIS — G301 Alzheimer's disease with late onset: Secondary | ICD-10-CM | POA: Diagnosis not present

## 2018-03-17 DIAGNOSIS — F0280 Dementia in other diseases classified elsewhere without behavioral disturbance: Secondary | ICD-10-CM | POA: Diagnosis not present

## 2018-03-17 DIAGNOSIS — L03116 Cellulitis of left lower limb: Secondary | ICD-10-CM | POA: Diagnosis not present

## 2018-03-17 DIAGNOSIS — L03115 Cellulitis of right lower limb: Secondary | ICD-10-CM | POA: Diagnosis not present

## 2018-03-18 DIAGNOSIS — G309 Alzheimer's disease, unspecified: Secondary | ICD-10-CM | POA: Diagnosis not present

## 2018-03-19 DIAGNOSIS — G309 Alzheimer's disease, unspecified: Secondary | ICD-10-CM | POA: Diagnosis not present

## 2018-03-20 DIAGNOSIS — M1611 Unilateral primary osteoarthritis, right hip: Secondary | ICD-10-CM | POA: Diagnosis not present

## 2018-03-20 DIAGNOSIS — L03116 Cellulitis of left lower limb: Secondary | ICD-10-CM | POA: Diagnosis not present

## 2018-03-20 DIAGNOSIS — G301 Alzheimer's disease with late onset: Secondary | ICD-10-CM | POA: Diagnosis not present

## 2018-03-20 DIAGNOSIS — E1169 Type 2 diabetes mellitus with other specified complication: Secondary | ICD-10-CM | POA: Diagnosis not present

## 2018-03-20 DIAGNOSIS — F0280 Dementia in other diseases classified elsewhere without behavioral disturbance: Secondary | ICD-10-CM | POA: Diagnosis not present

## 2018-03-20 DIAGNOSIS — G309 Alzheimer's disease, unspecified: Secondary | ICD-10-CM | POA: Diagnosis not present

## 2018-03-20 DIAGNOSIS — L03115 Cellulitis of right lower limb: Secondary | ICD-10-CM | POA: Diagnosis not present

## 2018-03-21 DIAGNOSIS — G309 Alzheimer's disease, unspecified: Secondary | ICD-10-CM | POA: Diagnosis not present

## 2018-03-22 DIAGNOSIS — G309 Alzheimer's disease, unspecified: Secondary | ICD-10-CM | POA: Diagnosis not present

## 2018-03-23 DIAGNOSIS — G309 Alzheimer's disease, unspecified: Secondary | ICD-10-CM | POA: Diagnosis not present

## 2018-03-24 DIAGNOSIS — G301 Alzheimer's disease with late onset: Secondary | ICD-10-CM | POA: Diagnosis not present

## 2018-03-24 DIAGNOSIS — M1611 Unilateral primary osteoarthritis, right hip: Secondary | ICD-10-CM | POA: Diagnosis not present

## 2018-03-24 DIAGNOSIS — F0280 Dementia in other diseases classified elsewhere without behavioral disturbance: Secondary | ICD-10-CM | POA: Diagnosis not present

## 2018-03-24 DIAGNOSIS — E1169 Type 2 diabetes mellitus with other specified complication: Secondary | ICD-10-CM | POA: Diagnosis not present

## 2018-03-24 DIAGNOSIS — L03116 Cellulitis of left lower limb: Secondary | ICD-10-CM | POA: Diagnosis not present

## 2018-03-24 DIAGNOSIS — G309 Alzheimer's disease, unspecified: Secondary | ICD-10-CM | POA: Diagnosis not present

## 2018-03-24 DIAGNOSIS — L03115 Cellulitis of right lower limb: Secondary | ICD-10-CM | POA: Diagnosis not present

## 2018-03-25 DIAGNOSIS — G309 Alzheimer's disease, unspecified: Secondary | ICD-10-CM | POA: Diagnosis not present

## 2018-03-26 DIAGNOSIS — G309 Alzheimer's disease, unspecified: Secondary | ICD-10-CM | POA: Diagnosis not present

## 2018-03-27 DIAGNOSIS — M1611 Unilateral primary osteoarthritis, right hip: Secondary | ICD-10-CM | POA: Diagnosis not present

## 2018-03-27 DIAGNOSIS — G309 Alzheimer's disease, unspecified: Secondary | ICD-10-CM | POA: Diagnosis not present

## 2018-03-27 DIAGNOSIS — E1169 Type 2 diabetes mellitus with other specified complication: Secondary | ICD-10-CM | POA: Diagnosis not present

## 2018-03-27 DIAGNOSIS — L03116 Cellulitis of left lower limb: Secondary | ICD-10-CM | POA: Diagnosis not present

## 2018-03-27 DIAGNOSIS — L03115 Cellulitis of right lower limb: Secondary | ICD-10-CM | POA: Diagnosis not present

## 2018-03-27 DIAGNOSIS — F0280 Dementia in other diseases classified elsewhere without behavioral disturbance: Secondary | ICD-10-CM | POA: Diagnosis not present

## 2018-03-27 DIAGNOSIS — G301 Alzheimer's disease with late onset: Secondary | ICD-10-CM | POA: Diagnosis not present

## 2018-03-28 DIAGNOSIS — G309 Alzheimer's disease, unspecified: Secondary | ICD-10-CM | POA: Diagnosis not present

## 2018-03-29 DIAGNOSIS — G309 Alzheimer's disease, unspecified: Secondary | ICD-10-CM | POA: Diagnosis not present

## 2018-03-30 DIAGNOSIS — G309 Alzheimer's disease, unspecified: Secondary | ICD-10-CM | POA: Diagnosis not present

## 2018-03-31 ENCOUNTER — Emergency Department (HOSPITAL_COMMUNITY): Payer: Medicare HMO

## 2018-03-31 ENCOUNTER — Other Ambulatory Visit: Payer: Self-pay

## 2018-03-31 ENCOUNTER — Encounter (HOSPITAL_COMMUNITY): Payer: Self-pay

## 2018-03-31 ENCOUNTER — Emergency Department (HOSPITAL_COMMUNITY)
Admission: EM | Admit: 2018-03-31 | Discharge: 2018-03-31 | Disposition: A | Discharge status: Home / Self Care | Payer: Medicare HMO | Attending: Emergency Medicine | Admitting: Emergency Medicine

## 2018-03-31 DIAGNOSIS — Z79899 Other long term (current) drug therapy: Secondary | ICD-10-CM | POA: Insufficient documentation

## 2018-03-31 DIAGNOSIS — Z794 Long term (current) use of insulin: Secondary | ICD-10-CM | POA: Insufficient documentation

## 2018-03-31 DIAGNOSIS — S299XXA Unspecified injury of thorax, initial encounter: Secondary | ICD-10-CM | POA: Diagnosis not present

## 2018-03-31 DIAGNOSIS — E161 Other hypoglycemia: Secondary | ICD-10-CM | POA: Diagnosis not present

## 2018-03-31 DIAGNOSIS — Z7982 Long term (current) use of aspirin: Secondary | ICD-10-CM | POA: Diagnosis not present

## 2018-03-31 DIAGNOSIS — I10 Essential (primary) hypertension: Secondary | ICD-10-CM | POA: Insufficient documentation

## 2018-03-31 DIAGNOSIS — E162 Hypoglycemia, unspecified: Secondary | ICD-10-CM | POA: Diagnosis not present

## 2018-03-31 DIAGNOSIS — I2510 Atherosclerotic heart disease of native coronary artery without angina pectoris: Secondary | ICD-10-CM | POA: Diagnosis not present

## 2018-03-31 DIAGNOSIS — E119 Type 2 diabetes mellitus without complications: Secondary | ICD-10-CM | POA: Insufficient documentation

## 2018-03-31 DIAGNOSIS — E1165 Type 2 diabetes mellitus with hyperglycemia: Secondary | ICD-10-CM | POA: Diagnosis not present

## 2018-03-31 DIAGNOSIS — Z951 Presence of aortocoronary bypass graft: Secondary | ICD-10-CM | POA: Insufficient documentation

## 2018-03-31 DIAGNOSIS — I491 Atrial premature depolarization: Secondary | ICD-10-CM | POA: Diagnosis not present

## 2018-03-31 DIAGNOSIS — R55 Syncope and collapse: Secondary | ICD-10-CM | POA: Diagnosis not present

## 2018-03-31 DIAGNOSIS — I447 Left bundle-branch block, unspecified: Secondary | ICD-10-CM | POA: Diagnosis not present

## 2018-03-31 DIAGNOSIS — S0990XA Unspecified injury of head, initial encounter: Secondary | ICD-10-CM | POA: Diagnosis not present

## 2018-03-31 DIAGNOSIS — S199XXA Unspecified injury of neck, initial encounter: Secondary | ICD-10-CM | POA: Diagnosis not present

## 2018-03-31 DIAGNOSIS — R404 Transient alteration of awareness: Secondary | ICD-10-CM | POA: Diagnosis not present

## 2018-03-31 DIAGNOSIS — G309 Alzheimer's disease, unspecified: Secondary | ICD-10-CM | POA: Diagnosis not present

## 2018-03-31 LAB — URINALYSIS, ROUTINE W REFLEX MICROSCOPIC
BILIRUBIN URINE: NEGATIVE
Glucose, UA: NEGATIVE mg/dL
Hgb urine dipstick: NEGATIVE
KETONES UR: NEGATIVE mg/dL
LEUKOCYTES UA: NEGATIVE
NITRITE: NEGATIVE
Protein, ur: 100 mg/dL — AB
Specific Gravity, Urine: 1.01 (ref 1.005–1.030)
pH: 7 (ref 5.0–8.0)

## 2018-03-31 LAB — CBC WITH DIFFERENTIAL/PLATELET
ABS IMMATURE GRANULOCYTES: 0.05 10*3/uL (ref 0.00–0.07)
BASOS ABS: 0.1 10*3/uL (ref 0.0–0.1)
Basophils Relative: 1 %
Eosinophils Absolute: 0.5 10*3/uL (ref 0.0–0.5)
Eosinophils Relative: 4 %
HEMATOCRIT: 33.2 % — AB (ref 39.0–52.0)
HEMOGLOBIN: 10.5 g/dL — AB (ref 13.0–17.0)
IMMATURE GRANULOCYTES: 0 %
LYMPHS ABS: 3 10*3/uL (ref 0.7–4.0)
LYMPHS PCT: 24 %
MCH: 29.7 pg (ref 26.0–34.0)
MCHC: 31.6 g/dL (ref 30.0–36.0)
MCV: 94.1 fL (ref 80.0–100.0)
Monocytes Absolute: 1.4 10*3/uL — ABNORMAL HIGH (ref 0.1–1.0)
Monocytes Relative: 11 %
NEUTROS ABS: 7.9 10*3/uL — AB (ref 1.7–7.7)
NEUTROS PCT: 60 %
NRBC: 0 % (ref 0.0–0.2)
Platelets: 326 10*3/uL (ref 150–400)
RBC: 3.53 MIL/uL — ABNORMAL LOW (ref 4.22–5.81)
RDW: 14.9 % (ref 11.5–15.5)
WBC: 12.8 10*3/uL — ABNORMAL HIGH (ref 4.0–10.5)

## 2018-03-31 LAB — BASIC METABOLIC PANEL
ANION GAP: 12 (ref 5–15)
BUN: 27 mg/dL — ABNORMAL HIGH (ref 8–23)
CHLORIDE: 105 mmol/L (ref 98–111)
CO2: 24 mmol/L (ref 22–32)
Calcium: 9.3 mg/dL (ref 8.9–10.3)
Creatinine, Ser: 1.09 mg/dL (ref 0.61–1.24)
GFR calc non Af Amer: >60 mL/min (ref >60)
Glucose, Bld: 56 mg/dL — ABNORMAL LOW (ref 70–99)
Potassium: 4.1 mmol/L (ref 3.5–5.1)
Sodium: 141 mmol/L (ref 135–145)

## 2018-03-31 LAB — CBG MONITORING, ED
GLUCOSE-CAPILLARY: 159 mg/dL — AB (ref 70–99)
GLUCOSE-CAPILLARY: 190 mg/dL — AB (ref 70–99)
Glucose-Capillary: 64 mg/dL — ABNORMAL LOW (ref 70–99)
Glucose-Capillary: 62 mg/dL — ABNORMAL LOW (ref 70–99)
Glucose-Capillary: 142 mg/dL — ABNORMAL HIGH (ref 70–99)

## 2018-03-31 NOTE — ED Notes (Signed)
Pt given two cups of orange juice and graham crackers with peanut butter per RN, Millie. Meal Tray has not been delivered yet.

## 2018-03-31 NOTE — ED Notes (Signed)
Meal Tray delivered.

## 2018-03-31 NOTE — Discharge Instructions (Signed)
Please switch patient's morning Lantus dose to 5 units instead of 14. He will need to follow up with his primary car provider for further management and medication changes. Return to ED for worsening symptoms, additional injuries or falls, numbness in arms or legs, fever, vomiting.

## 2018-03-31 NOTE — ED Notes (Signed)
Pt eating lunch and tolerating well. 

## 2018-03-31 NOTE — ED Notes (Addendum)
warm blankets applied.Dressings noted to bilateral feet. Pt arrives with yellow socks in place.

## 2018-03-31 NOTE — ED Notes (Signed)
Pt given po orange juice per VO. Shelly Coss

## 2018-03-31 NOTE — ED Notes (Signed)
Pt continues to climb out of the bed without assistance. RN, Millie, reiterated to pt the need to use his call bell when he is in need of anything.

## 2018-03-31 NOTE — ED Notes (Signed)
CBG collected. Result "159." EDP notified.

## 2018-03-31 NOTE — ED Notes (Signed)
Pt attempting to stand at foot of bed without assist. Pt again urged to call for help with call bell at side Pt assited x 2 to stand and void in urinal for approx 50cc urine. Wound at right shin noted as bleeding. Cleaned and dressed with non stick dressing and 4x4.

## 2018-03-31 NOTE — ED Triage Notes (Addendum)
Pt arrives EMS from [facility] where pt was found laying beside bed with clothing and shoes in place . Un known fall. First responder found CBG of 36. Pt given 150 cc D10 in route with repeat CBG 103. Arrival CBG 64. Rash noted on right anterior axilla.

## 2018-03-31 NOTE — ED Notes (Signed)
Meal Tray ordered.  

## 2018-03-31 NOTE — ED Provider Notes (Signed)
[facility] EMERGENCY DEPARTMENT Provider Note   CSN: [id number] Arrival date & time: 03/31/18  0806     History   Chief Complaint Chief Complaint  Patient presents with  . Fall    HPI [name] is a 72 y.o. male with a past medical history of IDDM, HTN, prior CVA, presents to ED for syncope and unwitnessed fall that occurred PTA. Patient resides at a nursing facility when staff found him laying down beside the bed fully clothed with shoes on. EMS found CBG of 36. This improved with D10 given to CBG of 103. According to [facility], patient had CBG of 73 this morning and was given 14 units of Lantus afterwards. Received his bid dose of metformin last night. States he does not think he ate breakfast. Usually he does not skip meals. He denies any chest pain, SOB, headache, vision changes, neck pain, numbness in arms or legs, fever, abdominal pain, vomiting, diarrhea.   HPI  Past Medical History:  Diagnosis Date  . Coronary artery disease   . Diabetes mellitus    type II  . Gastritis   . Glaucoma    hx of  . Hypercholesterolemia   . Hypertension   . Insomnia   . Stroke [facility])    hx of multiple    Patient Active Problem List   Diagnosis Date Noted  . CORONARY ARTERY BYPASS GRAFT, HX OF 05/25/2006  . DIABETES MELLITUS, TYPE II 04/13/2006  . HYPERCHOLESTEROLEMIA 04/13/2006  . INCREASED INTRAOCULAR PRESSURE 04/13/2006  . HYPERTENSION 04/13/2006  . CORONARY ARTERY DISEASE 04/13/2006  . INSOMNIA 04/13/2006  . CHEST PAIN 04/13/2006  . EPIGASTRIC PAIN 04/13/2006  . CEREBROVASCULAR ACCIDENT, HX OF 04/13/2006  . GLAUCOMA, HX OF 04/13/2006    Past Surgical History:  Procedure Laterality Date  . CATARACT EXTRACTION    . CORONARY ARTERY BYPASS GRAFT  2006   LIMA to LAD; SVG gto diagonal; SVG to ramus/L PLSA)        Home Medications    Prior to Admission medications   Medication Sig Start Date End Date Taking? Authorizing Provider  acetaminophen (TYLENOL)  325 MG tablet Take 650 mg by mouth 2 (two) times daily.   Yes [provider]  Acetaminophen 325 MG CAPS Take 650 mg by mouth every 4 (four) hours as needed for mild pain.   Yes [provider]  aspirin 81 MG chewable tablet Chew 81 mg by mouth daily.    Yes [provider]  atorvastatin (LIPITOR) 40 MG tablet Take 40 mg by mouth at bedtime.    Yes [provider]  carvedilol (COREG) 3.125 MG tablet Take 3.125 mg by mouth 2 (two) times daily.  02/27/18  Yes [provider]  doxepin (SINEQUAN) 10 MG capsule Take 10 mg by mouth at bedtime.  02/27/18  Yes [provider]  escitalopram (LEXAPRO) 10 MG tablet Take 10 mg by mouth daily.  02/27/18  Yes [provider]  ferrous gluconate (FERGON) 324 MG tablet Take 324 mg by mouth daily.   Yes [provider]  furosemide (LASIX) 40 MG tablet Take 40 mg by mouth daily.    Yes [provider]  insulin glargine (LANTUS) 100 UNIT/ML injection Inject 5-14 Units into the skin See admin instructions. Inject 14 units every morning and 5 units every night at bedtime.   Yes [provider]  lisinopril (PRINIVIL,ZESTRIL) 5 MG tablet Take 5 mg by mouth daily.  02/27/18  Yes [provider]  metFORMIN (GLUCOPHAGE) 1000 MG tablet Take 1,000 mg by mouth 2 (two) times daily.  03/04/18  Yes [provider]    Family History Family History  Problem Relation Age of Onset  . Diabetes Father 64  . Other Mother 2       alive    Social History Social History   Tobacco Use  . Smoking status: Never Smoker  . Smokeless tobacco: Never Used  Substance Use Topics  . Alcohol use: Never    Frequency: Never  . Drug use: Never     Allergies   Patient has no known allergies.   Review of Systems Review of Systems  Constitutional: Negative for appetite change, chills and fever.  HENT: Negative for ear pain, rhinorrhea, sneezing and sore throat.   Eyes: Negative for  photophobia and visual disturbance.  Respiratory: Negative for cough, chest tightness, shortness of breath and wheezing.   Cardiovascular: Negative for chest pain and palpitations.  Gastrointestinal: Negative for abdominal pain, blood in stool, constipation, diarrhea, nausea and vomiting.  Genitourinary: Negative for dysuria, hematuria and urgency.  Musculoskeletal: Negative for myalgias.  Skin: Negative for rash.  Neurological: Positive for syncope. Negative for dizziness, weakness and light-headedness.     Physical Exam Updated Vital Signs BP (!) 170/83   Pulse 67   Temp (!) 96.1 F (35.6 C) (Rectal)   Resp (!) 23   Ht 5\' 11"  (1.803 m)   Wt 80.7 kg   SpO2 98%   BMI 24.83 kg/m   Physical Exam  Constitutional: He is oriented to person, place, and time. He appears well-developed and well-nourished. No distress.  HENT:  Head: Normocephalic and atraumatic.  Nose: Nose normal.  Eyes: Conjunctivae and EOM are normal. Left eye exhibits no discharge. No scleral icterus.  Neck: Normal range of motion. Neck supple.  Cardiovascular: Normal rate, regular rhythm, normal heart sounds and intact distal pulses. Exam reveals no gallop and no friction rub.  No murmur heard. Pulmonary/Chest: Effort normal and breath sounds normal. No respiratory distress.  Abdominal: Soft. Bowel sounds are normal. He exhibits no distension. There is no tenderness. There is no guarding.  Musculoskeletal: Normal range of motion. He exhibits no edema.  No midline spinal tenderness present in lumbar, thoracic or cervical spine. No step-off palpated.   Neurological: He is alert and oriented to person, place, and time. No cranial nerve deficit or sensory deficit. He exhibits normal muscle tone. Coordination normal.  Alert to self, year, location and situation. Pupils reactive. No facial asymmetry noted. Cranial nerves appear grossly intact. Sensation intact to light touch on face, BUE and BLE. Strength 5/5 in BUE.    Skin: Skin is warm and dry. No rash noted.  Psychiatric: He has a normal mood and affect.  Nursing note and vitals reviewed.    ED Treatments / Results  Labs (all labs ordered are listed, but only abnormal results are displayed) Labs Reviewed  BASIC METABOLIC PANEL - Abnormal; Notable for the following components:      Result Value   Glucose, Bld 56 (*)    BUN 27 (*)    All other components within normal limits  CBC WITH DIFFERENTIAL/PLATELET - Abnormal; Notable for the following components:   WBC 12.8 (*)    RBC 3.53 (*)    Hemoglobin 10.5 (*)    HCT 33.2 (*)    Neutro Abs 7.9 (*)    Monocytes Absolute 1.4 (*)    All other components within normal limits  URINALYSIS, ROUTINE  W REFLEX MICROSCOPIC - Abnormal; Notable for the following components:   Color, Urine STRAW (*)    Protein, ur 100 (*)    Bacteria, UA RARE (*)    All other components within normal limits  CBG MONITORING, ED - Abnormal; Notable for the following components:   Glucose-Capillary 64 (*)    All other components within normal limits  CBG MONITORING, ED - Abnormal; Notable for the following components:   Glucose-Capillary 62 (*)    All other components within normal limits  CBG MONITORING, ED - Abnormal; Notable for the following components:   Glucose-Capillary 142 (*)    All other components within normal limits  CBG MONITORING, ED - Abnormal; Notable for the following components:   Glucose-Capillary 159 (*)    All other components within normal limits  URINE CULTURE    EKG EKG Interpretation  Date/Time:  Monday March 31 2018 08:29:07 EDT Ventricular Rate:  74 PR Interval:    QRS Duration: 93 QT Interval:  394 QTC Calculation: 438 R Axis:   69 Text Interpretation:  Sinus rhythm Atrial premature complexes Inferior infarct, age indeterminate Consider anterior infarct Confirmed by Gerlene Fee [phone]) on 03/31/2018 8:54:01 AM   Radiology Dg Chest 2 View  Result Date: 03/31/2018 CLINICAL  DATA:  Fall EXAM: CHEST - 2 VIEW COMPARISON:  12/22/2017 FINDINGS: Prior CABG. Heart and mediastinal contours are within normal limits. No focal opacities or effusions. No acute bony abnormality. IMPRESSION: CABG.  No active cardiopulmonary disease. Electronically Signed   By: Rolm Baptise M.D.   On: 03/31/2018 09:08   Ct Head Wo Contrast  Result Date: 03/31/2018 CLINICAL DATA:  Patient found lying on floor beside his bed in nursing home. EXAM: CT HEAD WITHOUT CONTRAST CT CERVICAL SPINE WITHOUT CONTRAST TECHNIQUE: Multidetector CT imaging of the head and cervical spine was performed following the standard protocol without intravenous contrast. Multiplanar CT image reconstructions of the cervical spine were also generated. COMPARISON:  Head CT - 12/22/2017; 04/05/2008; head and C-spine CT - 06/25/2006 FINDINGS: CT HEAD FINDINGS Brain: Similar findings of atrophy with sulcal prominence and centralized volume loss with commensurate ex vacuo dilatation the ventricular system. Stable sequela of prior large right parietooccipital lobe infarct with associated encephalomalacia and ex vacuo dilatation of the occipital horn of the right lateral ventricle. Re demonstrated rather extensive periventricular-as compatible microvascular ischemic disease. Given extensive background parenchymal abnormalities, there is no CT evidence of superimposed acute large territory infarct. No intraparenchymal or extra-axial mass or hemorrhage. Unchanged size and configuration of the ventricles and basilar cisterns. No midline shift. Vascular: Intracranial atherosclerosis. Skull: No displaced calvarial fracture. Sinuses/Orbits: Limited visualization of the paranasal sinuses and mastoid air cells is normal. No air-fluid levels. Post bilateral cataract surgery. Other: Regional soft tissues appear normal. CT CERVICAL SPINE FINDINGS Alignment: C1 to the superior endplate of T6 is imaged. Normal alignment of the cervical spine. No  anterolisthesis or retrolisthesis. The bilateral facets are normally aligned. Mild degenerative change atlantodental articulation with joint space loss and subchondral sclerosis. The dens is normally positioned between the lateral masses of C1. Normal atlantodental and atlantoaxial articulations. The bilateral facets are normally aligned. Skull base and vertebrae: Cervical vertebral body heights are preserved. Note is made of fusion of the bilateral C2-C3 facets. Soft tissues and spinal canal: Prevertebral soft tissues are normal. Disc levels: Mild multilevel cervical spine DDD, worse at C3-C4 and C6-C7 with disc space height loss, endplate irregularity and sclerosis. Upper chest: Limited visualization of the lung apices demonstrates  focal bronchiectasis involving a segmental bronchi within the medial aspect of the right lung apex (image 90, series 4), without associated bronchial wall thickening. Sequela of prior median sternotomy. Other: No bulky cervical lymphadenopathy on this noncontrast examination. Atherosclerotic plaque within the left carotid bulb. Normal noncontrast appearance of the thyroid gland. IMPRESSION: 1. Similar findings of atrophy, microvascular ischemic disease and prior right parietoccipital lobe infarct without superimposed acute intracranial process. 2. No fracture or static subluxation of the cervical spine. 3. Mild multilevel cervical spine DDD. Electronically Signed   By: Sandi Mariscal M.D.   On: 03/31/2018 10:01   Ct Cervical Spine Wo Contrast  Result Date: 03/31/2018 CLINICAL DATA:  Patient found lying on floor beside his bed in nursing home. EXAM: CT HEAD WITHOUT CONTRAST CT CERVICAL SPINE WITHOUT CONTRAST TECHNIQUE: Multidetector CT imaging of the head and cervical spine was performed following the standard protocol without intravenous contrast. Multiplanar CT image reconstructions of the cervical spine were also generated. COMPARISON:  Head CT - 12/22/2017; 04/05/2008; head and  C-spine CT - 06/25/2006 FINDINGS: CT HEAD FINDINGS Brain: Similar findings of atrophy with sulcal prominence and centralized volume loss with commensurate ex vacuo dilatation the ventricular system. Stable sequela of prior large right parietooccipital lobe infarct with associated encephalomalacia and ex vacuo dilatation of the occipital horn of the right lateral ventricle. Re demonstrated rather extensive periventricular-as compatible microvascular ischemic disease. Given extensive background parenchymal abnormalities, there is no CT evidence of superimposed acute large territory infarct. No intraparenchymal or extra-axial mass or hemorrhage. Unchanged size and configuration of the ventricles and basilar cisterns. No midline shift. Vascular: Intracranial atherosclerosis. Skull: No displaced calvarial fracture. Sinuses/Orbits: Limited visualization of the paranasal sinuses and mastoid air cells is normal. No air-fluid levels. Post bilateral cataract surgery. Other: Regional soft tissues appear normal. CT CERVICAL SPINE FINDINGS Alignment: C1 to the superior endplate of T6 is imaged. Normal alignment of the cervical spine. No anterolisthesis or retrolisthesis. The bilateral facets are normally aligned. Mild degenerative change atlantodental articulation with joint space loss and subchondral sclerosis. The dens is normally positioned between the lateral masses of C1. Normal atlantodental and atlantoaxial articulations. The bilateral facets are normally aligned. Skull base and vertebrae: Cervical vertebral body heights are preserved. Note is made of fusion of the bilateral C2-C3 facets. Soft tissues and spinal canal: Prevertebral soft tissues are normal. Disc levels: Mild multilevel cervical spine DDD, worse at C3-C4 and C6-C7 with disc space height loss, endplate irregularity and sclerosis. Upper chest: Limited visualization of the lung apices demonstrates focal bronchiectasis involving a segmental bronchi within the  medial aspect of the right lung apex (image 90, series 4), without associated bronchial wall thickening. Sequela of prior median sternotomy. Other: No bulky cervical lymphadenopathy on this noncontrast examination. Atherosclerotic plaque within the left carotid bulb. Normal noncontrast appearance of the thyroid gland. IMPRESSION: 1. Similar findings of atrophy, microvascular ischemic disease and prior right parietoccipital lobe infarct without superimposed acute intracranial process. 2. No fracture or static subluxation of the cervical spine. 3. Mild multilevel cervical spine DDD. Electronically Signed   By: Sandi Mariscal M.D.   On: 03/31/2018 10:01    Procedures Procedures (including critical care time)  Medications Ordered in ED Medications - No data to display   Initial Impression / Assessment and Plan / ED Course  I have reviewed the triage vital signs and the nursing notes.  Pertinent labs & imaging results that were available during my care of the patient were reviewed by me and considered  in my medical decision making (see chart for details).  Clinical Course as of Mar 31 1541  Mon Mar 31, 2018  1542 Oral temp improved to 97.7   [HK]    Clinical Course User Index [HK] Delia Heady, [place]    72yo male with a pmh of IDDM, presents to ED for evaluation of unwitnessed fall and hypoglycemia.  Patient was found laying on his floor, fully clothed with shoes on.  EMS found CBG of 36.  Of note, patient's blood sugar was 73 this morning and he was given 15 units of Lantus which is his usual morning dose.  He forgot to eat breakfast.  Denies any headache, neck pain, numbness in arms or legs.  Facility states that mental status is at his baseline.  There is no C, T or L-spine tenderness to palpation noted.  No facial asymmetry noted.  However, patient appears to be a poor historian so CT of the head and neck were ordered.  This was negative.  Chest x-ray is unremarkable.  EKG shows sinus rhythm with  PACs.  BMP, CBC, urinalysis unremarkable.  Urine sent for culture.  Patient CBG improved to 190.  Prior to discharge.  I attempted to call the facility that he resides in to note the new changes to his insulin regimen.  He will need to be switched to only 5 units every morning.  I reviewed his CBGs and they tend to run in the 70s to 80s range every morning.  I do not feel that he needs 15 units of Lantus with his blood sugar being this low.  Nevertheless, he will still need to be followed by his primary care provider for any further medication changes.  I was not able to get in touch at this facility.  However, I did put these instructions on his discharge paperwork.  Advised to return to ED for any severe worsening symptoms. Patient discussed with and seen by my attending, Dr. Sedonia Small.  Portions of this note were generated with [employer]. Dictation errors may occur despite best attempts at proofreading.   Final Clinical Impressions(s) / ED Diagnoses   Final diagnoses:  Hypoglycemia    ED Discharge Orders    None       Delia Heady, PA-C 03/31/18 1542    Maudie Flakes, MD 03/31/18 1645

## 2018-03-31 NOTE — ED Notes (Signed)
Patient transported to CT 

## 2018-03-31 NOTE — ED Notes (Signed)
Pt given more orange juice. Tolerates well.

## 2018-03-31 NOTE — ED Notes (Signed)
Stocking at bilateral lower extremities removed with provider. dibetic ulcer noted at botom of left foot with amputated left great toe. 1 inch laceration with no bleeding noted at left heel. Redness noted to left foot at base of dorsal toes. 1/2 dollar size venous ulcer noted to right shin.,covered with 4x4. No bleeding or drainage noted. Bilateral positive dp pulses.

## 2018-03-31 NOTE — ED Notes (Signed)
CBG collected. Result "190." EDP notified.

## 2018-04-01 DIAGNOSIS — G309 Alzheimer's disease, unspecified: Secondary | ICD-10-CM | POA: Diagnosis not present

## 2018-04-01 LAB — URINE CULTURE: Culture: <10000 — AB

## 2018-04-02 DIAGNOSIS — G301 Alzheimer's disease with late onset: Secondary | ICD-10-CM | POA: Diagnosis not present

## 2018-04-02 DIAGNOSIS — E1169 Type 2 diabetes mellitus with other specified complication: Secondary | ICD-10-CM | POA: Diagnosis not present

## 2018-04-02 DIAGNOSIS — L03115 Cellulitis of right lower limb: Secondary | ICD-10-CM | POA: Diagnosis not present

## 2018-04-02 DIAGNOSIS — F0280 Dementia in other diseases classified elsewhere without behavioral disturbance: Secondary | ICD-10-CM | POA: Diagnosis not present

## 2018-04-02 DIAGNOSIS — L03116 Cellulitis of left lower limb: Secondary | ICD-10-CM | POA: Diagnosis not present

## 2018-04-02 DIAGNOSIS — G309 Alzheimer's disease, unspecified: Secondary | ICD-10-CM | POA: Diagnosis not present

## 2018-04-02 DIAGNOSIS — M1611 Unilateral primary osteoarthritis, right hip: Secondary | ICD-10-CM | POA: Diagnosis not present

## 2018-04-03 ENCOUNTER — Ambulatory Visit (INDEPENDENT_AMBULATORY_CARE_PROVIDER_SITE_OTHER): Payer: Medicare HMO | Admitting: Podiatry

## 2018-04-03 DIAGNOSIS — I83009 Varicose veins of unspecified lower extremity with ulcer of unspecified site: Secondary | ICD-10-CM

## 2018-04-03 DIAGNOSIS — L97421 Non-pressure chronic ulcer of left heel and midfoot limited to breakdown of skin: Secondary | ICD-10-CM

## 2018-04-03 DIAGNOSIS — I872 Venous insufficiency (chronic) (peripheral): Secondary | ICD-10-CM | POA: Diagnosis not present

## 2018-04-03 DIAGNOSIS — E08621 Diabetes mellitus due to underlying condition with foot ulcer: Secondary | ICD-10-CM | POA: Diagnosis not present

## 2018-04-03 DIAGNOSIS — G309 Alzheimer's disease, unspecified: Secondary | ICD-10-CM | POA: Diagnosis not present

## 2018-04-03 DIAGNOSIS — L97909 Non-pressure chronic ulcer of unspecified part of unspecified lower leg with unspecified severity: Secondary | ICD-10-CM

## 2018-04-03 NOTE — Patient Instructions (Signed)
Left foot wound healed. Apply Unna boot and compressive bandage to the right leg twice weekly. Make sure both heels are offloaded with pillows at all times when in bed.

## 2018-04-04 DIAGNOSIS — G301 Alzheimer's disease with late onset: Secondary | ICD-10-CM | POA: Diagnosis not present

## 2018-04-04 DIAGNOSIS — L03115 Cellulitis of right lower limb: Secondary | ICD-10-CM | POA: Diagnosis not present

## 2018-04-04 DIAGNOSIS — L03116 Cellulitis of left lower limb: Secondary | ICD-10-CM | POA: Diagnosis not present

## 2018-04-04 DIAGNOSIS — G309 Alzheimer's disease, unspecified: Secondary | ICD-10-CM | POA: Diagnosis not present

## 2018-04-04 DIAGNOSIS — M1611 Unilateral primary osteoarthritis, right hip: Secondary | ICD-10-CM | POA: Diagnosis not present

## 2018-04-04 DIAGNOSIS — F0280 Dementia in other diseases classified elsewhere without behavioral disturbance: Secondary | ICD-10-CM | POA: Diagnosis not present

## 2018-04-04 DIAGNOSIS — E1169 Type 2 diabetes mellitus with other specified complication: Secondary | ICD-10-CM | POA: Diagnosis not present

## 2018-04-05 DIAGNOSIS — G309 Alzheimer's disease, unspecified: Secondary | ICD-10-CM | POA: Diagnosis not present

## 2018-04-06 DIAGNOSIS — E1169 Type 2 diabetes mellitus with other specified complication: Secondary | ICD-10-CM | POA: Diagnosis not present

## 2018-04-06 DIAGNOSIS — L03115 Cellulitis of right lower limb: Secondary | ICD-10-CM | POA: Diagnosis not present

## 2018-04-06 DIAGNOSIS — G301 Alzheimer's disease with late onset: Secondary | ICD-10-CM | POA: Diagnosis not present

## 2018-04-06 DIAGNOSIS — F0280 Dementia in other diseases classified elsewhere without behavioral disturbance: Secondary | ICD-10-CM | POA: Diagnosis not present

## 2018-04-06 DIAGNOSIS — G309 Alzheimer's disease, unspecified: Secondary | ICD-10-CM | POA: Diagnosis not present

## 2018-04-06 DIAGNOSIS — M1611 Unilateral primary osteoarthritis, right hip: Secondary | ICD-10-CM | POA: Diagnosis not present

## 2018-04-06 DIAGNOSIS — L03116 Cellulitis of left lower limb: Secondary | ICD-10-CM | POA: Diagnosis not present

## 2018-04-07 DIAGNOSIS — E1165 Type 2 diabetes mellitus with hyperglycemia: Secondary | ICD-10-CM | POA: Diagnosis not present

## 2018-04-07 DIAGNOSIS — G309 Alzheimer's disease, unspecified: Secondary | ICD-10-CM | POA: Diagnosis not present

## 2018-04-07 DIAGNOSIS — Z7689 Persons encountering health services in other specified circumstances: Secondary | ICD-10-CM | POA: Diagnosis not present

## 2018-04-07 DIAGNOSIS — G301 Alzheimer's disease with late onset: Secondary | ICD-10-CM | POA: Diagnosis not present

## 2018-04-07 DIAGNOSIS — E1169 Type 2 diabetes mellitus with other specified complication: Secondary | ICD-10-CM | POA: Diagnosis not present

## 2018-04-08 DIAGNOSIS — L03115 Cellulitis of right lower limb: Secondary | ICD-10-CM | POA: Diagnosis not present

## 2018-04-08 DIAGNOSIS — L03116 Cellulitis of left lower limb: Secondary | ICD-10-CM | POA: Diagnosis not present

## 2018-04-08 DIAGNOSIS — F0280 Dementia in other diseases classified elsewhere without behavioral disturbance: Secondary | ICD-10-CM | POA: Diagnosis not present

## 2018-04-08 DIAGNOSIS — G309 Alzheimer's disease, unspecified: Secondary | ICD-10-CM | POA: Diagnosis not present

## 2018-04-08 DIAGNOSIS — M1611 Unilateral primary osteoarthritis, right hip: Secondary | ICD-10-CM | POA: Diagnosis not present

## 2018-04-08 DIAGNOSIS — G301 Alzheimer's disease with late onset: Secondary | ICD-10-CM | POA: Diagnosis not present

## 2018-04-08 DIAGNOSIS — E1169 Type 2 diabetes mellitus with other specified complication: Secondary | ICD-10-CM | POA: Diagnosis not present

## 2018-04-09 DIAGNOSIS — G309 Alzheimer's disease, unspecified: Secondary | ICD-10-CM | POA: Diagnosis not present

## 2018-04-10 DIAGNOSIS — G309 Alzheimer's disease, unspecified: Secondary | ICD-10-CM | POA: Diagnosis not present

## 2018-04-11 DIAGNOSIS — F0280 Dementia in other diseases classified elsewhere without behavioral disturbance: Secondary | ICD-10-CM | POA: Diagnosis not present

## 2018-04-11 DIAGNOSIS — L03115 Cellulitis of right lower limb: Secondary | ICD-10-CM | POA: Diagnosis not present

## 2018-04-11 DIAGNOSIS — M1611 Unilateral primary osteoarthritis, right hip: Secondary | ICD-10-CM | POA: Diagnosis not present

## 2018-04-11 DIAGNOSIS — G301 Alzheimer's disease with late onset: Secondary | ICD-10-CM | POA: Diagnosis not present

## 2018-04-11 DIAGNOSIS — G309 Alzheimer's disease, unspecified: Secondary | ICD-10-CM | POA: Diagnosis not present

## 2018-04-11 DIAGNOSIS — L03116 Cellulitis of left lower limb: Secondary | ICD-10-CM | POA: Diagnosis not present

## 2018-04-11 DIAGNOSIS — E1169 Type 2 diabetes mellitus with other specified complication: Secondary | ICD-10-CM | POA: Diagnosis not present

## 2018-04-12 DIAGNOSIS — G309 Alzheimer's disease, unspecified: Secondary | ICD-10-CM | POA: Diagnosis not present

## 2018-04-13 DIAGNOSIS — G309 Alzheimer's disease, unspecified: Secondary | ICD-10-CM | POA: Diagnosis not present

## 2018-04-14 DIAGNOSIS — G301 Alzheimer's disease with late onset: Secondary | ICD-10-CM | POA: Diagnosis not present

## 2018-04-14 DIAGNOSIS — E11649 Type 2 diabetes mellitus with hypoglycemia without coma: Secondary | ICD-10-CM | POA: Diagnosis not present

## 2018-04-14 DIAGNOSIS — E1169 Type 2 diabetes mellitus with other specified complication: Secondary | ICD-10-CM | POA: Diagnosis not present

## 2018-04-14 DIAGNOSIS — G309 Alzheimer's disease, unspecified: Secondary | ICD-10-CM | POA: Diagnosis not present

## 2018-04-15 DIAGNOSIS — E1169 Type 2 diabetes mellitus with other specified complication: Secondary | ICD-10-CM | POA: Diagnosis not present

## 2018-04-15 DIAGNOSIS — M1611 Unilateral primary osteoarthritis, right hip: Secondary | ICD-10-CM | POA: Diagnosis not present

## 2018-04-15 DIAGNOSIS — G301 Alzheimer's disease with late onset: Secondary | ICD-10-CM | POA: Diagnosis not present

## 2018-04-15 DIAGNOSIS — F0280 Dementia in other diseases classified elsewhere without behavioral disturbance: Secondary | ICD-10-CM | POA: Diagnosis not present

## 2018-04-15 DIAGNOSIS — L03115 Cellulitis of right lower limb: Secondary | ICD-10-CM | POA: Diagnosis not present

## 2018-04-15 DIAGNOSIS — L03116 Cellulitis of left lower limb: Secondary | ICD-10-CM | POA: Diagnosis not present

## 2018-04-15 DIAGNOSIS — G309 Alzheimer's disease, unspecified: Secondary | ICD-10-CM | POA: Diagnosis not present

## 2018-04-16 DIAGNOSIS — G309 Alzheimer's disease, unspecified: Secondary | ICD-10-CM | POA: Diagnosis not present

## 2018-04-17 DIAGNOSIS — G309 Alzheimer's disease, unspecified: Secondary | ICD-10-CM | POA: Diagnosis not present

## 2018-04-17 NOTE — Progress Notes (Signed)
0   Subjective:  Patient ID: [name], male    DOB: May 04, 1946,  MRN: [id number]  No chief complaint on file.  72 y.o. male presents for wound care. Believes the left foot wound to have healed.  Review of Systems: Negative except as noted in the HPI. Denies N/V/F/Ch.  Past Medical History:  Diagnosis Date  . Coronary artery disease   . Diabetes mellitus    type II  . Gastritis   . Glaucoma    hx of  . Hypercholesterolemia   . Hypertension   . Insomnia   . 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  acetaminophen (TYLENOL) 325 MG tablet, Take 650 mg by mouth 2 (two) times daily., Disp: , Rfl:  .  Acetaminophen 325 MG CAPS, Take 650 mg by mouth every 4 (four) hours as needed for mild pain., Disp: , Rfl:  .  aspirin 81 MG chewable tablet, Chew 81 mg by mouth daily. , Disp: , Rfl:  .  atorvastatin (LIPITOR) 40 MG tablet, Take 40 mg by mouth at bedtime. , Disp: , Rfl:  .  carvedilol (COREG) 3.125 MG tablet, Take 3.125 mg by mouth 2 (two) times daily. , Disp: , Rfl:  .  doxepin (SINEQUAN) 10 MG capsule, Take 10 mg by mouth at bedtime. , Disp: , Rfl:  .  escitalopram (LEXAPRO) 10 MG tablet, Take 10 mg by mouth daily. , Disp: , Rfl:  .  ferrous gluconate (FERGON) 324 MG tablet, Take 324 mg by mouth daily., Disp: , Rfl:  .  furosemide (LASIX) 40 MG tablet, Take 40 mg by mouth daily. , Disp: , Rfl:  .  insulin glargine (LANTUS) 100 UNIT/ML injection, Inject 5-14 Units into the skin See admin instructions. Inject 14 units every morning and 5 units every night at bedtime., Disp: , Rfl:  .  lisinopril (PRINIVIL,ZESTRIL) 5 MG tablet, Take 5 mg by mouth daily. , Disp: , Rfl:  .  metFORMIN (GLUCOPHAGE) 1000 MG tablet, Take 1,000 mg by mouth 2 (two) times daily. , Disp: , Rfl:   Social History   Tobacco Use  Smoking Status Never Smoker  Smokeless Tobacco Never Used    No Known Allergies Objective:  There were no vitals filed for this visit. There is no  height or weight on file to calculate BMI. Constitutional Well developed. Well nourished.  Vascular Dorsalis pedis pulses palpable bilaterally. Posterior tibial pulses non-palpable bilaterally. Capillary refill normal to all digits.  No cyanosis or clubbing noted. Pedal hair growth absent.  Neurologic Normal speech. Oriented to person, place, and time. Protective sensation absent  Dermatologic Small venous stasis ulcerations present bilateral lower extremity Ulceration left sub-met 3 healed.  Orthopedic: No pain to palpation either foot. History of amputation left great toe   Radiographs: None today Assessment:   1. Diabetic ulcer of left midfoot associated with diabetes mellitus due to underlying condition, limited to breakdown of skin ([place])   2. Venous ulcer ([place])   3. Venous (peripheral) insufficiency    Plan:  Patient was evaluated and treated and all questions answered.  Venous ulcer bilateral lower extremity -R Leg Multilayer compression wrap applied.  Procedure: Multilayer Compression dressing Rationale: venous insufficiency Technique: Unna boot, cast padding, Coban compression dressing applied Disposition: Patient tolerated procedure well.   Ulceration left third metatarsal with history of hallux amputation -Healed. -Awaiting DM shoes with hallux filler but needs PCP.  Orders written for facility: Left foot  wound healed. Apply Unna boot and compressive bandage to the right leg twice weekly. Make sure both heels are offloaded with pillows at all times when in bed.   Return in about 3 weeks (around 04/24/2018) for Wound Care, Bilateral.

## 2018-04-18 DIAGNOSIS — L03116 Cellulitis of left lower limb: Secondary | ICD-10-CM | POA: Diagnosis not present

## 2018-04-18 DIAGNOSIS — M1611 Unilateral primary osteoarthritis, right hip: Secondary | ICD-10-CM | POA: Diagnosis not present

## 2018-04-18 DIAGNOSIS — G309 Alzheimer's disease, unspecified: Secondary | ICD-10-CM | POA: Diagnosis not present

## 2018-04-18 DIAGNOSIS — E1169 Type 2 diabetes mellitus with other specified complication: Secondary | ICD-10-CM | POA: Diagnosis not present

## 2018-04-18 DIAGNOSIS — F0280 Dementia in other diseases classified elsewhere without behavioral disturbance: Secondary | ICD-10-CM | POA: Diagnosis not present

## 2018-04-18 DIAGNOSIS — L03115 Cellulitis of right lower limb: Secondary | ICD-10-CM | POA: Diagnosis not present

## 2018-04-18 DIAGNOSIS — G301 Alzheimer's disease with late onset: Secondary | ICD-10-CM | POA: Diagnosis not present

## 2018-04-19 DIAGNOSIS — G309 Alzheimer's disease, unspecified: Secondary | ICD-10-CM | POA: Diagnosis not present

## 2018-04-20 DIAGNOSIS — G309 Alzheimer's disease, unspecified: Secondary | ICD-10-CM | POA: Diagnosis not present

## 2018-04-21 DIAGNOSIS — E1169 Type 2 diabetes mellitus with other specified complication: Secondary | ICD-10-CM | POA: Diagnosis not present

## 2018-04-21 DIAGNOSIS — G309 Alzheimer's disease, unspecified: Secondary | ICD-10-CM | POA: Diagnosis not present

## 2018-04-21 DIAGNOSIS — E11649 Type 2 diabetes mellitus with hypoglycemia without coma: Secondary | ICD-10-CM | POA: Diagnosis not present

## 2018-04-21 DIAGNOSIS — G301 Alzheimer's disease with late onset: Secondary | ICD-10-CM | POA: Diagnosis not present

## 2018-04-22 DIAGNOSIS — G309 Alzheimer's disease, unspecified: Secondary | ICD-10-CM | POA: Diagnosis not present

## 2018-04-23 DIAGNOSIS — G309 Alzheimer's disease, unspecified: Secondary | ICD-10-CM | POA: Diagnosis not present

## 2018-04-24 DIAGNOSIS — G309 Alzheimer's disease, unspecified: Secondary | ICD-10-CM | POA: Diagnosis not present

## 2018-04-25 ENCOUNTER — Ambulatory Visit (INDEPENDENT_AMBULATORY_CARE_PROVIDER_SITE_OTHER): Payer: Medicare HMO | Admitting: Podiatry

## 2018-04-25 DIAGNOSIS — M2042 Other hammer toe(s) (acquired), left foot: Secondary | ICD-10-CM

## 2018-04-25 DIAGNOSIS — L97421 Non-pressure chronic ulcer of left heel and midfoot limited to breakdown of skin: Secondary | ICD-10-CM

## 2018-04-25 DIAGNOSIS — M2041 Other hammer toe(s) (acquired), right foot: Secondary | ICD-10-CM

## 2018-04-25 DIAGNOSIS — E1151 Type 2 diabetes mellitus with diabetic peripheral angiopathy without gangrene: Secondary | ICD-10-CM | POA: Diagnosis not present

## 2018-04-25 DIAGNOSIS — E08621 Diabetes mellitus due to underlying condition with foot ulcer: Secondary | ICD-10-CM

## 2018-04-25 DIAGNOSIS — G309 Alzheimer's disease, unspecified: Secondary | ICD-10-CM | POA: Diagnosis not present

## 2018-04-25 NOTE — Patient Instructions (Signed)
Continue offloading shoe Apply antibiotic cream and dressing daily.

## 2018-04-26 DIAGNOSIS — G309 Alzheimer's disease, unspecified: Secondary | ICD-10-CM | POA: Diagnosis not present

## 2018-04-27 DIAGNOSIS — G309 Alzheimer's disease, unspecified: Secondary | ICD-10-CM | POA: Diagnosis not present

## 2018-04-28 DIAGNOSIS — H401133 Primary open-angle glaucoma, bilateral, severe stage: Secondary | ICD-10-CM | POA: Diagnosis not present

## 2018-04-28 DIAGNOSIS — Z961 Presence of intraocular lens: Secondary | ICD-10-CM | POA: Insufficient documentation

## 2018-04-28 DIAGNOSIS — G309 Alzheimer's disease, unspecified: Secondary | ICD-10-CM | POA: Diagnosis not present

## 2018-04-29 DIAGNOSIS — G309 Alzheimer's disease, unspecified: Secondary | ICD-10-CM | POA: Diagnosis not present

## 2018-04-30 DIAGNOSIS — G309 Alzheimer's disease, unspecified: Secondary | ICD-10-CM | POA: Diagnosis not present

## 2018-05-01 DIAGNOSIS — G309 Alzheimer's disease, unspecified: Secondary | ICD-10-CM | POA: Diagnosis not present

## 2018-05-02 DIAGNOSIS — G309 Alzheimer's disease, unspecified: Secondary | ICD-10-CM | POA: Diagnosis not present

## 2018-05-03 DIAGNOSIS — G309 Alzheimer's disease, unspecified: Secondary | ICD-10-CM | POA: Diagnosis not present

## 2018-05-03 DIAGNOSIS — R262 Difficulty in walking, not elsewhere classified: Secondary | ICD-10-CM | POA: Diagnosis not present

## 2018-05-03 DIAGNOSIS — M6281 Muscle weakness (generalized): Secondary | ICD-10-CM | POA: Diagnosis not present

## 2018-05-04 DIAGNOSIS — G309 Alzheimer's disease, unspecified: Secondary | ICD-10-CM | POA: Diagnosis not present

## 2018-05-04 NOTE — Progress Notes (Signed)
Patient ID: [name], male   DOB: 07-Jan-1946, 72 y.o.   MRN: [id number]           Reason for Appointment: Consultation for Type 2 Diabetes  Referring physician: Sande Brothers   History of Present Illness:          Date of diagnosis of type 2 diabetes mellitus:?  1990s       Background history:  Patient is a poor historian He thinks he may have been on oral medications before going on insulin for unknown duration of time No prior records and no prior A1c is available  Recent history:   Most recent A1c is 6.3 done on 05/05/2018  INSULIN regimen is:   Lantus 14 units in the morning and 5 in the evening     Non-insulin hypoglycemic drugs the patient is taking are: Metformin 1 g twice daily  Current management, blood sugar patterns and problems identified:   He had apparently been on NovoLog and Lantus previously but at some point his NovoLog was stopped  He apparently had a hypoglycemic episode in 10/19 with blood sugar down to 36 His blood sugar apparently got low early morning that day and he was seen in the ER at about 8 AM after his morning insulin  However does not appear that his blood sugar has been low subsequently  Also no insulin changes have been made since then  Currently his blood sugars are being monitored fasting and bedtime at the nursing home  Although he has had blood sugars are high as 215 after dinner more recently his blood sugars have been as low as 108 at bedtime  Reportedly has no side effects with metformin  He says that he is eating his meals fairly consistently        Side effects from medications have been:      Meal times are:: Dinner 4:30 PM  Typical meal intake: Patient does not remember                   Glucose monitoring:  done  times a day         Glucometer:  Unknown.       Blood Glucose readings from home/nursing home records:  PREMEAL Breakfast Lunch Dinner Bedtime  Overall   Glucose range:  80-128    108-219   Median:           Dietician visit, most recent: Never  Weight history:  Wt Readings from Last 3 Encounters:  05/05/18 161 lb 12.8 oz (73.4 kg)  03/31/18 178 lb (80.7 kg)    Glycemic control:   Lab Results  Component Value Date   HGBA1C 6.3 (A) 05/05/2018   Lab Results  Component Value Date   LDLCALC 120 (H) 04/30/2007   CREATININE 1.09 03/31/2018   No results found for: MICRALBCREAT  No results found for: FRUCTOSAMINE  Office Visit on 05/05/2018  Component Date Value Ref Range Status  . Hemoglobin A1C 05/05/2018 6.3* 4.0 - 5.6 % Final    Allergies as of 05/05/2018   No Known Allergies     Medication List        Accurate as of 05/05/18 12:19 PM. Always use your most recent med list.          Acetaminophen 325 MG Caps Take 650 mg by mouth every 4 (four) hours as needed for mild pain.   acetaminophen 325 MG tablet Commonly known as:  TYLENOL Take 650 mg by mouth  2 (two) times daily.   aspirin 81 MG chewable tablet Chew 81 mg by mouth daily.   atorvastatin 40 MG tablet Commonly known as:  LIPITOR Take 40 mg by mouth at bedtime.   carvedilol 3.125 MG tablet Commonly known as:  COREG Take 3.125 mg by mouth 2 (two) times daily.   doxepin 10 MG capsule Commonly known as:  SINEQUAN Take 10 mg by mouth at bedtime.   escitalopram 10 MG tablet Commonly known as:  LEXAPRO Take 10 mg by mouth daily.   ferrous gluconate 324 MG tablet Commonly known as:  FERGON Take 324 mg by mouth daily.   furosemide 40 MG tablet Commonly known as:  LASIX Take 40 mg by mouth daily.   LANTUS 100 UNIT/ML injection Generic drug:  insulin glargine Inject 5-19 Units into the skin See admin instructions. Inject 14 units every morning and 5 units every night at bedtime.   lisinopril 5 MG tablet Commonly known as:  PRINIVIL,ZESTRIL Take 5 mg by mouth daily.   metFORMIN 1000 MG tablet Commonly known as:  GLUCOPHAGE Take 1,000 mg by mouth 2 (two) times daily.       Allergies: No  Known Allergies  Past Medical History:  Diagnosis Date  . Coronary artery disease   . Diabetes mellitus    type II  . Gastritis   . Glaucoma    hx of  . Hypercholesterolemia   . Hypertension   . Insomnia   . Stroke [facility])    hx of multiple    Past Surgical History:  Procedure Laterality Date  . CATARACT EXTRACTION    . CORONARY ARTERY BYPASS GRAFT  2006   LIMA to LAD; SVG gto diagonal; SVG to ramus/L [facility]SA)    Family History  Problem Relation Age of Onset  . Diabetes Father 58  . Other Mother 57       alive    Social History:  reports that he has never smoked. He has never used smokeless tobacco. He reports that he does not drink alcohol or use drugs.   Review of Systems  Constitutional: Negative for reduced appetite.  HENT: Negative for trouble swallowing.   Eyes: Negative for blurred vision.       Followed by eye doctor for glaucoma  Cardiovascular: Positive for leg swelling. Negative for chest pain.  Gastrointestinal: Negative for diarrhea.  Endocrine: Negative for fatigue and polydipsia.  Genitourinary: Negative for dysuria.  Musculoskeletal: Negative for joint pain.  Skin: Negative for rash.  Neurological: Positive for numbness.  Psychiatric/Behavioral: Negative for insomnia.     Lipid history: Has been on Lipitor for some time, currently taking 40 mg's    Lab Results  Component Value Date   CHOL 178 04/30/2007   HDL 45.2 04/30/2007   LDLCALC 120 (H) 04/30/2007   TRIG 65 04/30/2007   CHOLHDL 3.9 CALC 04/30/2007           Hypertension: Has been mild currently only on low-dose carvedilol and 5 mg lisinopril  BP Readings from Last 3 Encounters:  05/05/18 128/72  03/31/18 (!) 170/85    Most recent eye exam was unknown  Most recent foot exam: 11/19  Currently known complications of diabetes: Peripheral neuropathy, left great toe amputation, foot ulcers  He reportedly has venous ulcers of his feet followed by podiatrist regularly Diabetic shoes  have been recommended  LABS:  Office Visit on 05/05/2018  Component Date Value Ref Range Status  . Hemoglobin A1C 05/05/2018 6.3* 4.0 - 5.6 %  Final    Physical Examination:  BP 128/72 (BP Location: Left Arm, Patient Position: Sitting, Cuff Size: Normal)   Pulse 68   Ht 5\' 11"  (1.803 m)   Wt 161 lb 12.8 oz (73.4 kg)   SpO2 94%   BMI 22.57 kg/m   GENERAL:    He is rapidly built and nourished  HEENT:         Eye exam shows normal external appearance.  Fundus exam shows no retinopathy.  Pigmented scars on the right fundus present, no obvious hemorrhages  Oral exam shows normal mucosa .   NECK:   There is no lymphadenopathy  Thyroid is not enlarged and no nodules felt.   Carotids are normal to palpation and no bruit heard  LUNGS:         Chest is symmetrical. Lungs are clear to auscultation. Marland Kitchen   HEART:         Heart sounds:  S1 and S2 are normal. No murmur or click heard., no S3 or S4.   ABDOMEN:   There is no distention present. Liver and spleen are not palpable.  No other mass or tenderness present.    NEUROLOGICAL:   Ankle jerks are absent bilaterally.    Diabetic Foot Exam - Simple   Simple Foot Form Diabetic Foot exam was performed with the following findings:  Yes   Visual Inspection See comments:  Yes Sensation Testing See comments:  Yes Pulse Check Posterior Tibialis and Dorsalis pulse intact bilaterally:  Yes See comments:  Yes Comments Healed midfoot plantar surface on the left.  Marked scaling of the plantar surfaces Amputation of first left great toe present with marked callosity of the distal second toe with scaling but no ulceration. Edema of the left foot present and around the ankle Onychomycosis of toenails present Mild dusky discoloration of the distal right foot present with scaling and ichthyosis of the foot on the plantar surface Absent sensation on the distal feet Decreased posterior tibialis but normal dorsalis pedis pulses             Vibration sense is absent in distal first toes.  MUSCULOSKELETAL:  There is no swelling or deformity of the peripheral joints.     EXTREMITIES:     There is left ankle edema with swelling of the left foot  SKIN:       No rash, see foot exam for abnormal findings      ASSESSMENT:  Diabetes type 2 on insulin  See history of present illness for detailed discussion of current diabetes management, blood sugar patterns and problems identified  Recent A1c of 6.3 indicates excellent control  Current treatment regimen is metformin and 2 times daily low-dose Lantus Blood sugars are being checked at bedtime and fasting Since blood sugars fasting have been near normal and as low as 88 not clear if he needs Lantus in the evening Blood sugars may be variable at bedtime and only occasionally over 093  Complications of diabetes: Peripheral neuropathy with sensory loss, unknown status of nephropathy, retinopathy  History of coronary artery disease    PLAN:    1. Glucose monitoring: . Nursing  home instructions given to check readings either fasting or 2 hours after meals with some readings after lunch and dinner  . 2.  Labs: Need to reassess renal function and also assess level of recent control with fructosamine  . 3.  Check lipid panel today for evaluation of adequacy of treatment  4.  Medication changes  needed: . We will leave off his Lantus in the evening for now . May consider increasing Lantus in the morning if sugars are higher in the afternoon consistently . If his blood sugars during the day are significantly high postprandially may consider adding Invokana or Jardiance to help with control and cardiac benefits long-term with his history of CAD  5.  Preventive care needed: Urine microalbumin . Needs reports of eye exam  6.  Follow-up: 1 month    Patient Instructions  BLOOD sugar TESTING INSTRUCTIONS:  Check blood sugar on waking up every 2 days instead of daily   Also  have blood sugar checked at 2 PM on 1 day and 7 PM the next  Fax blood sugar readings to [phone] at the end of the month  STOP LANTUS insulin in the evening      Consultation note has been sent to the referring physician  [name] 05/05/2018, 12:19 PM   Note: This office note was prepared with Dragon voice recognition system technology. Any transcriptional errors that result from this process are unintentional.

## 2018-05-05 ENCOUNTER — Encounter: Payer: Self-pay | Admitting: Endocrinology

## 2018-05-05 ENCOUNTER — Ambulatory Visit (INDEPENDENT_AMBULATORY_CARE_PROVIDER_SITE_OTHER): Payer: Medicare HMO | Admitting: Endocrinology

## 2018-05-05 VITALS — BP 128/72 | HR 68 | Ht 71.0 in | Wt 161.8 lb

## 2018-05-05 DIAGNOSIS — E1165 Type 2 diabetes mellitus with hyperglycemia: Secondary | ICD-10-CM | POA: Diagnosis not present

## 2018-05-05 DIAGNOSIS — Z794 Long term (current) use of insulin: Secondary | ICD-10-CM | POA: Diagnosis not present

## 2018-05-05 DIAGNOSIS — G309 Alzheimer's disease, unspecified: Secondary | ICD-10-CM | POA: Diagnosis not present

## 2018-05-05 DIAGNOSIS — E1142 Type 2 diabetes mellitus with diabetic polyneuropathy: Secondary | ICD-10-CM | POA: Diagnosis not present

## 2018-05-05 LAB — URINALYSIS, ROUTINE W REFLEX MICROSCOPIC
Bilirubin Urine: NEGATIVE
HGB URINE DIPSTICK: NEGATIVE
Ketones, ur: NEGATIVE
Leukocytes, UA: NEGATIVE
NITRITE: NEGATIVE
TOTAL PROTEIN, URINE-UPE24: 30 — AB
Urine Glucose: NEGATIVE
Urobilinogen, UA: 0.2 (ref 0.0–1.0)
pH: 6 (ref 5.0–8.0)

## 2018-05-05 LAB — MICROALBUMIN / CREATININE URINE RATIO
Creatinine,U: 51.6 mg/dL
MICROALB/CREAT RATIO: 31.4 mg/g — AB (ref 0.0–30.0)
Microalb, Ur: 16.2 mg/dL — ABNORMAL HIGH (ref 0.0–1.9)

## 2018-05-05 LAB — COMPREHENSIVE METABOLIC PANEL
ALBUMIN: 3.9 g/dL (ref 3.5–5.2)
ALT: 22 U/L (ref 0–53)
AST: 25 U/L (ref 0–37)
Alkaline Phosphatase: 183 U/L — ABNORMAL HIGH (ref 39–117)
BUN: 27 mg/dL — ABNORMAL HIGH (ref 6–23)
CHLORIDE: 100 meq/L (ref 96–112)
CO2: 28 meq/L (ref 19–32)
CREATININE: 1.26 mg/dL (ref 0.40–1.50)
Calcium: 9.6 mg/dL (ref 8.4–10.5)
GFR: 59.87 mL/min — ABNORMAL LOW (ref >60.00)
GLUCOSE: 62 mg/dL — AB (ref 70–99)
Potassium: 5 mEq/L (ref 3.5–5.1)
SODIUM: 137 meq/L (ref 135–145)
Total Bilirubin: 0.5 mg/dL (ref 0.2–1.2)
Total Protein: 8 g/dL (ref 6.0–8.3)

## 2018-05-05 LAB — LIPID PANEL
CHOL/HDL RATIO: 3
Cholesterol: 97 mg/dL (ref 0–200)
HDL: 32.3 mg/dL — ABNORMAL LOW (ref >39.00)
LDL Cholesterol: 47 mg/dL (ref 0–99)
NONHDL: 65.17
Triglycerides: 90 mg/dL (ref 0.0–149.0)
VLDL: 18 mg/dL (ref 0.0–40.0)

## 2018-05-05 LAB — POCT GLYCOSYLATED HEMOGLOBIN (HGB A1C): HEMOGLOBIN A1C: 6.3 % — AB (ref 4.0–5.6)

## 2018-05-05 NOTE — Patient Instructions (Signed)
BLOOD sugar TESTING INSTRUCTIONS:  Check blood sugar on waking up every 2 days instead of daily   Also have blood sugar checked at 2 PM on 1 day and 7 PM the next  Fax blood sugar readings to [phone] at the end of the month  STOP LANTUS insulin in the evening

## 2018-05-06 DIAGNOSIS — G309 Alzheimer's disease, unspecified: Secondary | ICD-10-CM | POA: Diagnosis not present

## 2018-05-06 LAB — FRUCTOSAMINE: Fructosamine: 299 umol/L — ABNORMAL HIGH (ref 0–285)

## 2018-05-07 DIAGNOSIS — R262 Difficulty in walking, not elsewhere classified: Secondary | ICD-10-CM | POA: Diagnosis not present

## 2018-05-07 DIAGNOSIS — M6281 Muscle weakness (generalized): Secondary | ICD-10-CM | POA: Diagnosis not present

## 2018-05-07 DIAGNOSIS — G309 Alzheimer's disease, unspecified: Secondary | ICD-10-CM | POA: Diagnosis not present

## 2018-05-08 DIAGNOSIS — G309 Alzheimer's disease, unspecified: Secondary | ICD-10-CM | POA: Diagnosis not present

## 2018-05-09 ENCOUNTER — Ambulatory Visit (INDEPENDENT_AMBULATORY_CARE_PROVIDER_SITE_OTHER): Payer: Medicare HMO | Admitting: Podiatry

## 2018-05-09 DIAGNOSIS — R262 Difficulty in walking, not elsewhere classified: Secondary | ICD-10-CM | POA: Diagnosis not present

## 2018-05-09 DIAGNOSIS — L97921 Non-pressure chronic ulcer of unspecified part of left lower leg limited to breakdown of skin: Secondary | ICD-10-CM | POA: Diagnosis not present

## 2018-05-09 DIAGNOSIS — L97421 Non-pressure chronic ulcer of left heel and midfoot limited to breakdown of skin: Secondary | ICD-10-CM | POA: Diagnosis not present

## 2018-05-09 DIAGNOSIS — S98112A Complete traumatic amputation of left great toe, initial encounter: Secondary | ICD-10-CM

## 2018-05-09 DIAGNOSIS — E1151 Type 2 diabetes mellitus with diabetic peripheral angiopathy without gangrene: Secondary | ICD-10-CM

## 2018-05-09 DIAGNOSIS — M2041 Other hammer toe(s) (acquired), right foot: Secondary | ICD-10-CM

## 2018-05-09 DIAGNOSIS — M2042 Other hammer toe(s) (acquired), left foot: Secondary | ICD-10-CM

## 2018-05-09 DIAGNOSIS — G309 Alzheimer's disease, unspecified: Secondary | ICD-10-CM | POA: Diagnosis not present

## 2018-05-09 DIAGNOSIS — M6281 Muscle weakness (generalized): Secondary | ICD-10-CM | POA: Diagnosis not present

## 2018-05-09 DIAGNOSIS — E08621 Diabetes mellitus due to underlying condition with foot ulcer: Secondary | ICD-10-CM | POA: Diagnosis not present

## 2018-05-09 NOTE — Progress Notes (Signed)
0   Subjective:  Patient ID: [name], male    DOB: 1945/12/22,  MRN: [id number]  Chief Complaint  Patient presents with  . Foot Ulcer    left mid foot - looks much better today   72 y.o. male presents for wound care. Believes the left foot wound to have healed.  Review of Systems: Negative except as noted in the HPI. Denies N/V/F/Ch.  Past Medical History:  Diagnosis Date  . Coronary artery disease   . Diabetes mellitus    type II  . Gastritis   . Glaucoma    hx of  . Hypercholesterolemia   . Hypertension   . Insomnia   . Stroke ([place])    hx of multiple    Current Outpatient Medications:  .  acetaminophen (TYLENOL) 325 MG tablet, Take 650 mg by mouth 2 (two) times daily., Disp: , Rfl:  .  Acetaminophen 325 MG CAPS, Take 650 mg by mouth every 4 (four) hours as needed for mild pain., Disp: , Rfl:  .  aspirin 81 MG chewable tablet, Chew 81 mg by mouth daily. , Disp: , Rfl:  .  atorvastatin (LIPITOR) 40 MG tablet, Take 40 mg by mouth at bedtime. , Disp: , Rfl:  .  carvedilol (COREG) 3.125 MG tablet, Take 3.125 mg by mouth 2 (two) times daily. , Disp: , Rfl:  .  doxepin (SINEQUAN) 10 MG capsule, Take 10 mg by mouth at bedtime. , Disp: , Rfl:  .  escitalopram (LEXAPRO) 10 MG tablet, Take 10 mg by mouth daily. , Disp: , Rfl:  .  ferrous gluconate (FERGON) 324 MG tablet, Take 324 mg by mouth daily., Disp: , Rfl:  .  furosemide (LASIX) 40 MG tablet, Take 40 mg by mouth daily. , Disp: , Rfl:  .  insulin glargine (LANTUS) 100 UNIT/ML injection, Inject 5-19 Units into the skin See admin instructions. Inject 14 units every morning and 5 units every night at bedtime., Disp: , Rfl:  .  lisinopril (PRINIVIL,ZESTRIL) 5 MG tablet, Take 5 mg by mouth daily. , Disp: , Rfl:  .  metFORMIN (GLUCOPHAGE) 1000 MG tablet, Take 1,000 mg by mouth 2 (two) times daily. , Disp: , Rfl:   Social History   Tobacco Use  Smoking Status Never Smoker  Smokeless Tobacco Never Used    No Known  Allergies Objective:  There were no vitals filed for this visit. There is no height or weight on file to calculate BMI. Constitutional Well developed. Well nourished.  Vascular Dorsalis pedis pulses palpable bilaterally. Posterior tibial pulses non-palpable bilaterally. Capillary refill normal to all digits.  No cyanosis or clubbing noted. Pedal hair growth absent.  Neurologic Normal speech. Oriented to person, place, and time. Protective sensation absent  Dermatologic Small venous stasis ulcerations present bilateral lower extremity Ulceration left sub-met 3 healed. L leg ulcer measuring 1.5x1 post-debride,emt  Orthopedic: No pain to palpation either foot. History of amputation left great toe   Radiographs: None today Assessment:   1. Non-healing ulcer of lower leg, left, limited to breakdown of skin ([place])   2. Diabetic ulcer of left midfoot associated with diabetes mellitus due to underlying condition, limited to breakdown of skin ([facility])   3. Hammertoes of both feet   4. Diabetes mellitus type 2 with peripheral artery disease ([place])   5. Amputated great toe of left foot ([place])    Plan:  Patient was evaluated and treated and all questions answered.  Venous insuff bilateral lower extremity -Tubigrip  applied bilat.  Ulceration left third metatarsal with history of hallux amputation -Healed. -Awaiting DM shoes with hallux filler, will make appt for fabrication  L Leg ulcer -Debrided as below.  Procedure: Selective Debridement of Wound Rationale: Removal of devitalized tissue from the wound to promote healing.  Pre-Debridement Wound Measurements: 1.5 cm x 1. cm x 0.1 cm  Post-Debridement Wound Measurements: same as pre-debridement. Type of Debridement: sharp selective Tissue Removed: Devitalized soft-tissue Dressing: Dry, sterile, compression dressing. Disposition: Patient tolerated procedure well. Patient to return in 1 week for follow-up.   No follow-ups on file.

## 2018-05-10 DIAGNOSIS — G309 Alzheimer's disease, unspecified: Secondary | ICD-10-CM | POA: Diagnosis not present

## 2018-05-11 DIAGNOSIS — G309 Alzheimer's disease, unspecified: Secondary | ICD-10-CM | POA: Diagnosis not present

## 2018-05-12 DIAGNOSIS — G309 Alzheimer's disease, unspecified: Secondary | ICD-10-CM | POA: Diagnosis not present

## 2018-05-12 DIAGNOSIS — R262 Difficulty in walking, not elsewhere classified: Secondary | ICD-10-CM | POA: Diagnosis not present

## 2018-05-12 DIAGNOSIS — M6281 Muscle weakness (generalized): Secondary | ICD-10-CM | POA: Diagnosis not present

## 2018-05-13 DIAGNOSIS — G309 Alzheimer's disease, unspecified: Secondary | ICD-10-CM | POA: Diagnosis not present

## 2018-05-13 DIAGNOSIS — R262 Difficulty in walking, not elsewhere classified: Secondary | ICD-10-CM | POA: Diagnosis not present

## 2018-05-13 DIAGNOSIS — M6281 Muscle weakness (generalized): Secondary | ICD-10-CM | POA: Diagnosis not present

## 2018-05-14 DIAGNOSIS — R262 Difficulty in walking, not elsewhere classified: Secondary | ICD-10-CM | POA: Diagnosis not present

## 2018-05-14 DIAGNOSIS — G309 Alzheimer's disease, unspecified: Secondary | ICD-10-CM | POA: Diagnosis not present

## 2018-05-14 DIAGNOSIS — M6281 Muscle weakness (generalized): Secondary | ICD-10-CM | POA: Diagnosis not present

## 2018-05-15 DIAGNOSIS — G309 Alzheimer's disease, unspecified: Secondary | ICD-10-CM | POA: Diagnosis not present

## 2018-05-16 DIAGNOSIS — G309 Alzheimer's disease, unspecified: Secondary | ICD-10-CM | POA: Diagnosis not present

## 2018-05-17 DIAGNOSIS — G309 Alzheimer's disease, unspecified: Secondary | ICD-10-CM | POA: Diagnosis not present

## 2018-05-17 NOTE — Progress Notes (Signed)
0   Subjective:  Patient ID: [name], male    DOB: Aug 16, 1945,  MRN: [id number]  Chief Complaint  Patient presents with  . Foot Ulcer    2 week follow up   72 y.o. male presents for wound care. Doing well denies issues. Thinks the wound is doing ok.  Review of Systems: Negative except as noted in the HPI. Denies N/V/F/Ch.  Past Medical History:  Diagnosis Date  . Coronary artery disease   . Diabetes mellitus    type II  . Gastritis   . Glaucoma    hx of  . Hypercholesterolemia   . Hypertension   . Insomnia   . Stroke ([place])    hx of multiple    Current Outpatient Medications:  .  acetaminophen (TYLENOL) 325 MG tablet, Take 650 mg by mouth 2 (two) times daily., Disp: , Rfl:  .  Acetaminophen 325 MG CAPS, Take 650 mg by mouth every 4 (four) hours as needed for mild pain., Disp: , Rfl:  .  aspirin 81 MG chewable tablet, Chew 81 mg by mouth daily. , Disp: , Rfl:  .  atorvastatin (LIPITOR) 40 MG tablet, Take 40 mg by mouth at bedtime. , Disp: , Rfl:  .  carvedilol (COREG) 3.125 MG tablet, Take 3.125 mg by mouth 2 (two) times daily. , Disp: , Rfl:  .  doxepin (SINEQUAN) 10 MG capsule, Take 10 mg by mouth at bedtime. , Disp: , Rfl:  .  escitalopram (LEXAPRO) 10 MG tablet, Take 10 mg by mouth daily. , Disp: , Rfl:  .  ferrous gluconate (FERGON) 324 MG tablet, Take 324 mg by mouth daily., Disp: , Rfl:  .  furosemide (LASIX) 40 MG tablet, Take 40 mg by mouth daily. , Disp: , Rfl:  .  insulin glargine (LANTUS) 100 UNIT/ML injection, Inject 5-19 Units into the skin See admin instructions. Inject 14 units every morning and 5 units every night at bedtime., Disp: , Rfl:  .  lisinopril (PRINIVIL,ZESTRIL) 5 MG tablet, Take 5 mg by mouth daily. , Disp: , Rfl:  .  metFORMIN (GLUCOPHAGE) 1000 MG tablet, Take 1,000 mg by mouth 2 (two) times daily. , Disp: , Rfl:   Social History   Tobacco Use  Smoking Status Never Smoker  Smokeless Tobacco Never Used    No Known Allergies Objective:    There were no vitals filed for this visit. There is no height or weight on file to calculate BMI. Constitutional Well developed. Well nourished.  Vascular Dorsalis pedis pulses palpable bilaterally. Posterior tibial pulses non-palpable bilaterally. Capillary refill normal to all digits.  No cyanosis or clubbing noted. Pedal hair growth absent.  Neurologic Normal speech. Oriented to person, place, and time. Protective sensation absent  Dermatologic Small venous stasis ulcerations present bilateral lower extremity Ulceration left sub-met 3 healed.  Orthopedic: No pain to palpation either foot. History of amputation left great toe   Radiographs: None today Assessment:   1. Diabetic ulcer of left midfoot associated with diabetes mellitus due to underlying condition, limited to breakdown of skin ([place])   2. Hammertoes of both feet   3. Diabetes mellitus type 2 with peripheral artery disease ([place])    Plan:  Patient was evaluated and treated and all questions answered.  Ulceration left third metatarsal with history of hallux amputation -Small opening noted today. -Awaiting DM shoes with hallux filler but needs PCP.  No follow-ups on file.

## 2018-05-18 DIAGNOSIS — G309 Alzheimer's disease, unspecified: Secondary | ICD-10-CM | POA: Diagnosis not present

## 2018-05-19 DIAGNOSIS — R262 Difficulty in walking, not elsewhere classified: Secondary | ICD-10-CM | POA: Diagnosis not present

## 2018-05-19 DIAGNOSIS — M6281 Muscle weakness (generalized): Secondary | ICD-10-CM | POA: Diagnosis not present

## 2018-05-19 DIAGNOSIS — G309 Alzheimer's disease, unspecified: Secondary | ICD-10-CM | POA: Diagnosis not present

## 2018-05-20 ENCOUNTER — Ambulatory Visit: Payer: Medicare HMO | Admitting: Orthotics

## 2018-05-20 DIAGNOSIS — L97921 Non-pressure chronic ulcer of unspecified part of left lower leg limited to breakdown of skin: Secondary | ICD-10-CM

## 2018-05-20 DIAGNOSIS — M2042 Other hammer toe(s) (acquired), left foot: Secondary | ICD-10-CM

## 2018-05-20 DIAGNOSIS — E1151 Type 2 diabetes mellitus with diabetic peripheral angiopathy without gangrene: Secondary | ICD-10-CM

## 2018-05-20 DIAGNOSIS — M2041 Other hammer toe(s) (acquired), right foot: Secondary | ICD-10-CM

## 2018-05-20 NOTE — Progress Notes (Signed)
Patient cast today for l5000/2 x a5514 and 2 x a5500.  Patient presents with midfoot ucler LEFT, and hx of DM2. [id number] ordered.   Liliane Channel

## 2018-05-21 DIAGNOSIS — G309 Alzheimer's disease, unspecified: Secondary | ICD-10-CM | POA: Diagnosis not present

## 2018-05-21 DIAGNOSIS — M6281 Muscle weakness (generalized): Secondary | ICD-10-CM | POA: Diagnosis not present

## 2018-05-21 DIAGNOSIS — R262 Difficulty in walking, not elsewhere classified: Secondary | ICD-10-CM | POA: Diagnosis not present

## 2018-05-23 DIAGNOSIS — M6281 Muscle weakness (generalized): Secondary | ICD-10-CM | POA: Diagnosis not present

## 2018-05-23 DIAGNOSIS — G309 Alzheimer's disease, unspecified: Secondary | ICD-10-CM | POA: Diagnosis not present

## 2018-05-23 DIAGNOSIS — R262 Difficulty in walking, not elsewhere classified: Secondary | ICD-10-CM | POA: Diagnosis not present

## 2018-05-26 DIAGNOSIS — M6281 Muscle weakness (generalized): Secondary | ICD-10-CM | POA: Diagnosis not present

## 2018-05-26 DIAGNOSIS — G309 Alzheimer's disease, unspecified: Secondary | ICD-10-CM | POA: Diagnosis not present

## 2018-05-26 DIAGNOSIS — R262 Difficulty in walking, not elsewhere classified: Secondary | ICD-10-CM | POA: Diagnosis not present

## 2018-05-27 DIAGNOSIS — G309 Alzheimer's disease, unspecified: Secondary | ICD-10-CM | POA: Diagnosis not present

## 2018-05-28 DIAGNOSIS — R262 Difficulty in walking, not elsewhere classified: Secondary | ICD-10-CM | POA: Diagnosis not present

## 2018-05-28 DIAGNOSIS — G309 Alzheimer's disease, unspecified: Secondary | ICD-10-CM | POA: Diagnosis not present

## 2018-05-28 DIAGNOSIS — M6281 Muscle weakness (generalized): Secondary | ICD-10-CM | POA: Diagnosis not present

## 2018-05-29 DIAGNOSIS — G309 Alzheimer's disease, unspecified: Secondary | ICD-10-CM | POA: Diagnosis not present

## 2018-05-30 DIAGNOSIS — M6281 Muscle weakness (generalized): Secondary | ICD-10-CM | POA: Diagnosis not present

## 2018-05-30 DIAGNOSIS — R262 Difficulty in walking, not elsewhere classified: Secondary | ICD-10-CM | POA: Diagnosis not present

## 2018-05-30 DIAGNOSIS — G309 Alzheimer's disease, unspecified: Secondary | ICD-10-CM | POA: Diagnosis not present

## 2018-05-31 DIAGNOSIS — G309 Alzheimer's disease, unspecified: Secondary | ICD-10-CM | POA: Diagnosis not present

## 2018-06-01 DIAGNOSIS — G309 Alzheimer's disease, unspecified: Secondary | ICD-10-CM | POA: Diagnosis not present

## 2018-06-02 DIAGNOSIS — R262 Difficulty in walking, not elsewhere classified: Secondary | ICD-10-CM | POA: Diagnosis not present

## 2018-06-02 DIAGNOSIS — M6281 Muscle weakness (generalized): Secondary | ICD-10-CM | POA: Diagnosis not present

## 2018-06-02 DIAGNOSIS — G309 Alzheimer's disease, unspecified: Secondary | ICD-10-CM | POA: Diagnosis not present

## 2018-06-03 DIAGNOSIS — G309 Alzheimer's disease, unspecified: Secondary | ICD-10-CM | POA: Diagnosis not present

## 2018-06-04 DIAGNOSIS — M6281 Muscle weakness (generalized): Secondary | ICD-10-CM | POA: Diagnosis not present

## 2018-06-04 DIAGNOSIS — G309 Alzheimer's disease, unspecified: Secondary | ICD-10-CM | POA: Diagnosis not present

## 2018-06-04 DIAGNOSIS — R262 Difficulty in walking, not elsewhere classified: Secondary | ICD-10-CM | POA: Diagnosis not present

## 2018-06-05 ENCOUNTER — Ambulatory Visit (INDEPENDENT_AMBULATORY_CARE_PROVIDER_SITE_OTHER): Payer: Medicare HMO | Admitting: Endocrinology

## 2018-06-05 ENCOUNTER — Encounter: Payer: Self-pay | Admitting: Endocrinology

## 2018-06-05 VITALS — BP 110/50 | HR 60 | Ht 71.0 in | Wt 167.8 lb

## 2018-06-05 DIAGNOSIS — Z794 Long term (current) use of insulin: Secondary | ICD-10-CM

## 2018-06-05 DIAGNOSIS — E1165 Type 2 diabetes mellitus with hyperglycemia: Secondary | ICD-10-CM | POA: Diagnosis not present

## 2018-06-05 DIAGNOSIS — R262 Difficulty in walking, not elsewhere classified: Secondary | ICD-10-CM | POA: Diagnosis not present

## 2018-06-05 DIAGNOSIS — M6281 Muscle weakness (generalized): Secondary | ICD-10-CM | POA: Diagnosis not present

## 2018-06-05 DIAGNOSIS — G309 Alzheimer's disease, unspecified: Secondary | ICD-10-CM | POA: Diagnosis not present

## 2018-06-05 NOTE — Progress Notes (Signed)
Patient ID: [name], male   DOB: [birth year], 72 y.o.   MRN: [id number]           Reason for Appointment: for Type 2 Diabetes  Referring physician: Sande Brothers   History of Present Illness:          Date of diagnosis of type 2 diabetes mellitus:?  1990s       Background history:  Patient is a poor historian He thinks he may have been on oral medications before going on insulin for unknown duration of time No prior records and no prior A1c is available  Recent history:   Most recent A1c is 6.3 done on 05/05/2018  INSULIN regimen is:   Lantus 14 units in the morning    Non-insulin hypoglycemic drugs the patient is taking are: Metformin 1 g twice daily  Current management, blood sugar patterns and problems identified:   He had been on Lantus twice a day on his initial consultation in November  Is taking only 14 units in the morning as prescribed  His blood sugars have been excellent although unclear what monitor he is using at the nursing home and readings are not started by time of the day  Since about 10 days ago he has had some readings in the low 70s in the morning and lowest reading was 55 done on 12/13  He does not give a good history but does not think he has symptoms of low sugars  POSTPRANDIAL readings at night have been excellent usually except for a reading of 204 done 2 nights ago after dinner and last night was 221  His attendant indicates that he may have some family members bringing sweets recently because his blood sugars have not been more than about 150-160 previously  Not able to do much physical activity, using a walker        Side effects from medications have been: None    Meal times are:: Dinner 4:30 PM  Typical meal intake: Patient does not remember                   Glucose monitoring:  done 2 times a day         Glucometer:  Unknown.       Blood Glucose readings from home/nursing home records:  PREMEAL Breakfast Lunch Dinner Bedtime   Overall   Glucose range:  55-107    108-221   Median:         Dietician visit, most recent: Never  Weight history:  Wt Readings from Last 3 Encounters:  06/05/18 167 lb 12.8 oz (76.1 kg)  05/05/18 161 lb 12.8 oz (73.4 kg)  03/31/18 178 lb (80.7 kg)    Glycemic control:   Lab Results  Component Value Date   HGBA1C 6.3 (A) 05/05/2018   Lab Results  Component Value Date   MICROALBUR 16.2 (H) 05/05/2018   LDLCALC 47 05/05/2018   CREATININE 1.26 05/05/2018   Lab Results  Component Value Date   MICRALBCREAT 31.4 (H) 05/05/2018    Lab Results  Component Value Date   FRUCTOSAMINE 299 (H) 05/05/2018    No visits with results within 1 Week(s) from this visit.  Latest known visit with results is:  Office Visit on 05/05/2018  Component Date Value Ref Range Status  . Hemoglobin A1C 05/05/2018 6.3* 4.0 - 5.6 % Final  . Fructosamine 05/05/2018 299* 0 - 285 umol/L Final   Comment: Published reference interval for apparently healthy subjects between  age 48 and 54 is 59 - 83 umol/L and in a poorly controlled diabetic population is 228 - 563 umol/L with a mean of 396 umol/L.   Marland Kitchen Cholesterol 05/05/2018 97  0 - 200 mg/dL Final   ATP III Classification       Desirable:  < 200 mg/dL               Borderline High:  200 - 239 mg/dL          High:  > = 240 mg/dL  . Triglycerides 05/05/2018 90.0  0.0 - 149.0 mg/dL Final   Normal:  <150 mg/dLBorderline High:  150 - 199 mg/dL  . HDL 05/05/2018 32.30* >39.00 mg/dL Final  . VLDL 05/05/2018 18.0  0.0 - 40.0 mg/dL Final  . LDL Cholesterol 05/05/2018 47  0 - 99 mg/dL Final  . Total CHOL/HDL Ratio 05/05/2018 3   Final                  Men          Women1/2 Average Risk     3.4          3.3Average Risk          5.0          4.42X Average Risk          9.6          7.13X Average Risk          15.0          11.0                      . NonHDL 05/05/2018 65.17   Final   NOTE:  Non-HDL goal should be 30 mg/dL higher than patient's LDL goal (i.e.  LDL goal of < 70 mg/dL, would have non-HDL goal of < 100 mg/dL)  . Sodium 05/05/2018 137  135 - 145 mEq/L Final  . Potassium 05/05/2018 5.0  3.5 - 5.1 mEq/L Final  . Chloride 05/05/2018 100  96 - 112 mEq/L Final  . CO2 05/05/2018 28  19 - 32 mEq/L Final  . Glucose, Bld 05/05/2018 62* 70 - 99 mg/dL Final  . BUN 05/05/2018 27* 6 - 23 mg/dL Final  . Creatinine, Ser 05/05/2018 1.26  0.40 - 1.50 mg/dL Final  . Total Bilirubin 05/05/2018 0.5  0.2 - 1.2 mg/dL Final  . Alkaline Phosphatase 05/05/2018 183* 39 - 117 U/L Final  . AST 05/05/2018 25  0 - 37 U/L Final  . ALT 05/05/2018 22  0 - 53 U/L Final  . Total Protein 05/05/2018 8.0  6.0 - 8.3 g/dL Final  . Albumin 05/05/2018 3.9  3.5 - 5.2 g/dL Final  . Calcium 05/05/2018 9.6  8.4 - 10.5 mg/dL Final  . GFR 05/05/2018 59.87* >60.00 mL/min Final  . Color, Urine 05/05/2018 YELLOW  Yellow;Lt. Yellow Final  . APPearance 05/05/2018 CLEAR  Clear Final  . Specific Gravity, Urine 05/05/2018 <=1.005* 1.000 - 1.030 Final  . pH 05/05/2018 6.0  5.0 - 8.0 Final  . Total Protein, Urine 05/05/2018 30* Negative Final  . Urine Glucose 05/05/2018 NEGATIVE  Negative Final  . Ketones, ur 05/05/2018 NEGATIVE  Negative Final  . Bilirubin Urine 05/05/2018 NEGATIVE  Negative Final  . Hgb urine dipstick 05/05/2018 NEGATIVE  Negative Final  . Urobilinogen, UA 05/05/2018 0.2  0.0 - 1.0 Final  . Leukocytes, UA 05/05/2018 NEGATIVE  Negative Final  . Nitrite 05/05/2018 NEGATIVE  Negative  Final  . Microalb, Ur 05/05/2018 16.2* 0.0 - 1.9 mg/dL Final  . Creatinine,U 05/05/2018 51.6  mg/dL Final  . Microalb Creat Ratio 05/05/2018 31.4* 0.0 - 30.0 mg/g Final    Allergies as of 06/05/2018   No Known Allergies     Medication List       Accurate as of June 05, 2018  3:42 PM. Always use your most recent med list.        Acetaminophen 325 MG Caps Take 650 mg by mouth every 4 (four) hours as needed for mild pain.   acetaminophen 325 MG tablet Commonly known as:   TYLENOL Take 650 mg by mouth 2 (two) times daily.   aspirin 81 MG chewable tablet Chew 81 mg by mouth daily.   atorvastatin 40 MG tablet Commonly known as:  LIPITOR Take 40 mg by mouth at bedtime.   carvedilol 3.125 MG tablet Commonly known as:  COREG Take 3.125 mg by mouth 2 (two) times daily.   doxepin 10 MG capsule Commonly known as:  SINEQUAN Take 10 mg by mouth at bedtime.   escitalopram 10 MG tablet Commonly known as:  LEXAPRO Take 10 mg by mouth daily.   ferrous gluconate 324 MG tablet Commonly known as:  FERGON Take 324 mg by mouth daily.   furosemide 40 MG tablet Commonly known as:  LASIX Take 40 mg by mouth daily.   LANTUS 100 UNIT/ML injection Generic drug:  insulin glargine Inject 5-19 Units into the skin See admin instructions. Inject 14 units every morning and 5 units every night at bedtime.   lisinopril 5 MG tablet Commonly known as:  PRINIVIL,ZESTRIL Take 5 mg by mouth daily.   metFORMIN 1000 MG tablet Commonly known as:  GLUCOPHAGE Take 1,000 mg by mouth 2 (two) times daily.       Allergies: No Known Allergies  Past Medical History:  Diagnosis Date  . Coronary artery disease   . Diabetes mellitus    type II  . Gastritis   . Glaucoma    hx of  . Hypercholesterolemia   . Hypertension   . Insomnia   . Stroke [facility])    hx of multiple    Past Surgical History:  Procedure Laterality Date  . CATARACT EXTRACTION    . CORONARY ARTERY BYPASS GRAFT  2006   LIMA to LAD; SVG gto diagonal; SVG to ramus/L [facility])    Family History  Problem Relation Age of Onset  . Diabetes Father 75  . Other Mother 22       alive    Social History:  reports that he has never smoked. He has never used smokeless tobacco. He reports that he does not drink alcohol or use drugs.   Review of Systems   Lipid history: Has been on Lipitor for some time, currently taking 40 mg's    Lab Results  Component Value Date   CHOL 97 05/05/2018   HDL 32.30 (L)  05/05/2018   LDLCALC 47 05/05/2018   TRIG 90.0 05/05/2018   CHOLHDL 3 05/05/2018           Hypertension: Has been mild currently only on low-dose carvedilol and 5 mg lisinopril  BP Readings from Last 3 Encounters:  06/05/18 (!) 110/50  05/05/18 128/72  03/31/18 (!) 170/85    Most recent eye exam was unknown  Most recent foot exam: 11/19  Currently known complications of diabetes: Peripheral neuropathy, left great toe amputation, foot ulcers  He reportedly has venous ulcers of  his feet followed by podiatrist regularly Diabetic shoes have been recommended  LABS:  No visits with results within 1 Week(s) from this visit.  Latest known visit with results is:  Office Visit on 05/05/2018  Component Date Value Ref Range Status  . Hemoglobin A1C 05/05/2018 6.3* 4.0 - 5.6 % Final  . Fructosamine 05/05/2018 299* 0 - 285 umol/L Final   Comment: Published reference interval for apparently healthy subjects between age 18 and 58 is 49 - 285 umol/L and in a poorly controlled diabetic population is 228 - 563 umol/L with a mean of 396 umol/L.   Marland Kitchen Cholesterol 05/05/2018 97  0 - 200 mg/dL Final   ATP III Classification       Desirable:  < 200 mg/dL               Borderline High:  200 - 239 mg/dL          High:  > = 240 mg/dL  . Triglycerides 05/05/2018 90.0  0.0 - 149.0 mg/dL Final   Normal:  <150 mg/dLBorderline High:  150 - 199 mg/dL  . HDL 05/05/2018 32.30* >39.00 mg/dL Final  . VLDL 05/05/2018 18.0  0.0 - 40.0 mg/dL Final  . LDL Cholesterol 05/05/2018 47  0 - 99 mg/dL Final  . Total CHOL/HDL Ratio 05/05/2018 3   Final                  Men          Women1/2 Average Risk     3.4          3.3Average Risk          5.0          4.42X Average Risk          9.6          7.13X Average Risk          15.0          11.0                      . NonHDL 05/05/2018 65.17   Final   NOTE:  Non-HDL goal should be 30 mg/dL higher than patient's LDL goal (i.e. LDL goal of < 70 mg/dL, would have non-HDL  goal of < 100 mg/dL)  . Sodium 05/05/2018 137  135 - 145 mEq/L Final  . Potassium 05/05/2018 5.0  3.5 - 5.1 mEq/L Final  . Chloride 05/05/2018 100  96 - 112 mEq/L Final  . CO2 05/05/2018 28  19 - 32 mEq/L Final  . Glucose, Bld 05/05/2018 62* 70 - 99 mg/dL Final  . BUN 05/05/2018 27* 6 - 23 mg/dL Final  . Creatinine, Ser 05/05/2018 1.26  0.40 - 1.50 mg/dL Final  . Total Bilirubin 05/05/2018 0.5  0.2 - 1.2 mg/dL Final  . Alkaline Phosphatase 05/05/2018 183* 39 - 117 U/L Final  . AST 05/05/2018 25  0 - 37 U/L Final  . ALT 05/05/2018 22  0 - 53 U/L Final  . Total Protein 05/05/2018 8.0  6.0 - 8.3 g/dL Final  . Albumin 05/05/2018 3.9  3.5 - 5.2 g/dL Final  . Calcium 05/05/2018 9.6  8.4 - 10.5 mg/dL Final  . GFR 05/05/2018 59.87* >60.00 mL/min Final  . Color, Urine 05/05/2018 YELLOW  Yellow;Lt. Yellow Final  . APPearance 05/05/2018 CLEAR  Clear Final  . Specific Gravity, Urine 05/05/2018 <=1.005* 1.000 - 1.030 Final  . pH 05/05/2018 6.0  5.0 -  8.0 Final  . Total Protein, Urine 05/05/2018 30* Negative Final  . Urine Glucose 05/05/2018 NEGATIVE  Negative Final  . Ketones, ur 05/05/2018 NEGATIVE  Negative Final  . Bilirubin Urine 05/05/2018 NEGATIVE  Negative Final  . Hgb urine dipstick 05/05/2018 NEGATIVE  Negative Final  . Urobilinogen, UA 05/05/2018 0.2  0.0 - 1.0 Final  . Leukocytes, UA 05/05/2018 NEGATIVE  Negative Final  . Nitrite 05/05/2018 NEGATIVE  Negative Final  . Microalb, Ur 05/05/2018 16.2* 0.0 - 1.9 mg/dL Final  . Creatinine,U 05/05/2018 51.6  mg/dL Final  . Microalb Creat Ratio 05/05/2018 31.4* 0.0 - 30.0 mg/g Final    Physical Examination:  BP (!) 110/50 (BP Location: Left Arm, Patient Position: Sitting, Cuff Size: Normal)   Pulse 60   Ht 5\' 11"  (1.803 m)   Wt 167 lb 12.8 oz (76.1 kg)   SpO2 97%   BMI 23.40 kg/m       ASSESSMENT:  Diabetes type 2 on insulin  See history of present illness for detailed discussion of current diabetes management, blood sugar  patterns and problems identified  Last A1c was 6.3  He is on only 14 units of Lantus along with metformin Blood sugars are excellent at home and may be too low fasting at times Has only occasional high readings after dinner probably related to inconsistent diet  Subjectively doing well  NEUROPATHY: Followed by podiatrist and information on diabetic shoes has been completed and sent over  PLAN:    1. Glucose monitoring: . Continue glucose monitoring twice a day  . 2.  Labs: Need to reassess renal function on the next visit  3.  No change in metformin  4.  Medication changes needed: Reduce Lantus to 11 units Consider adding Jardiance or Invokana on the next visit and stopping Lantus, however cost may be a factor  5.  Preventive care needed: . Needs to seen by ophthalmologist, referral to be made  6.  Follow-up: 2 months    Patient Instructions  Instructions for nursing home  LANTUS insulin: Reduce the dose from 14 units down to 11 units in the morning once daily  Please send blood sugar meter to each visit for downloading  Referral for eye exam has been made and you will be contacted about appointments      Elayne Snare 06/05/2018, 3:42 PM   Note: This office note was prepared with Dragon voice recognition system technology. Any transcriptional errors that result from this process are unintentional.

## 2018-06-05 NOTE — Patient Instructions (Signed)
Instructions for nursing home  LANTUS insulin: Reduce the dose from 14 units down to 11 units in the morning once daily  Please send blood sugar meter to each visit for downloading  Referral for eye exam has been made and you will be contacted about appointments

## 2018-06-06 DIAGNOSIS — G309 Alzheimer's disease, unspecified: Secondary | ICD-10-CM | POA: Diagnosis not present

## 2018-06-06 DIAGNOSIS — M6281 Muscle weakness (generalized): Secondary | ICD-10-CM | POA: Diagnosis not present

## 2018-06-06 DIAGNOSIS — R262 Difficulty in walking, not elsewhere classified: Secondary | ICD-10-CM | POA: Diagnosis not present

## 2018-06-07 DIAGNOSIS — G309 Alzheimer's disease, unspecified: Secondary | ICD-10-CM | POA: Diagnosis not present

## 2018-06-08 DIAGNOSIS — R262 Difficulty in walking, not elsewhere classified: Secondary | ICD-10-CM | POA: Diagnosis not present

## 2018-06-08 DIAGNOSIS — M6281 Muscle weakness (generalized): Secondary | ICD-10-CM | POA: Diagnosis not present

## 2018-06-08 DIAGNOSIS — G309 Alzheimer's disease, unspecified: Secondary | ICD-10-CM | POA: Diagnosis not present

## 2018-06-09 DIAGNOSIS — E1169 Type 2 diabetes mellitus with other specified complication: Secondary | ICD-10-CM | POA: Diagnosis not present

## 2018-06-09 DIAGNOSIS — G309 Alzheimer's disease, unspecified: Secondary | ICD-10-CM | POA: Diagnosis not present

## 2018-06-09 DIAGNOSIS — G301 Alzheimer's disease with late onset: Secondary | ICD-10-CM | POA: Diagnosis not present

## 2018-06-09 DIAGNOSIS — E1165 Type 2 diabetes mellitus with hyperglycemia: Secondary | ICD-10-CM | POA: Diagnosis not present

## 2018-06-09 DIAGNOSIS — R262 Difficulty in walking, not elsewhere classified: Secondary | ICD-10-CM | POA: Diagnosis not present

## 2018-06-09 DIAGNOSIS — M6281 Muscle weakness (generalized): Secondary | ICD-10-CM | POA: Diagnosis not present

## 2018-06-10 DIAGNOSIS — G309 Alzheimer's disease, unspecified: Secondary | ICD-10-CM | POA: Diagnosis not present

## 2018-06-11 DIAGNOSIS — G309 Alzheimer's disease, unspecified: Secondary | ICD-10-CM | POA: Diagnosis not present

## 2018-06-12 ENCOUNTER — Ambulatory Visit: Payer: Medicare HMO | Admitting: Podiatry

## 2018-06-12 DIAGNOSIS — M6281 Muscle weakness (generalized): Secondary | ICD-10-CM | POA: Diagnosis not present

## 2018-06-12 DIAGNOSIS — R262 Difficulty in walking, not elsewhere classified: Secondary | ICD-10-CM | POA: Diagnosis not present

## 2018-06-12 DIAGNOSIS — G309 Alzheimer's disease, unspecified: Secondary | ICD-10-CM | POA: Diagnosis not present

## 2018-06-13 DIAGNOSIS — G309 Alzheimer's disease, unspecified: Secondary | ICD-10-CM | POA: Diagnosis not present

## 2018-06-13 DIAGNOSIS — M6281 Muscle weakness (generalized): Secondary | ICD-10-CM | POA: Diagnosis not present

## 2018-06-13 DIAGNOSIS — R262 Difficulty in walking, not elsewhere classified: Secondary | ICD-10-CM | POA: Diagnosis not present

## 2018-06-14 DIAGNOSIS — G309 Alzheimer's disease, unspecified: Secondary | ICD-10-CM | POA: Diagnosis not present

## 2018-06-15 DIAGNOSIS — G309 Alzheimer's disease, unspecified: Secondary | ICD-10-CM | POA: Diagnosis not present

## 2018-06-16 DIAGNOSIS — R262 Difficulty in walking, not elsewhere classified: Secondary | ICD-10-CM | POA: Diagnosis not present

## 2018-06-16 DIAGNOSIS — M6281 Muscle weakness (generalized): Secondary | ICD-10-CM | POA: Diagnosis not present

## 2018-06-16 DIAGNOSIS — G309 Alzheimer's disease, unspecified: Secondary | ICD-10-CM | POA: Diagnosis not present

## 2018-06-17 DIAGNOSIS — G309 Alzheimer's disease, unspecified: Secondary | ICD-10-CM | POA: Diagnosis not present

## 2018-06-18 DIAGNOSIS — R262 Difficulty in walking, not elsewhere classified: Secondary | ICD-10-CM | POA: Diagnosis not present

## 2018-06-18 DIAGNOSIS — G309 Alzheimer's disease, unspecified: Secondary | ICD-10-CM | POA: Diagnosis not present

## 2018-06-18 DIAGNOSIS — M6281 Muscle weakness (generalized): Secondary | ICD-10-CM | POA: Diagnosis not present

## 2018-06-19 DIAGNOSIS — G309 Alzheimer's disease, unspecified: Secondary | ICD-10-CM | POA: Diagnosis not present

## 2018-06-20 DIAGNOSIS — G309 Alzheimer's disease, unspecified: Secondary | ICD-10-CM | POA: Diagnosis not present

## 2018-06-20 DIAGNOSIS — M6281 Muscle weakness (generalized): Secondary | ICD-10-CM | POA: Diagnosis not present

## 2018-06-20 DIAGNOSIS — R262 Difficulty in walking, not elsewhere classified: Secondary | ICD-10-CM | POA: Diagnosis not present

## 2018-06-21 DIAGNOSIS — M6281 Muscle weakness (generalized): Secondary | ICD-10-CM | POA: Diagnosis not present

## 2018-06-21 DIAGNOSIS — G309 Alzheimer's disease, unspecified: Secondary | ICD-10-CM | POA: Diagnosis not present

## 2018-06-21 DIAGNOSIS — R262 Difficulty in walking, not elsewhere classified: Secondary | ICD-10-CM | POA: Diagnosis not present

## 2018-06-22 DIAGNOSIS — G309 Alzheimer's disease, unspecified: Secondary | ICD-10-CM | POA: Diagnosis not present

## 2018-06-23 DIAGNOSIS — G309 Alzheimer's disease, unspecified: Secondary | ICD-10-CM | POA: Diagnosis not present

## 2018-06-23 DIAGNOSIS — R262 Difficulty in walking, not elsewhere classified: Secondary | ICD-10-CM | POA: Diagnosis not present

## 2018-06-23 DIAGNOSIS — M6281 Muscle weakness (generalized): Secondary | ICD-10-CM | POA: Diagnosis not present

## 2018-06-24 DIAGNOSIS — G309 Alzheimer's disease, unspecified: Secondary | ICD-10-CM | POA: Diagnosis not present

## 2018-06-25 DIAGNOSIS — R262 Difficulty in walking, not elsewhere classified: Secondary | ICD-10-CM | POA: Diagnosis not present

## 2018-06-25 DIAGNOSIS — G309 Alzheimer's disease, unspecified: Secondary | ICD-10-CM | POA: Diagnosis not present

## 2018-06-25 DIAGNOSIS — M6281 Muscle weakness (generalized): Secondary | ICD-10-CM | POA: Diagnosis not present

## 2018-06-26 ENCOUNTER — Ambulatory Visit: Payer: Medicare HMO | Admitting: Orthotics

## 2018-06-26 ENCOUNTER — Ambulatory Visit (INDEPENDENT_AMBULATORY_CARE_PROVIDER_SITE_OTHER): Payer: Medicare HMO | Admitting: Podiatry

## 2018-06-26 DIAGNOSIS — R262 Difficulty in walking, not elsewhere classified: Secondary | ICD-10-CM | POA: Diagnosis not present

## 2018-06-26 DIAGNOSIS — E1151 Type 2 diabetes mellitus with diabetic peripheral angiopathy without gangrene: Secondary | ICD-10-CM

## 2018-06-26 DIAGNOSIS — E08621 Diabetes mellitus due to underlying condition with foot ulcer: Secondary | ICD-10-CM

## 2018-06-26 DIAGNOSIS — S98112A Complete traumatic amputation of left great toe, initial encounter: Secondary | ICD-10-CM

## 2018-06-26 DIAGNOSIS — M2041 Other hammer toe(s) (acquired), right foot: Secondary | ICD-10-CM

## 2018-06-26 DIAGNOSIS — L97421 Non-pressure chronic ulcer of left heel and midfoot limited to breakdown of skin: Secondary | ICD-10-CM

## 2018-06-26 DIAGNOSIS — I83009 Varicose veins of unspecified lower extremity with ulcer of unspecified site: Secondary | ICD-10-CM

## 2018-06-26 DIAGNOSIS — M6281 Muscle weakness (generalized): Secondary | ICD-10-CM | POA: Diagnosis not present

## 2018-06-26 DIAGNOSIS — G309 Alzheimer's disease, unspecified: Secondary | ICD-10-CM | POA: Diagnosis not present

## 2018-06-26 DIAGNOSIS — L97909 Non-pressure chronic ulcer of unspecified part of unspecified lower leg with unspecified severity: Secondary | ICD-10-CM

## 2018-06-26 DIAGNOSIS — M2042 Other hammer toe(s) (acquired), left foot: Secondary | ICD-10-CM

## 2018-06-26 DIAGNOSIS — L97921 Non-pressure chronic ulcer of unspecified part of left lower leg limited to breakdown of skin: Secondary | ICD-10-CM

## 2018-06-26 DIAGNOSIS — I872 Venous insufficiency (chronic) (peripheral): Secondary | ICD-10-CM

## 2018-06-26 NOTE — Progress Notes (Signed)
Shoes and inserts need to be sent back to [facility]; totally missed the toe filler measurements.

## 2018-06-26 NOTE — Patient Instructions (Signed)
Unna boot and compressive bandage to be applied by Assisted living facility. Changed twice a week by facility. Will have patient come back to the office weekly for dressing changes until healed.

## 2018-06-27 DIAGNOSIS — R262 Difficulty in walking, not elsewhere classified: Secondary | ICD-10-CM | POA: Diagnosis not present

## 2018-06-27 DIAGNOSIS — G309 Alzheimer's disease, unspecified: Secondary | ICD-10-CM | POA: Diagnosis not present

## 2018-06-27 DIAGNOSIS — M6281 Muscle weakness (generalized): Secondary | ICD-10-CM | POA: Diagnosis not present

## 2018-06-28 DIAGNOSIS — G309 Alzheimer's disease, unspecified: Secondary | ICD-10-CM | POA: Diagnosis not present

## 2018-06-29 DIAGNOSIS — G309 Alzheimer's disease, unspecified: Secondary | ICD-10-CM | POA: Diagnosis not present

## 2018-06-29 NOTE — Progress Notes (Signed)
  Subjective:  Patient ID: [name], male    DOB: 11-Nov-1945,  MRN: [id number]  No chief complaint on file.   73 y.o. male presents for wound care. New ulcers to both ankles - right lateral left medial   Review of Systems: Negative except as noted in the HPI. Denies N/V/F/Ch.  Past Medical History:  Diagnosis Date  . Coronary artery disease   . Diabetes mellitus    type II  . Gastritis   . Glaucoma    hx of  . Hypercholesterolemia   . Hypertension   . Insomnia   . Stroke ([place])    hx of multiple    Current Outpatient Medications:  .  acetaminophen (TYLENOL) 325 MG tablet, Take 650 mg by mouth 2 (two) times daily., Disp: , Rfl:  .  Acetaminophen 325 MG CAPS, Take 650 mg by mouth every 4 (four) hours as needed for mild pain., Disp: , Rfl:  .  aspirin 81 MG chewable tablet, Chew 81 mg by mouth daily. , Disp: , Rfl:  .  atorvastatin (LIPITOR) 40 MG tablet, Take 40 mg by mouth at bedtime. , Disp: , Rfl:  .  carvedilol (COREG) 3.125 MG tablet, Take 3.125 mg by mouth 2 (two) times daily. , Disp: , Rfl:  .  doxepin (SINEQUAN) 10 MG capsule, Take 10 mg by mouth at bedtime. , Disp: , Rfl:  .  escitalopram (LEXAPRO) 10 MG tablet, Take 10 mg by mouth daily. , Disp: , Rfl:  .  ferrous gluconate (FERGON) 324 MG tablet, Take 324 mg by mouth daily., Disp: , Rfl:  .  furosemide (LASIX) 40 MG tablet, Take 40 mg by mouth daily. , Disp: , Rfl:  .  insulin glargine (LANTUS) 100 UNIT/ML injection, Inject 5-19 Units into the skin See admin instructions. Inject 14 units every morning and 5 units every night at bedtime., Disp: , Rfl:  .  lisinopril (PRINIVIL,ZESTRIL) 5 MG tablet, Take 5 mg by mouth daily. , Disp: , Rfl:  .  metFORMIN (GLUCOPHAGE) 1000 MG tablet, Take 1,000 mg by mouth 2 (two) times daily. , Disp: , Rfl:   Social History   Tobacco Use  Smoking Status Never Smoker  Smokeless Tobacco Never Used    No Known Allergies Objective:  There were no vitals filed for this visit. There  is no height or weight on file to calculate BMI. Constitutional Well developed. Well nourished.  Vascular Dorsalis pedis pulses palpable bilaterally. Posterior tibial pulses palpable bilaterally. Capillary refill normal to all digits.  No cyanosis or clubbing noted. Pedal hair growth normal.  Neurologic Normal speech. Oriented to person, place, and time. Protective sensation absent  Dermatologic  bilateral ankle ulcerations granular base  Orthopedic: No pain to palpation either foot.   Radiographs: None Assessment:   1. Venous (peripheral) insufficiency    Plan:  Patient was evaluated and treated and all questions answered.  Venous leg ulcers bilateral ankles -Multilayer compression dressing applied bilateral -Discussed local wound care with facility -F/u in 1 week for Unna boot change  Procedure: Multilayer Compression dressing Rationale: venous insufficiency Technique: Unna boot, cast padding, Coban compression dressing applied Disposition: Patient tolerated procedure well.    Return in about 1 week (around 07/03/2018).

## 2018-06-30 DIAGNOSIS — E1169 Type 2 diabetes mellitus with other specified complication: Secondary | ICD-10-CM | POA: Diagnosis not present

## 2018-06-30 DIAGNOSIS — E11649 Type 2 diabetes mellitus with hypoglycemia without coma: Secondary | ICD-10-CM | POA: Diagnosis not present

## 2018-06-30 DIAGNOSIS — R262 Difficulty in walking, not elsewhere classified: Secondary | ICD-10-CM | POA: Diagnosis not present

## 2018-06-30 DIAGNOSIS — I87319 Chronic venous hypertension (idiopathic) with ulcer of unspecified lower extremity: Secondary | ICD-10-CM | POA: Diagnosis not present

## 2018-06-30 DIAGNOSIS — G301 Alzheimer's disease with late onset: Secondary | ICD-10-CM | POA: Diagnosis not present

## 2018-06-30 DIAGNOSIS — M6281 Muscle weakness (generalized): Secondary | ICD-10-CM | POA: Diagnosis not present

## 2018-06-30 DIAGNOSIS — G309 Alzheimer's disease, unspecified: Secondary | ICD-10-CM | POA: Diagnosis not present

## 2018-07-01 DIAGNOSIS — G309 Alzheimer's disease, unspecified: Secondary | ICD-10-CM | POA: Diagnosis not present

## 2018-07-02 ENCOUNTER — Telehealth: Payer: Self-pay | Admitting: Podiatry

## 2018-07-02 DIAGNOSIS — F0280 Dementia in other diseases classified elsewhere without behavioral disturbance: Secondary | ICD-10-CM | POA: Diagnosis not present

## 2018-07-02 DIAGNOSIS — L97929 Non-pressure chronic ulcer of unspecified part of left lower leg with unspecified severity: Secondary | ICD-10-CM | POA: Diagnosis not present

## 2018-07-02 DIAGNOSIS — G301 Alzheimer's disease with late onset: Secondary | ICD-10-CM | POA: Diagnosis not present

## 2018-07-02 DIAGNOSIS — F419 Anxiety disorder, unspecified: Secondary | ICD-10-CM | POA: Diagnosis not present

## 2018-07-02 DIAGNOSIS — I872 Venous insufficiency (chronic) (peripheral): Secondary | ICD-10-CM | POA: Diagnosis not present

## 2018-07-02 DIAGNOSIS — E1151 Type 2 diabetes mellitus with diabetic peripheral angiopathy without gangrene: Secondary | ICD-10-CM | POA: Diagnosis not present

## 2018-07-02 DIAGNOSIS — G309 Alzheimer's disease, unspecified: Secondary | ICD-10-CM | POA: Diagnosis not present

## 2018-07-02 DIAGNOSIS — R262 Difficulty in walking, not elsewhere classified: Secondary | ICD-10-CM | POA: Diagnosis not present

## 2018-07-02 DIAGNOSIS — M6281 Muscle weakness (generalized): Secondary | ICD-10-CM | POA: Diagnosis not present

## 2018-07-02 NOTE — Telephone Encounter (Signed)
Chris with [facility]. I'm trying to find out if pt is active with your center and what treatment are you providing? I'm asking as my referral is from pt's PCP. I can be reached at [phone].

## 2018-07-02 NOTE — Telephone Encounter (Signed)
I called [facility] and asked if we were wanting pt to have unna boots, they could do them on Monday and pt could be seen by Dr. March Rummage on Thursdays. I told Gerald Stabs I would pass the information to Dr. March Rummage, and get orders and call again.

## 2018-07-03 ENCOUNTER — Ambulatory Visit (INDEPENDENT_AMBULATORY_CARE_PROVIDER_SITE_OTHER): Payer: Medicare HMO | Admitting: Podiatry

## 2018-07-03 DIAGNOSIS — G309 Alzheimer's disease, unspecified: Secondary | ICD-10-CM | POA: Diagnosis not present

## 2018-07-03 DIAGNOSIS — I872 Venous insufficiency (chronic) (peripheral): Secondary | ICD-10-CM | POA: Diagnosis not present

## 2018-07-03 DIAGNOSIS — M6281 Muscle weakness (generalized): Secondary | ICD-10-CM | POA: Diagnosis not present

## 2018-07-03 DIAGNOSIS — L97921 Non-pressure chronic ulcer of unspecified part of left lower leg limited to breakdown of skin: Secondary | ICD-10-CM

## 2018-07-03 DIAGNOSIS — R262 Difficulty in walking, not elsewhere classified: Secondary | ICD-10-CM | POA: Diagnosis not present

## 2018-07-03 NOTE — Telephone Encounter (Signed)
I informed [name] of Dr. Eleanora Neighbor orders for [name] with coban to left leg.

## 2018-07-03 NOTE — Progress Notes (Signed)
Subjective:  Patient ID: [name], male    DOB: [birth year],  MRN: [id number]  Chief Complaint  Patient presents with  . Diabetic Ulcer    bilateral unna boot change    73 y.o. male presents for wound care.  Does not like the Unna boots and would like to avoid new ones possible   Review of Systems: Negative except as noted in the HPI. Denies N/V/F/Ch.  Past Medical History:  Diagnosis Date  . Coronary artery disease   . Diabetes mellitus    type II  . Gastritis   . Glaucoma    hx of  . Hypercholesterolemia   . Hypertension   . Insomnia   . Stroke ([place])    hx of multiple    Current Outpatient Medications:  .  acetaminophen (TYLENOL) 325 MG tablet, Take 650 mg by mouth 2 (two) times daily., Disp: , Rfl:  .  Acetaminophen 325 MG CAPS, Take 650 mg by mouth every 4 (four) hours as needed for mild pain., Disp: , Rfl:  .  aspirin 81 MG chewable tablet, Chew 81 mg by mouth daily. , Disp: , Rfl:  .  atorvastatin (LIPITOR) 40 MG tablet, Take 40 mg by mouth at bedtime. , Disp: , Rfl:  .  carvedilol (COREG) 3.125 MG tablet, Take 3.125 mg by mouth 2 (two) times daily. , Disp: , Rfl:  .  doxepin (SINEQUAN) 10 MG capsule, Take 10 mg by mouth at bedtime. , Disp: , Rfl:  .  escitalopram (LEXAPRO) 10 MG tablet, Take 10 mg by mouth daily. , Disp: , Rfl:  .  ferrous gluconate (FERGON) 324 MG tablet, Take 324 mg by mouth daily., Disp: , Rfl:  .  furosemide (LASIX) 40 MG tablet, Take 40 mg by mouth daily. , Disp: , Rfl:  .  insulin glargine (LANTUS) 100 UNIT/ML injection, Inject 5-19 Units into the skin See admin instructions. Inject 14 units every morning and 5 units every night at bedtime., Disp: , Rfl:  .  lisinopril (PRINIVIL,ZESTRIL) 5 MG tablet, Take 5 mg by mouth daily. , Disp: , Rfl:  .  metFORMIN (GLUCOPHAGE) 1000 MG tablet, Take 1,000 mg by mouth 2 (two) times daily. , Disp: , Rfl:   Social History   Tobacco Use  Smoking Status Never Smoker  Smokeless Tobacco Never Used     No Known Allergies Objective:  There were no vitals filed for this visit. There is no height or weight on file to calculate BMI. Constitutional Well developed. Well nourished.  Vascular Dorsalis pedis pulses palpable bilaterally. Posterior tibial pulses palpable bilaterally. Capillary refill normal to all digits.  No cyanosis or clubbing noted. Pedal hair growth normal.  Neurologic Normal speech. Oriented to person, place, and time. Protective sensation absent  Dermatologic  1.5 x 1.5 granular wound medial aspect of the left ankle Right lateral ankle ulceration healed.  Orthopedic: No pain to palpation either foot.   Radiographs: None Assessment:   1. Venous (peripheral) insufficiency   2. Non-healing ulcer of lower leg, left, limited to breakdown of skin [facility])    Plan:  Patient was evaluated and treated and all questions answered.  Venous leg ulcers left ankles -Discussed with patient as much as he does not like the boots they are improving the ulcerations as the right one has healed within the last week.  We will plan for only 1 more week -Multilayer compression dressing applied left -Discussed local wound care with facility -F/u in 1 week for [employer]  boot change  Procedure: Multilayer Compression dressing Rationale: venous insufficiency Technique: Unna boot, cast padding, Coban compression dressing applied Disposition: Patient tolerated procedure well.   Follow-up in 1 week with RN for [employer] boot change  No follow-ups on file.

## 2018-07-03 NOTE — Telephone Encounter (Signed)
I admitted [name] to home care services today and we need the order faxed to [place]. If you want to continue using the unna boots we can see him every Monday and your office can see him on Thursdays. Our fax number is [phone].

## 2018-07-04 DIAGNOSIS — H401133 Primary open-angle glaucoma, bilateral, severe stage: Secondary | ICD-10-CM | POA: Diagnosis not present

## 2018-07-04 DIAGNOSIS — Z961 Presence of intraocular lens: Secondary | ICD-10-CM | POA: Diagnosis not present

## 2018-07-04 DIAGNOSIS — M6281 Muscle weakness (generalized): Secondary | ICD-10-CM | POA: Diagnosis not present

## 2018-07-04 DIAGNOSIS — R262 Difficulty in walking, not elsewhere classified: Secondary | ICD-10-CM | POA: Diagnosis not present

## 2018-07-04 DIAGNOSIS — G309 Alzheimer's disease, unspecified: Secondary | ICD-10-CM | POA: Diagnosis not present

## 2018-07-05 DIAGNOSIS — G309 Alzheimer's disease, unspecified: Secondary | ICD-10-CM | POA: Diagnosis not present

## 2018-07-06 DIAGNOSIS — G309 Alzheimer's disease, unspecified: Secondary | ICD-10-CM | POA: Diagnosis not present

## 2018-07-07 DIAGNOSIS — R262 Difficulty in walking, not elsewhere classified: Secondary | ICD-10-CM | POA: Diagnosis not present

## 2018-07-07 DIAGNOSIS — G309 Alzheimer's disease, unspecified: Secondary | ICD-10-CM | POA: Diagnosis not present

## 2018-07-07 DIAGNOSIS — M6281 Muscle weakness (generalized): Secondary | ICD-10-CM | POA: Diagnosis not present

## 2018-07-08 DIAGNOSIS — G301 Alzheimer's disease with late onset: Secondary | ICD-10-CM | POA: Diagnosis not present

## 2018-07-08 DIAGNOSIS — L97929 Non-pressure chronic ulcer of unspecified part of left lower leg with unspecified severity: Secondary | ICD-10-CM | POA: Diagnosis not present

## 2018-07-08 DIAGNOSIS — I872 Venous insufficiency (chronic) (peripheral): Secondary | ICD-10-CM | POA: Diagnosis not present

## 2018-07-08 DIAGNOSIS — G309 Alzheimer's disease, unspecified: Secondary | ICD-10-CM | POA: Diagnosis not present

## 2018-07-08 DIAGNOSIS — R262 Difficulty in walking, not elsewhere classified: Secondary | ICD-10-CM | POA: Diagnosis not present

## 2018-07-08 DIAGNOSIS — F419 Anxiety disorder, unspecified: Secondary | ICD-10-CM | POA: Diagnosis not present

## 2018-07-08 DIAGNOSIS — M6281 Muscle weakness (generalized): Secondary | ICD-10-CM | POA: Diagnosis not present

## 2018-07-08 DIAGNOSIS — E1151 Type 2 diabetes mellitus with diabetic peripheral angiopathy without gangrene: Secondary | ICD-10-CM | POA: Diagnosis not present

## 2018-07-08 DIAGNOSIS — F0280 Dementia in other diseases classified elsewhere without behavioral disturbance: Secondary | ICD-10-CM | POA: Diagnosis not present

## 2018-07-09 DIAGNOSIS — G309 Alzheimer's disease, unspecified: Secondary | ICD-10-CM | POA: Diagnosis not present

## 2018-07-09 DIAGNOSIS — R262 Difficulty in walking, not elsewhere classified: Secondary | ICD-10-CM | POA: Diagnosis not present

## 2018-07-09 DIAGNOSIS — M6281 Muscle weakness (generalized): Secondary | ICD-10-CM | POA: Diagnosis not present

## 2018-07-10 ENCOUNTER — Other Ambulatory Visit: Payer: Medicare HMO

## 2018-07-11 DIAGNOSIS — G309 Alzheimer's disease, unspecified: Secondary | ICD-10-CM | POA: Diagnosis not present

## 2018-07-11 DIAGNOSIS — R262 Difficulty in walking, not elsewhere classified: Secondary | ICD-10-CM | POA: Diagnosis not present

## 2018-07-11 DIAGNOSIS — M6281 Muscle weakness (generalized): Secondary | ICD-10-CM | POA: Diagnosis not present

## 2018-07-14 ENCOUNTER — Telehealth: Payer: Self-pay | Admitting: *Deleted

## 2018-07-14 DIAGNOSIS — G301 Alzheimer's disease with late onset: Secondary | ICD-10-CM | POA: Diagnosis not present

## 2018-07-14 DIAGNOSIS — L97929 Non-pressure chronic ulcer of unspecified part of left lower leg with unspecified severity: Secondary | ICD-10-CM | POA: Diagnosis not present

## 2018-07-14 DIAGNOSIS — E1151 Type 2 diabetes mellitus with diabetic peripheral angiopathy without gangrene: Secondary | ICD-10-CM | POA: Diagnosis not present

## 2018-07-14 DIAGNOSIS — F0280 Dementia in other diseases classified elsewhere without behavioral disturbance: Secondary | ICD-10-CM | POA: Diagnosis not present

## 2018-07-14 DIAGNOSIS — R262 Difficulty in walking, not elsewhere classified: Secondary | ICD-10-CM | POA: Diagnosis not present

## 2018-07-14 DIAGNOSIS — I872 Venous insufficiency (chronic) (peripheral): Secondary | ICD-10-CM | POA: Diagnosis not present

## 2018-07-14 DIAGNOSIS — M6281 Muscle weakness (generalized): Secondary | ICD-10-CM | POA: Diagnosis not present

## 2018-07-14 DIAGNOSIS — F419 Anxiety disorder, unspecified: Secondary | ICD-10-CM | POA: Diagnosis not present

## 2018-07-14 DIAGNOSIS — G309 Alzheimer's disease, unspecified: Secondary | ICD-10-CM | POA: Diagnosis not present

## 2018-07-14 NOTE — Telephone Encounter (Signed)
Phillis Knack states has left leg medial ankle 1.5 x 1.5 x 0.2cm no odor, swelling, is bleeding with raised edges and granulated center and no other wounds documented, and had been wrapping unna boot, but no wound was in the notes from their nurse. I told Maudie Mercury, Dr. March Rummage was treating pt for left medial ankle wound with the same measurements at last office visit, should continue the unna boots and get pt in to be seen 07/07/2018 10:30am for possible change in wound care orders. Maudie Mercury states she will alert the care facility for transportation to the appt.

## 2018-07-14 NOTE — Telephone Encounter (Signed)
Noted thanks °

## 2018-07-15 DIAGNOSIS — M6281 Muscle weakness (generalized): Secondary | ICD-10-CM | POA: Diagnosis not present

## 2018-07-15 DIAGNOSIS — G309 Alzheimer's disease, unspecified: Secondary | ICD-10-CM | POA: Diagnosis not present

## 2018-07-15 DIAGNOSIS — R262 Difficulty in walking, not elsewhere classified: Secondary | ICD-10-CM | POA: Diagnosis not present

## 2018-07-16 DIAGNOSIS — G309 Alzheimer's disease, unspecified: Secondary | ICD-10-CM | POA: Diagnosis not present

## 2018-07-16 DIAGNOSIS — M6281 Muscle weakness (generalized): Secondary | ICD-10-CM | POA: Diagnosis not present

## 2018-07-16 DIAGNOSIS — R262 Difficulty in walking, not elsewhere classified: Secondary | ICD-10-CM | POA: Diagnosis not present

## 2018-07-17 DIAGNOSIS — G309 Alzheimer's disease, unspecified: Secondary | ICD-10-CM | POA: Diagnosis not present

## 2018-07-18 DIAGNOSIS — M6281 Muscle weakness (generalized): Secondary | ICD-10-CM | POA: Diagnosis not present

## 2018-07-18 DIAGNOSIS — R262 Difficulty in walking, not elsewhere classified: Secondary | ICD-10-CM | POA: Diagnosis not present

## 2018-07-18 DIAGNOSIS — G309 Alzheimer's disease, unspecified: Secondary | ICD-10-CM | POA: Diagnosis not present

## 2018-07-19 DIAGNOSIS — G309 Alzheimer's disease, unspecified: Secondary | ICD-10-CM | POA: Diagnosis not present

## 2018-07-20 DIAGNOSIS — G309 Alzheimer's disease, unspecified: Secondary | ICD-10-CM | POA: Diagnosis not present

## 2018-07-21 DIAGNOSIS — G309 Alzheimer's disease, unspecified: Secondary | ICD-10-CM | POA: Diagnosis not present

## 2018-07-21 DIAGNOSIS — M6281 Muscle weakness (generalized): Secondary | ICD-10-CM | POA: Diagnosis not present

## 2018-07-21 DIAGNOSIS — E11649 Type 2 diabetes mellitus with hypoglycemia without coma: Secondary | ICD-10-CM | POA: Diagnosis not present

## 2018-07-21 DIAGNOSIS — G301 Alzheimer's disease with late onset: Secondary | ICD-10-CM | POA: Diagnosis not present

## 2018-07-21 DIAGNOSIS — R262 Difficulty in walking, not elsewhere classified: Secondary | ICD-10-CM | POA: Diagnosis not present

## 2018-07-21 DIAGNOSIS — E1169 Type 2 diabetes mellitus with other specified complication: Secondary | ICD-10-CM | POA: Diagnosis not present

## 2018-07-22 DIAGNOSIS — E1151 Type 2 diabetes mellitus with diabetic peripheral angiopathy without gangrene: Secondary | ICD-10-CM | POA: Diagnosis not present

## 2018-07-22 DIAGNOSIS — G301 Alzheimer's disease with late onset: Secondary | ICD-10-CM | POA: Diagnosis not present

## 2018-07-22 DIAGNOSIS — F419 Anxiety disorder, unspecified: Secondary | ICD-10-CM | POA: Diagnosis not present

## 2018-07-22 DIAGNOSIS — G309 Alzheimer's disease, unspecified: Secondary | ICD-10-CM | POA: Diagnosis not present

## 2018-07-22 DIAGNOSIS — L97929 Non-pressure chronic ulcer of unspecified part of left lower leg with unspecified severity: Secondary | ICD-10-CM | POA: Diagnosis not present

## 2018-07-22 DIAGNOSIS — F0280 Dementia in other diseases classified elsewhere without behavioral disturbance: Secondary | ICD-10-CM | POA: Diagnosis not present

## 2018-07-22 DIAGNOSIS — I872 Venous insufficiency (chronic) (peripheral): Secondary | ICD-10-CM | POA: Diagnosis not present

## 2018-07-23 DIAGNOSIS — M6281 Muscle weakness (generalized): Secondary | ICD-10-CM | POA: Diagnosis not present

## 2018-07-23 DIAGNOSIS — R262 Difficulty in walking, not elsewhere classified: Secondary | ICD-10-CM | POA: Diagnosis not present

## 2018-07-23 DIAGNOSIS — G309 Alzheimer's disease, unspecified: Secondary | ICD-10-CM | POA: Diagnosis not present

## 2018-07-24 DIAGNOSIS — G309 Alzheimer's disease, unspecified: Secondary | ICD-10-CM | POA: Diagnosis not present

## 2018-07-25 DIAGNOSIS — G309 Alzheimer's disease, unspecified: Secondary | ICD-10-CM | POA: Diagnosis not present

## 2018-07-26 DIAGNOSIS — G309 Alzheimer's disease, unspecified: Secondary | ICD-10-CM | POA: Diagnosis not present

## 2018-07-27 DIAGNOSIS — G309 Alzheimer's disease, unspecified: Secondary | ICD-10-CM | POA: Diagnosis not present

## 2018-07-28 ENCOUNTER — Ambulatory Visit: Payer: Medicare HMO | Admitting: Endocrinology

## 2018-07-28 DIAGNOSIS — I872 Venous insufficiency (chronic) (peripheral): Secondary | ICD-10-CM | POA: Diagnosis not present

## 2018-07-28 DIAGNOSIS — F0280 Dementia in other diseases classified elsewhere without behavioral disturbance: Secondary | ICD-10-CM | POA: Diagnosis not present

## 2018-07-28 DIAGNOSIS — G309 Alzheimer's disease, unspecified: Secondary | ICD-10-CM | POA: Diagnosis not present

## 2018-07-28 DIAGNOSIS — L97929 Non-pressure chronic ulcer of unspecified part of left lower leg with unspecified severity: Secondary | ICD-10-CM | POA: Diagnosis not present

## 2018-07-28 DIAGNOSIS — E1151 Type 2 diabetes mellitus with diabetic peripheral angiopathy without gangrene: Secondary | ICD-10-CM | POA: Diagnosis not present

## 2018-07-28 DIAGNOSIS — F419 Anxiety disorder, unspecified: Secondary | ICD-10-CM | POA: Diagnosis not present

## 2018-07-28 DIAGNOSIS — G301 Alzheimer's disease with late onset: Secondary | ICD-10-CM | POA: Diagnosis not present

## 2018-07-29 DIAGNOSIS — G309 Alzheimer's disease, unspecified: Secondary | ICD-10-CM | POA: Diagnosis not present

## 2018-07-30 DIAGNOSIS — G309 Alzheimer's disease, unspecified: Secondary | ICD-10-CM | POA: Diagnosis not present

## 2018-07-31 DIAGNOSIS — G309 Alzheimer's disease, unspecified: Secondary | ICD-10-CM | POA: Diagnosis not present

## 2018-08-01 ENCOUNTER — Other Ambulatory Visit: Payer: Medicare HMO | Admitting: Orthotics

## 2018-08-01 DIAGNOSIS — G309 Alzheimer's disease, unspecified: Secondary | ICD-10-CM | POA: Diagnosis not present

## 2018-08-02 DIAGNOSIS — G309 Alzheimer's disease, unspecified: Secondary | ICD-10-CM | POA: Diagnosis not present

## 2018-08-03 DIAGNOSIS — G309 Alzheimer's disease, unspecified: Secondary | ICD-10-CM | POA: Diagnosis not present

## 2018-08-04 ENCOUNTER — Ambulatory Visit: Payer: Medicare HMO | Admitting: Endocrinology

## 2018-08-04 DIAGNOSIS — E1169 Type 2 diabetes mellitus with other specified complication: Secondary | ICD-10-CM | POA: Diagnosis not present

## 2018-08-04 DIAGNOSIS — R296 Repeated falls: Secondary | ICD-10-CM | POA: Diagnosis not present

## 2018-08-04 DIAGNOSIS — F0280 Dementia in other diseases classified elsewhere without behavioral disturbance: Secondary | ICD-10-CM | POA: Diagnosis not present

## 2018-08-04 DIAGNOSIS — I872 Venous insufficiency (chronic) (peripheral): Secondary | ICD-10-CM | POA: Diagnosis not present

## 2018-08-04 DIAGNOSIS — G309 Alzheimer's disease, unspecified: Secondary | ICD-10-CM | POA: Diagnosis not present

## 2018-08-04 DIAGNOSIS — E11649 Type 2 diabetes mellitus with hypoglycemia without coma: Secondary | ICD-10-CM | POA: Diagnosis not present

## 2018-08-04 DIAGNOSIS — F419 Anxiety disorder, unspecified: Secondary | ICD-10-CM | POA: Diagnosis not present

## 2018-08-04 DIAGNOSIS — E1151 Type 2 diabetes mellitus with diabetic peripheral angiopathy without gangrene: Secondary | ICD-10-CM | POA: Diagnosis not present

## 2018-08-04 DIAGNOSIS — L97929 Non-pressure chronic ulcer of unspecified part of left lower leg with unspecified severity: Secondary | ICD-10-CM | POA: Diagnosis not present

## 2018-08-04 DIAGNOSIS — G301 Alzheimer's disease with late onset: Secondary | ICD-10-CM | POA: Diagnosis not present

## 2018-08-05 DIAGNOSIS — G309 Alzheimer's disease, unspecified: Secondary | ICD-10-CM | POA: Diagnosis not present

## 2018-08-06 DIAGNOSIS — G309 Alzheimer's disease, unspecified: Secondary | ICD-10-CM | POA: Diagnosis not present

## 2018-08-07 DIAGNOSIS — G309 Alzheimer's disease, unspecified: Secondary | ICD-10-CM | POA: Diagnosis not present

## 2018-08-08 DIAGNOSIS — G309 Alzheimer's disease, unspecified: Secondary | ICD-10-CM | POA: Diagnosis not present

## 2018-08-09 DIAGNOSIS — G309 Alzheimer's disease, unspecified: Secondary | ICD-10-CM | POA: Diagnosis not present

## 2018-08-10 DIAGNOSIS — G309 Alzheimer's disease, unspecified: Secondary | ICD-10-CM | POA: Diagnosis not present

## 2018-08-11 DIAGNOSIS — F0280 Dementia in other diseases classified elsewhere without behavioral disturbance: Secondary | ICD-10-CM | POA: Diagnosis not present

## 2018-08-11 DIAGNOSIS — G309 Alzheimer's disease, unspecified: Secondary | ICD-10-CM | POA: Diagnosis not present

## 2018-08-11 DIAGNOSIS — F419 Anxiety disorder, unspecified: Secondary | ICD-10-CM | POA: Diagnosis not present

## 2018-08-11 DIAGNOSIS — I872 Venous insufficiency (chronic) (peripheral): Secondary | ICD-10-CM | POA: Diagnosis not present

## 2018-08-11 DIAGNOSIS — E1151 Type 2 diabetes mellitus with diabetic peripheral angiopathy without gangrene: Secondary | ICD-10-CM | POA: Diagnosis not present

## 2018-08-11 DIAGNOSIS — G301 Alzheimer's disease with late onset: Secondary | ICD-10-CM | POA: Diagnosis not present

## 2018-08-11 DIAGNOSIS — L97929 Non-pressure chronic ulcer of unspecified part of left lower leg with unspecified severity: Secondary | ICD-10-CM | POA: Diagnosis not present

## 2018-08-12 DIAGNOSIS — G309 Alzheimer's disease, unspecified: Secondary | ICD-10-CM | POA: Diagnosis not present

## 2018-08-13 DIAGNOSIS — G309 Alzheimer's disease, unspecified: Secondary | ICD-10-CM | POA: Diagnosis not present

## 2018-08-14 DIAGNOSIS — G309 Alzheimer's disease, unspecified: Secondary | ICD-10-CM | POA: Diagnosis not present

## 2018-08-15 DIAGNOSIS — G309 Alzheimer's disease, unspecified: Secondary | ICD-10-CM | POA: Diagnosis not present

## 2018-08-16 DIAGNOSIS — G309 Alzheimer's disease, unspecified: Secondary | ICD-10-CM | POA: Diagnosis not present

## 2018-08-17 DIAGNOSIS — G309 Alzheimer's disease, unspecified: Secondary | ICD-10-CM | POA: Diagnosis not present

## 2018-08-18 DIAGNOSIS — L97929 Non-pressure chronic ulcer of unspecified part of left lower leg with unspecified severity: Secondary | ICD-10-CM | POA: Diagnosis not present

## 2018-08-18 DIAGNOSIS — G301 Alzheimer's disease with late onset: Secondary | ICD-10-CM | POA: Diagnosis not present

## 2018-08-18 DIAGNOSIS — G309 Alzheimer's disease, unspecified: Secondary | ICD-10-CM | POA: Diagnosis not present

## 2018-08-18 DIAGNOSIS — E1151 Type 2 diabetes mellitus with diabetic peripheral angiopathy without gangrene: Secondary | ICD-10-CM | POA: Diagnosis not present

## 2018-08-18 DIAGNOSIS — I872 Venous insufficiency (chronic) (peripheral): Secondary | ICD-10-CM | POA: Diagnosis not present

## 2018-08-18 DIAGNOSIS — F419 Anxiety disorder, unspecified: Secondary | ICD-10-CM | POA: Diagnosis not present

## 2018-08-18 DIAGNOSIS — F0280 Dementia in other diseases classified elsewhere without behavioral disturbance: Secondary | ICD-10-CM | POA: Diagnosis not present

## 2018-08-19 DIAGNOSIS — G309 Alzheimer's disease, unspecified: Secondary | ICD-10-CM | POA: Diagnosis not present

## 2018-08-20 DIAGNOSIS — G309 Alzheimer's disease, unspecified: Secondary | ICD-10-CM | POA: Diagnosis not present

## 2018-08-21 DIAGNOSIS — G309 Alzheimer's disease, unspecified: Secondary | ICD-10-CM | POA: Diagnosis not present

## 2018-08-22 DIAGNOSIS — G309 Alzheimer's disease, unspecified: Secondary | ICD-10-CM | POA: Diagnosis not present

## 2018-08-23 DIAGNOSIS — G309 Alzheimer's disease, unspecified: Secondary | ICD-10-CM | POA: Diagnosis not present

## 2018-08-24 DIAGNOSIS — G309 Alzheimer's disease, unspecified: Secondary | ICD-10-CM | POA: Diagnosis not present

## 2018-08-25 DIAGNOSIS — F0280 Dementia in other diseases classified elsewhere without behavioral disturbance: Secondary | ICD-10-CM | POA: Diagnosis not present

## 2018-08-25 DIAGNOSIS — L97929 Non-pressure chronic ulcer of unspecified part of left lower leg with unspecified severity: Secondary | ICD-10-CM | POA: Diagnosis not present

## 2018-08-25 DIAGNOSIS — G301 Alzheimer's disease with late onset: Secondary | ICD-10-CM | POA: Diagnosis not present

## 2018-08-25 DIAGNOSIS — E1151 Type 2 diabetes mellitus with diabetic peripheral angiopathy without gangrene: Secondary | ICD-10-CM | POA: Diagnosis not present

## 2018-08-25 DIAGNOSIS — I872 Venous insufficiency (chronic) (peripheral): Secondary | ICD-10-CM | POA: Diagnosis not present

## 2018-08-25 DIAGNOSIS — G309 Alzheimer's disease, unspecified: Secondary | ICD-10-CM | POA: Diagnosis not present

## 2018-08-25 DIAGNOSIS — F419 Anxiety disorder, unspecified: Secondary | ICD-10-CM | POA: Diagnosis not present

## 2018-08-26 DIAGNOSIS — G309 Alzheimer's disease, unspecified: Secondary | ICD-10-CM | POA: Diagnosis not present

## 2018-08-27 DIAGNOSIS — G309 Alzheimer's disease, unspecified: Secondary | ICD-10-CM | POA: Diagnosis not present

## 2018-08-28 DIAGNOSIS — G309 Alzheimer's disease, unspecified: Secondary | ICD-10-CM | POA: Diagnosis not present

## 2018-08-29 DIAGNOSIS — G309 Alzheimer's disease, unspecified: Secondary | ICD-10-CM | POA: Diagnosis not present

## 2018-08-30 DIAGNOSIS — G309 Alzheimer's disease, unspecified: Secondary | ICD-10-CM | POA: Diagnosis not present

## 2018-08-31 DIAGNOSIS — G309 Alzheimer's disease, unspecified: Secondary | ICD-10-CM | POA: Diagnosis not present

## 2018-09-01 DIAGNOSIS — G309 Alzheimer's disease, unspecified: Secondary | ICD-10-CM | POA: Diagnosis not present

## 2018-09-02 DIAGNOSIS — G309 Alzheimer's disease, unspecified: Secondary | ICD-10-CM | POA: Diagnosis not present

## 2018-09-03 DIAGNOSIS — G309 Alzheimer's disease, unspecified: Secondary | ICD-10-CM | POA: Diagnosis not present

## 2018-09-04 DIAGNOSIS — G309 Alzheimer's disease, unspecified: Secondary | ICD-10-CM | POA: Diagnosis not present

## 2018-09-05 DIAGNOSIS — G309 Alzheimer's disease, unspecified: Secondary | ICD-10-CM | POA: Diagnosis not present

## 2018-09-06 DIAGNOSIS — G309 Alzheimer's disease, unspecified: Secondary | ICD-10-CM | POA: Diagnosis not present

## 2018-09-07 DIAGNOSIS — G309 Alzheimer's disease, unspecified: Secondary | ICD-10-CM | POA: Diagnosis not present

## 2018-09-22 DIAGNOSIS — E11649 Type 2 diabetes mellitus with hypoglycemia without coma: Secondary | ICD-10-CM | POA: Diagnosis not present

## 2018-09-22 DIAGNOSIS — E1169 Type 2 diabetes mellitus with other specified complication: Secondary | ICD-10-CM | POA: Diagnosis not present

## 2018-09-22 DIAGNOSIS — R296 Repeated falls: Secondary | ICD-10-CM | POA: Diagnosis not present

## 2018-09-22 DIAGNOSIS — G301 Alzheimer's disease with late onset: Secondary | ICD-10-CM | POA: Diagnosis not present

## 2018-10-09 DIAGNOSIS — E11621 Type 2 diabetes mellitus with foot ulcer: Secondary | ICD-10-CM | POA: Diagnosis not present

## 2018-10-09 DIAGNOSIS — I872 Venous insufficiency (chronic) (peripheral): Secondary | ICD-10-CM | POA: Diagnosis not present

## 2018-10-09 DIAGNOSIS — E1151 Type 2 diabetes mellitus with diabetic peripheral angiopathy without gangrene: Secondary | ICD-10-CM | POA: Diagnosis not present

## 2018-10-09 DIAGNOSIS — F0280 Dementia in other diseases classified elsewhere without behavioral disturbance: Secondary | ICD-10-CM | POA: Diagnosis not present

## 2018-10-09 DIAGNOSIS — S91312D Laceration without foreign body, left foot, subsequent encounter: Secondary | ICD-10-CM | POA: Diagnosis not present

## 2018-10-09 DIAGNOSIS — L97322 Non-pressure chronic ulcer of left ankle with fat layer exposed: Secondary | ICD-10-CM | POA: Diagnosis not present

## 2018-10-09 DIAGNOSIS — F419 Anxiety disorder, unspecified: Secondary | ICD-10-CM | POA: Diagnosis not present

## 2018-10-09 DIAGNOSIS — G4700 Insomnia, unspecified: Secondary | ICD-10-CM | POA: Diagnosis not present

## 2018-10-09 DIAGNOSIS — G301 Alzheimer's disease with late onset: Secondary | ICD-10-CM | POA: Diagnosis not present

## 2018-10-13 DIAGNOSIS — R509 Fever, unspecified: Secondary | ICD-10-CM | POA: Diagnosis not present

## 2018-10-13 DIAGNOSIS — E1169 Type 2 diabetes mellitus with other specified complication: Secondary | ICD-10-CM | POA: Diagnosis not present

## 2018-10-13 DIAGNOSIS — G301 Alzheimer's disease with late onset: Secondary | ICD-10-CM | POA: Diagnosis not present

## 2018-10-13 DIAGNOSIS — L89521 Pressure ulcer of left ankle, stage 1: Secondary | ICD-10-CM | POA: Diagnosis not present

## 2018-10-13 DIAGNOSIS — R4182 Altered mental status, unspecified: Secondary | ICD-10-CM | POA: Diagnosis not present

## 2018-10-14 DIAGNOSIS — L97322 Non-pressure chronic ulcer of left ankle with fat layer exposed: Secondary | ICD-10-CM | POA: Diagnosis not present

## 2018-10-14 DIAGNOSIS — F419 Anxiety disorder, unspecified: Secondary | ICD-10-CM | POA: Diagnosis not present

## 2018-10-14 DIAGNOSIS — S91312D Laceration without foreign body, left foot, subsequent encounter: Secondary | ICD-10-CM | POA: Diagnosis not present

## 2018-10-14 DIAGNOSIS — E1151 Type 2 diabetes mellitus with diabetic peripheral angiopathy without gangrene: Secondary | ICD-10-CM | POA: Diagnosis not present

## 2018-10-14 DIAGNOSIS — F0280 Dementia in other diseases classified elsewhere without behavioral disturbance: Secondary | ICD-10-CM | POA: Diagnosis not present

## 2018-10-14 DIAGNOSIS — G301 Alzheimer's disease with late onset: Secondary | ICD-10-CM | POA: Diagnosis not present

## 2018-10-14 DIAGNOSIS — E11621 Type 2 diabetes mellitus with foot ulcer: Secondary | ICD-10-CM | POA: Diagnosis not present

## 2018-10-14 DIAGNOSIS — G4700 Insomnia, unspecified: Secondary | ICD-10-CM | POA: Diagnosis not present

## 2018-10-14 DIAGNOSIS — I872 Venous insufficiency (chronic) (peripheral): Secondary | ICD-10-CM | POA: Diagnosis not present

## 2018-10-15 DIAGNOSIS — R509 Fever, unspecified: Secondary | ICD-10-CM | POA: Diagnosis not present

## 2018-10-15 DIAGNOSIS — E708 Other disorders of aromatic amino-acid metabolism: Secondary | ICD-10-CM | POA: Diagnosis not present

## 2018-10-15 DIAGNOSIS — D649 Anemia, unspecified: Secondary | ICD-10-CM | POA: Diagnosis not present

## 2018-10-15 DIAGNOSIS — N390 Urinary tract infection, site not specified: Secondary | ICD-10-CM | POA: Diagnosis not present

## 2018-10-17 DIAGNOSIS — S91312D Laceration without foreign body, left foot, subsequent encounter: Secondary | ICD-10-CM | POA: Diagnosis not present

## 2018-10-17 DIAGNOSIS — F0280 Dementia in other diseases classified elsewhere without behavioral disturbance: Secondary | ICD-10-CM | POA: Diagnosis not present

## 2018-10-17 DIAGNOSIS — G4700 Insomnia, unspecified: Secondary | ICD-10-CM | POA: Diagnosis not present

## 2018-10-17 DIAGNOSIS — L97322 Non-pressure chronic ulcer of left ankle with fat layer exposed: Secondary | ICD-10-CM | POA: Diagnosis not present

## 2018-10-17 DIAGNOSIS — E1151 Type 2 diabetes mellitus with diabetic peripheral angiopathy without gangrene: Secondary | ICD-10-CM | POA: Diagnosis not present

## 2018-10-17 DIAGNOSIS — E11621 Type 2 diabetes mellitus with foot ulcer: Secondary | ICD-10-CM | POA: Diagnosis not present

## 2018-10-17 DIAGNOSIS — G301 Alzheimer's disease with late onset: Secondary | ICD-10-CM | POA: Diagnosis not present

## 2018-10-17 DIAGNOSIS — F419 Anxiety disorder, unspecified: Secondary | ICD-10-CM | POA: Diagnosis not present

## 2018-10-17 DIAGNOSIS — I872 Venous insufficiency (chronic) (peripheral): Secondary | ICD-10-CM | POA: Diagnosis not present

## 2018-10-20 DIAGNOSIS — L89521 Pressure ulcer of left ankle, stage 1: Secondary | ICD-10-CM | POA: Diagnosis not present

## 2018-10-20 DIAGNOSIS — D649 Anemia, unspecified: Secondary | ICD-10-CM | POA: Diagnosis not present

## 2018-10-20 DIAGNOSIS — E1169 Type 2 diabetes mellitus with other specified complication: Secondary | ICD-10-CM | POA: Diagnosis not present

## 2018-10-20 DIAGNOSIS — G301 Alzheimer's disease with late onset: Secondary | ICD-10-CM | POA: Diagnosis not present

## 2018-10-21 DIAGNOSIS — F419 Anxiety disorder, unspecified: Secondary | ICD-10-CM | POA: Diagnosis not present

## 2018-10-21 DIAGNOSIS — E11621 Type 2 diabetes mellitus with foot ulcer: Secondary | ICD-10-CM | POA: Diagnosis not present

## 2018-10-21 DIAGNOSIS — F0280 Dementia in other diseases classified elsewhere without behavioral disturbance: Secondary | ICD-10-CM | POA: Diagnosis not present

## 2018-10-21 DIAGNOSIS — E1151 Type 2 diabetes mellitus with diabetic peripheral angiopathy without gangrene: Secondary | ICD-10-CM | POA: Diagnosis not present

## 2018-10-21 DIAGNOSIS — L97322 Non-pressure chronic ulcer of left ankle with fat layer exposed: Secondary | ICD-10-CM | POA: Diagnosis not present

## 2018-10-21 DIAGNOSIS — I872 Venous insufficiency (chronic) (peripheral): Secondary | ICD-10-CM | POA: Diagnosis not present

## 2018-10-21 DIAGNOSIS — S91312D Laceration without foreign body, left foot, subsequent encounter: Secondary | ICD-10-CM | POA: Diagnosis not present

## 2018-10-21 DIAGNOSIS — G4700 Insomnia, unspecified: Secondary | ICD-10-CM | POA: Diagnosis not present

## 2018-10-21 DIAGNOSIS — G301 Alzheimer's disease with late onset: Secondary | ICD-10-CM | POA: Diagnosis not present

## 2018-10-23 DIAGNOSIS — G301 Alzheimer's disease with late onset: Secondary | ICD-10-CM | POA: Diagnosis not present

## 2018-10-23 DIAGNOSIS — F419 Anxiety disorder, unspecified: Secondary | ICD-10-CM | POA: Diagnosis not present

## 2018-10-23 DIAGNOSIS — S91312D Laceration without foreign body, left foot, subsequent encounter: Secondary | ICD-10-CM | POA: Diagnosis not present

## 2018-10-23 DIAGNOSIS — G4700 Insomnia, unspecified: Secondary | ICD-10-CM | POA: Diagnosis not present

## 2018-10-23 DIAGNOSIS — E11621 Type 2 diabetes mellitus with foot ulcer: Secondary | ICD-10-CM | POA: Diagnosis not present

## 2018-10-23 DIAGNOSIS — I872 Venous insufficiency (chronic) (peripheral): Secondary | ICD-10-CM | POA: Diagnosis not present

## 2018-10-23 DIAGNOSIS — F0280 Dementia in other diseases classified elsewhere without behavioral disturbance: Secondary | ICD-10-CM | POA: Diagnosis not present

## 2018-10-23 DIAGNOSIS — L97322 Non-pressure chronic ulcer of left ankle with fat layer exposed: Secondary | ICD-10-CM | POA: Diagnosis not present

## 2018-10-23 DIAGNOSIS — E1151 Type 2 diabetes mellitus with diabetic peripheral angiopathy without gangrene: Secondary | ICD-10-CM | POA: Diagnosis not present

## 2018-10-24 DIAGNOSIS — E11621 Type 2 diabetes mellitus with foot ulcer: Secondary | ICD-10-CM | POA: Diagnosis not present

## 2018-10-24 DIAGNOSIS — F0280 Dementia in other diseases classified elsewhere without behavioral disturbance: Secondary | ICD-10-CM | POA: Diagnosis not present

## 2018-10-24 DIAGNOSIS — L97322 Non-pressure chronic ulcer of left ankle with fat layer exposed: Secondary | ICD-10-CM | POA: Diagnosis not present

## 2018-10-24 DIAGNOSIS — G301 Alzheimer's disease with late onset: Secondary | ICD-10-CM | POA: Diagnosis not present

## 2018-10-24 DIAGNOSIS — I872 Venous insufficiency (chronic) (peripheral): Secondary | ICD-10-CM | POA: Diagnosis not present

## 2018-10-24 DIAGNOSIS — E1151 Type 2 diabetes mellitus with diabetic peripheral angiopathy without gangrene: Secondary | ICD-10-CM | POA: Diagnosis not present

## 2018-10-24 DIAGNOSIS — G4700 Insomnia, unspecified: Secondary | ICD-10-CM | POA: Diagnosis not present

## 2018-10-24 DIAGNOSIS — S91312D Laceration without foreign body, left foot, subsequent encounter: Secondary | ICD-10-CM | POA: Diagnosis not present

## 2018-10-24 DIAGNOSIS — F419 Anxiety disorder, unspecified: Secondary | ICD-10-CM | POA: Diagnosis not present

## 2018-10-27 DIAGNOSIS — E1169 Type 2 diabetes mellitus with other specified complication: Secondary | ICD-10-CM | POA: Diagnosis not present

## 2018-10-27 DIAGNOSIS — R296 Repeated falls: Secondary | ICD-10-CM | POA: Diagnosis not present

## 2018-10-27 DIAGNOSIS — F419 Anxiety disorder, unspecified: Secondary | ICD-10-CM | POA: Diagnosis not present

## 2018-10-27 DIAGNOSIS — S91312D Laceration without foreign body, left foot, subsequent encounter: Secondary | ICD-10-CM | POA: Diagnosis not present

## 2018-10-27 DIAGNOSIS — E11621 Type 2 diabetes mellitus with foot ulcer: Secondary | ICD-10-CM | POA: Diagnosis not present

## 2018-10-27 DIAGNOSIS — G301 Alzheimer's disease with late onset: Secondary | ICD-10-CM | POA: Diagnosis not present

## 2018-10-27 DIAGNOSIS — F0280 Dementia in other diseases classified elsewhere without behavioral disturbance: Secondary | ICD-10-CM | POA: Diagnosis not present

## 2018-10-27 DIAGNOSIS — L97322 Non-pressure chronic ulcer of left ankle with fat layer exposed: Secondary | ICD-10-CM | POA: Diagnosis not present

## 2018-10-27 DIAGNOSIS — G4700 Insomnia, unspecified: Secondary | ICD-10-CM | POA: Diagnosis not present

## 2018-10-27 DIAGNOSIS — E1151 Type 2 diabetes mellitus with diabetic peripheral angiopathy without gangrene: Secondary | ICD-10-CM | POA: Diagnosis not present

## 2018-10-27 DIAGNOSIS — L89521 Pressure ulcer of left ankle, stage 1: Secondary | ICD-10-CM | POA: Diagnosis not present

## 2018-10-27 DIAGNOSIS — I872 Venous insufficiency (chronic) (peripheral): Secondary | ICD-10-CM | POA: Diagnosis not present

## 2018-10-28 DIAGNOSIS — G301 Alzheimer's disease with late onset: Secondary | ICD-10-CM | POA: Diagnosis not present

## 2018-10-28 DIAGNOSIS — E1151 Type 2 diabetes mellitus with diabetic peripheral angiopathy without gangrene: Secondary | ICD-10-CM | POA: Diagnosis not present

## 2018-10-28 DIAGNOSIS — I872 Venous insufficiency (chronic) (peripheral): Secondary | ICD-10-CM | POA: Diagnosis not present

## 2018-10-28 DIAGNOSIS — L97322 Non-pressure chronic ulcer of left ankle with fat layer exposed: Secondary | ICD-10-CM | POA: Diagnosis not present

## 2018-10-28 DIAGNOSIS — F0280 Dementia in other diseases classified elsewhere without behavioral disturbance: Secondary | ICD-10-CM | POA: Diagnosis not present

## 2018-10-28 DIAGNOSIS — G4700 Insomnia, unspecified: Secondary | ICD-10-CM | POA: Diagnosis not present

## 2018-10-28 DIAGNOSIS — F419 Anxiety disorder, unspecified: Secondary | ICD-10-CM | POA: Diagnosis not present

## 2018-10-28 DIAGNOSIS — E11621 Type 2 diabetes mellitus with foot ulcer: Secondary | ICD-10-CM | POA: Diagnosis not present

## 2018-10-28 DIAGNOSIS — S91312D Laceration without foreign body, left foot, subsequent encounter: Secondary | ICD-10-CM | POA: Diagnosis not present

## 2018-10-30 DIAGNOSIS — E11621 Type 2 diabetes mellitus with foot ulcer: Secondary | ICD-10-CM | POA: Diagnosis not present

## 2018-10-30 DIAGNOSIS — I872 Venous insufficiency (chronic) (peripheral): Secondary | ICD-10-CM | POA: Diagnosis not present

## 2018-10-30 DIAGNOSIS — L97322 Non-pressure chronic ulcer of left ankle with fat layer exposed: Secondary | ICD-10-CM | POA: Diagnosis not present

## 2018-10-30 DIAGNOSIS — F419 Anxiety disorder, unspecified: Secondary | ICD-10-CM | POA: Diagnosis not present

## 2018-10-30 DIAGNOSIS — G4700 Insomnia, unspecified: Secondary | ICD-10-CM | POA: Diagnosis not present

## 2018-10-30 DIAGNOSIS — S91312D Laceration without foreign body, left foot, subsequent encounter: Secondary | ICD-10-CM | POA: Diagnosis not present

## 2018-10-30 DIAGNOSIS — F0280 Dementia in other diseases classified elsewhere without behavioral disturbance: Secondary | ICD-10-CM | POA: Diagnosis not present

## 2018-10-30 DIAGNOSIS — G301 Alzheimer's disease with late onset: Secondary | ICD-10-CM | POA: Diagnosis not present

## 2018-10-30 DIAGNOSIS — E1151 Type 2 diabetes mellitus with diabetic peripheral angiopathy without gangrene: Secondary | ICD-10-CM | POA: Diagnosis not present

## 2018-11-03 DIAGNOSIS — F419 Anxiety disorder, unspecified: Secondary | ICD-10-CM | POA: Diagnosis not present

## 2018-11-03 DIAGNOSIS — I872 Venous insufficiency (chronic) (peripheral): Secondary | ICD-10-CM | POA: Diagnosis not present

## 2018-11-03 DIAGNOSIS — E1151 Type 2 diabetes mellitus with diabetic peripheral angiopathy without gangrene: Secondary | ICD-10-CM | POA: Diagnosis not present

## 2018-11-03 DIAGNOSIS — G301 Alzheimer's disease with late onset: Secondary | ICD-10-CM | POA: Diagnosis not present

## 2018-11-03 DIAGNOSIS — E11621 Type 2 diabetes mellitus with foot ulcer: Secondary | ICD-10-CM | POA: Diagnosis not present

## 2018-11-03 DIAGNOSIS — G4700 Insomnia, unspecified: Secondary | ICD-10-CM | POA: Diagnosis not present

## 2018-11-03 DIAGNOSIS — L97322 Non-pressure chronic ulcer of left ankle with fat layer exposed: Secondary | ICD-10-CM | POA: Diagnosis not present

## 2018-11-03 DIAGNOSIS — S91312D Laceration without foreign body, left foot, subsequent encounter: Secondary | ICD-10-CM | POA: Diagnosis not present

## 2018-11-03 DIAGNOSIS — F0280 Dementia in other diseases classified elsewhere without behavioral disturbance: Secondary | ICD-10-CM | POA: Diagnosis not present

## 2018-11-05 DIAGNOSIS — E119 Type 2 diabetes mellitus without complications: Secondary | ICD-10-CM | POA: Diagnosis not present

## 2018-11-05 DIAGNOSIS — N189 Chronic kidney disease, unspecified: Secondary | ICD-10-CM | POA: Diagnosis not present

## 2018-11-05 DIAGNOSIS — D649 Anemia, unspecified: Secondary | ICD-10-CM | POA: Diagnosis not present

## 2018-11-11 DIAGNOSIS — G301 Alzheimer's disease with late onset: Secondary | ICD-10-CM | POA: Diagnosis not present

## 2018-11-11 DIAGNOSIS — R296 Repeated falls: Secondary | ICD-10-CM | POA: Diagnosis not present

## 2018-11-11 DIAGNOSIS — L89521 Pressure ulcer of left ankle, stage 1: Secondary | ICD-10-CM | POA: Diagnosis not present

## 2018-11-11 DIAGNOSIS — E1169 Type 2 diabetes mellitus with other specified complication: Secondary | ICD-10-CM | POA: Diagnosis not present

## 2018-11-17 DIAGNOSIS — G309 Alzheimer's disease, unspecified: Secondary | ICD-10-CM | POA: Diagnosis not present

## 2018-11-18 DIAGNOSIS — G309 Alzheimer's disease, unspecified: Secondary | ICD-10-CM | POA: Diagnosis not present

## 2018-11-19 DIAGNOSIS — G309 Alzheimer's disease, unspecified: Secondary | ICD-10-CM | POA: Diagnosis not present

## 2018-11-20 DIAGNOSIS — G309 Alzheimer's disease, unspecified: Secondary | ICD-10-CM | POA: Diagnosis not present

## 2018-11-21 DIAGNOSIS — I872 Venous insufficiency (chronic) (peripheral): Secondary | ICD-10-CM | POA: Diagnosis not present

## 2018-11-21 DIAGNOSIS — L97322 Non-pressure chronic ulcer of left ankle with fat layer exposed: Secondary | ICD-10-CM | POA: Diagnosis not present

## 2018-11-21 DIAGNOSIS — E11621 Type 2 diabetes mellitus with foot ulcer: Secondary | ICD-10-CM | POA: Diagnosis not present

## 2018-11-21 DIAGNOSIS — G4700 Insomnia, unspecified: Secondary | ICD-10-CM | POA: Diagnosis not present

## 2018-11-21 DIAGNOSIS — F0280 Dementia in other diseases classified elsewhere without behavioral disturbance: Secondary | ICD-10-CM | POA: Diagnosis not present

## 2018-11-21 DIAGNOSIS — S91312D Laceration without foreign body, left foot, subsequent encounter: Secondary | ICD-10-CM | POA: Diagnosis not present

## 2018-11-21 DIAGNOSIS — F419 Anxiety disorder, unspecified: Secondary | ICD-10-CM | POA: Diagnosis not present

## 2018-11-21 DIAGNOSIS — G309 Alzheimer's disease, unspecified: Secondary | ICD-10-CM | POA: Diagnosis not present

## 2018-11-21 DIAGNOSIS — G301 Alzheimer's disease with late onset: Secondary | ICD-10-CM | POA: Diagnosis not present

## 2018-11-21 DIAGNOSIS — E1151 Type 2 diabetes mellitus with diabetic peripheral angiopathy without gangrene: Secondary | ICD-10-CM | POA: Diagnosis not present

## 2018-11-22 DIAGNOSIS — G309 Alzheimer's disease, unspecified: Secondary | ICD-10-CM | POA: Diagnosis not present

## 2018-11-23 DIAGNOSIS — G309 Alzheimer's disease, unspecified: Secondary | ICD-10-CM | POA: Diagnosis not present

## 2018-11-24 DIAGNOSIS — G309 Alzheimer's disease, unspecified: Secondary | ICD-10-CM | POA: Diagnosis not present

## 2018-11-25 DIAGNOSIS — S91312D Laceration without foreign body, left foot, subsequent encounter: Secondary | ICD-10-CM | POA: Diagnosis not present

## 2018-11-25 DIAGNOSIS — F419 Anxiety disorder, unspecified: Secondary | ICD-10-CM | POA: Diagnosis not present

## 2018-11-25 DIAGNOSIS — G309 Alzheimer's disease, unspecified: Secondary | ICD-10-CM | POA: Diagnosis not present

## 2018-11-25 DIAGNOSIS — L97322 Non-pressure chronic ulcer of left ankle with fat layer exposed: Secondary | ICD-10-CM | POA: Diagnosis not present

## 2018-11-25 DIAGNOSIS — G301 Alzheimer's disease with late onset: Secondary | ICD-10-CM | POA: Diagnosis not present

## 2018-11-25 DIAGNOSIS — E11621 Type 2 diabetes mellitus with foot ulcer: Secondary | ICD-10-CM | POA: Diagnosis not present

## 2018-11-25 DIAGNOSIS — F0280 Dementia in other diseases classified elsewhere without behavioral disturbance: Secondary | ICD-10-CM | POA: Diagnosis not present

## 2018-11-25 DIAGNOSIS — I872 Venous insufficiency (chronic) (peripheral): Secondary | ICD-10-CM | POA: Diagnosis not present

## 2018-11-25 DIAGNOSIS — E1151 Type 2 diabetes mellitus with diabetic peripheral angiopathy without gangrene: Secondary | ICD-10-CM | POA: Diagnosis not present

## 2018-11-25 DIAGNOSIS — G4700 Insomnia, unspecified: Secondary | ICD-10-CM | POA: Diagnosis not present

## 2018-11-26 DIAGNOSIS — E119 Type 2 diabetes mellitus without complications: Secondary | ICD-10-CM | POA: Diagnosis not present

## 2018-11-26 DIAGNOSIS — N189 Chronic kidney disease, unspecified: Secondary | ICD-10-CM | POA: Diagnosis not present

## 2018-11-26 DIAGNOSIS — G309 Alzheimer's disease, unspecified: Secondary | ICD-10-CM | POA: Diagnosis not present

## 2018-11-27 DIAGNOSIS — G309 Alzheimer's disease, unspecified: Secondary | ICD-10-CM | POA: Diagnosis not present

## 2018-11-27 DIAGNOSIS — E1151 Type 2 diabetes mellitus with diabetic peripheral angiopathy without gangrene: Secondary | ICD-10-CM | POA: Diagnosis not present

## 2018-11-27 DIAGNOSIS — F419 Anxiety disorder, unspecified: Secondary | ICD-10-CM | POA: Diagnosis not present

## 2018-11-27 DIAGNOSIS — G301 Alzheimer's disease with late onset: Secondary | ICD-10-CM | POA: Diagnosis not present

## 2018-11-27 DIAGNOSIS — L97322 Non-pressure chronic ulcer of left ankle with fat layer exposed: Secondary | ICD-10-CM | POA: Diagnosis not present

## 2018-11-27 DIAGNOSIS — I872 Venous insufficiency (chronic) (peripheral): Secondary | ICD-10-CM | POA: Diagnosis not present

## 2018-11-27 DIAGNOSIS — E11621 Type 2 diabetes mellitus with foot ulcer: Secondary | ICD-10-CM | POA: Diagnosis not present

## 2018-11-27 DIAGNOSIS — G4700 Insomnia, unspecified: Secondary | ICD-10-CM | POA: Diagnosis not present

## 2018-11-27 DIAGNOSIS — S91312D Laceration without foreign body, left foot, subsequent encounter: Secondary | ICD-10-CM | POA: Diagnosis not present

## 2018-11-27 DIAGNOSIS — F0280 Dementia in other diseases classified elsewhere without behavioral disturbance: Secondary | ICD-10-CM | POA: Diagnosis not present

## 2018-11-28 DIAGNOSIS — G309 Alzheimer's disease, unspecified: Secondary | ICD-10-CM | POA: Diagnosis not present

## 2018-11-29 DIAGNOSIS — G309 Alzheimer's disease, unspecified: Secondary | ICD-10-CM | POA: Diagnosis not present

## 2018-11-30 DIAGNOSIS — G309 Alzheimer's disease, unspecified: Secondary | ICD-10-CM | POA: Diagnosis not present

## 2018-12-01 DIAGNOSIS — E1151 Type 2 diabetes mellitus with diabetic peripheral angiopathy without gangrene: Secondary | ICD-10-CM | POA: Diagnosis not present

## 2018-12-01 DIAGNOSIS — I872 Venous insufficiency (chronic) (peripheral): Secondary | ICD-10-CM | POA: Diagnosis not present

## 2018-12-01 DIAGNOSIS — G4700 Insomnia, unspecified: Secondary | ICD-10-CM | POA: Diagnosis not present

## 2018-12-01 DIAGNOSIS — F0280 Dementia in other diseases classified elsewhere without behavioral disturbance: Secondary | ICD-10-CM | POA: Diagnosis not present

## 2018-12-01 DIAGNOSIS — L97322 Non-pressure chronic ulcer of left ankle with fat layer exposed: Secondary | ICD-10-CM | POA: Diagnosis not present

## 2018-12-01 DIAGNOSIS — E11621 Type 2 diabetes mellitus with foot ulcer: Secondary | ICD-10-CM | POA: Diagnosis not present

## 2018-12-01 DIAGNOSIS — F419 Anxiety disorder, unspecified: Secondary | ICD-10-CM | POA: Diagnosis not present

## 2018-12-01 DIAGNOSIS — S91312D Laceration without foreign body, left foot, subsequent encounter: Secondary | ICD-10-CM | POA: Diagnosis not present

## 2018-12-01 DIAGNOSIS — E1169 Type 2 diabetes mellitus with other specified complication: Secondary | ICD-10-CM | POA: Diagnosis not present

## 2018-12-01 DIAGNOSIS — F0391 Unspecified dementia with behavioral disturbance: Secondary | ICD-10-CM | POA: Diagnosis not present

## 2018-12-01 DIAGNOSIS — Z87440 Personal history of urinary (tract) infections: Secondary | ICD-10-CM | POA: Diagnosis not present

## 2018-12-01 DIAGNOSIS — G301 Alzheimer's disease with late onset: Secondary | ICD-10-CM | POA: Diagnosis not present

## 2018-12-04 DIAGNOSIS — S91312D Laceration without foreign body, left foot, subsequent encounter: Secondary | ICD-10-CM | POA: Diagnosis not present

## 2018-12-04 DIAGNOSIS — G4700 Insomnia, unspecified: Secondary | ICD-10-CM | POA: Diagnosis not present

## 2018-12-04 DIAGNOSIS — E1151 Type 2 diabetes mellitus with diabetic peripheral angiopathy without gangrene: Secondary | ICD-10-CM | POA: Diagnosis not present

## 2018-12-04 DIAGNOSIS — L97322 Non-pressure chronic ulcer of left ankle with fat layer exposed: Secondary | ICD-10-CM | POA: Diagnosis not present

## 2018-12-04 DIAGNOSIS — F419 Anxiety disorder, unspecified: Secondary | ICD-10-CM | POA: Diagnosis not present

## 2018-12-04 DIAGNOSIS — G301 Alzheimer's disease with late onset: Secondary | ICD-10-CM | POA: Diagnosis not present

## 2018-12-04 DIAGNOSIS — I872 Venous insufficiency (chronic) (peripheral): Secondary | ICD-10-CM | POA: Diagnosis not present

## 2018-12-04 DIAGNOSIS — E11621 Type 2 diabetes mellitus with foot ulcer: Secondary | ICD-10-CM | POA: Diagnosis not present

## 2018-12-04 DIAGNOSIS — F0280 Dementia in other diseases classified elsewhere without behavioral disturbance: Secondary | ICD-10-CM | POA: Diagnosis not present

## 2018-12-11 DIAGNOSIS — F419 Anxiety disorder, unspecified: Secondary | ICD-10-CM | POA: Diagnosis not present

## 2018-12-11 DIAGNOSIS — F0280 Dementia in other diseases classified elsewhere without behavioral disturbance: Secondary | ICD-10-CM | POA: Diagnosis not present

## 2018-12-11 DIAGNOSIS — G4700 Insomnia, unspecified: Secondary | ICD-10-CM | POA: Diagnosis not present

## 2018-12-11 DIAGNOSIS — G301 Alzheimer's disease with late onset: Secondary | ICD-10-CM | POA: Diagnosis not present

## 2018-12-11 DIAGNOSIS — E1151 Type 2 diabetes mellitus with diabetic peripheral angiopathy without gangrene: Secondary | ICD-10-CM | POA: Diagnosis not present

## 2018-12-11 DIAGNOSIS — E11621 Type 2 diabetes mellitus with foot ulcer: Secondary | ICD-10-CM | POA: Diagnosis not present

## 2018-12-11 DIAGNOSIS — L97322 Non-pressure chronic ulcer of left ankle with fat layer exposed: Secondary | ICD-10-CM | POA: Diagnosis not present

## 2018-12-11 DIAGNOSIS — I872 Venous insufficiency (chronic) (peripheral): Secondary | ICD-10-CM | POA: Diagnosis not present

## 2018-12-11 DIAGNOSIS — S91312D Laceration without foreign body, left foot, subsequent encounter: Secondary | ICD-10-CM | POA: Diagnosis not present

## 2018-12-16 DIAGNOSIS — E1151 Type 2 diabetes mellitus with diabetic peripheral angiopathy without gangrene: Secondary | ICD-10-CM | POA: Diagnosis not present

## 2018-12-16 DIAGNOSIS — E11621 Type 2 diabetes mellitus with foot ulcer: Secondary | ICD-10-CM | POA: Diagnosis not present

## 2018-12-16 DIAGNOSIS — F419 Anxiety disorder, unspecified: Secondary | ICD-10-CM | POA: Diagnosis not present

## 2018-12-16 DIAGNOSIS — F0280 Dementia in other diseases classified elsewhere without behavioral disturbance: Secondary | ICD-10-CM | POA: Diagnosis not present

## 2018-12-16 DIAGNOSIS — G4700 Insomnia, unspecified: Secondary | ICD-10-CM | POA: Diagnosis not present

## 2018-12-16 DIAGNOSIS — G301 Alzheimer's disease with late onset: Secondary | ICD-10-CM | POA: Diagnosis not present

## 2018-12-16 DIAGNOSIS — I872 Venous insufficiency (chronic) (peripheral): Secondary | ICD-10-CM | POA: Diagnosis not present

## 2018-12-16 DIAGNOSIS — L97322 Non-pressure chronic ulcer of left ankle with fat layer exposed: Secondary | ICD-10-CM | POA: Diagnosis not present

## 2018-12-16 DIAGNOSIS — S91312D Laceration without foreign body, left foot, subsequent encounter: Secondary | ICD-10-CM | POA: Diagnosis not present

## 2018-12-17 DIAGNOSIS — G309 Alzheimer's disease, unspecified: Secondary | ICD-10-CM | POA: Diagnosis not present

## 2018-12-18 DIAGNOSIS — Z0189 Encounter for other specified special examinations: Secondary | ICD-10-CM | POA: Diagnosis not present

## 2018-12-18 DIAGNOSIS — G309 Alzheimer's disease, unspecified: Secondary | ICD-10-CM | POA: Diagnosis not present

## 2018-12-19 ENCOUNTER — Emergency Department (HOSPITAL_COMMUNITY): Payer: Medicare HMO

## 2018-12-19 ENCOUNTER — Inpatient Hospital Stay (HOSPITAL_COMMUNITY)
Admission: EM | Admit: 2018-12-19 | Discharge: 2018-12-26 | DRG: 177 | Disposition: A | Discharge status: Skilled Nursing Facility | Payer: Medicare HMO | Source: Skilled Nursing Facility | Attending: Internal Medicine | Admitting: Internal Medicine

## 2018-12-19 ENCOUNTER — Encounter (HOSPITAL_COMMUNITY): Payer: Self-pay | Admitting: Emergency Medicine

## 2018-12-19 DIAGNOSIS — E119 Type 2 diabetes mellitus without complications: Secondary | ICD-10-CM

## 2018-12-19 DIAGNOSIS — I2510 Atherosclerotic heart disease of native coronary artery without angina pectoris: Secondary | ICD-10-CM | POA: Diagnosis present

## 2018-12-19 DIAGNOSIS — Z951 Presence of aortocoronary bypass graft: Secondary | ICD-10-CM

## 2018-12-19 DIAGNOSIS — Z7982 Long term (current) use of aspirin: Secondary | ICD-10-CM | POA: Diagnosis not present

## 2018-12-19 DIAGNOSIS — M2550 Pain in unspecified joint: Secondary | ICD-10-CM | POA: Diagnosis not present

## 2018-12-19 DIAGNOSIS — J9601 Acute respiratory failure with hypoxia: Secondary | ICD-10-CM | POA: Diagnosis present

## 2018-12-19 DIAGNOSIS — D649 Anemia, unspecified: Secondary | ICD-10-CM | POA: Diagnosis present

## 2018-12-19 DIAGNOSIS — H401133 Primary open-angle glaucoma, bilateral, severe stage: Secondary | ICD-10-CM | POA: Diagnosis present

## 2018-12-19 DIAGNOSIS — E871 Hypo-osmolality and hyponatremia: Secondary | ICD-10-CM | POA: Diagnosis not present

## 2018-12-19 DIAGNOSIS — I10 Essential (primary) hypertension: Secondary | ICD-10-CM | POA: Diagnosis present

## 2018-12-19 DIAGNOSIS — E860 Dehydration: Secondary | ICD-10-CM | POA: Diagnosis present

## 2018-12-19 DIAGNOSIS — N170 Acute kidney failure with tubular necrosis: Secondary | ICD-10-CM | POA: Diagnosis present

## 2018-12-19 DIAGNOSIS — R509 Fever, unspecified: Secondary | ICD-10-CM | POA: Diagnosis not present

## 2018-12-19 DIAGNOSIS — Z8673 Personal history of transient ischemic attack (TIA), and cerebral infarction without residual deficits: Secondary | ICD-10-CM

## 2018-12-19 DIAGNOSIS — Z794 Long term (current) use of insulin: Secondary | ICD-10-CM

## 2018-12-19 DIAGNOSIS — E7800 Pure hypercholesterolemia, unspecified: Secondary | ICD-10-CM | POA: Diagnosis present

## 2018-12-19 DIAGNOSIS — J189 Pneumonia, unspecified organism: Secondary | ICD-10-CM | POA: Diagnosis not present

## 2018-12-19 DIAGNOSIS — D638 Anemia in other chronic diseases classified elsewhere: Secondary | ICD-10-CM | POA: Diagnosis not present

## 2018-12-19 DIAGNOSIS — R404 Transient alteration of awareness: Secondary | ICD-10-CM | POA: Diagnosis not present

## 2018-12-19 DIAGNOSIS — R278 Other lack of coordination: Secondary | ICD-10-CM | POA: Diagnosis not present

## 2018-12-19 DIAGNOSIS — R195 Other fecal abnormalities: Secondary | ICD-10-CM | POA: Diagnosis not present

## 2018-12-19 DIAGNOSIS — U071 COVID-19: Principal | ICD-10-CM | POA: Diagnosis present

## 2018-12-19 DIAGNOSIS — M6281 Muscle weakness (generalized): Secondary | ICD-10-CM | POA: Diagnosis not present

## 2018-12-19 DIAGNOSIS — Z79899 Other long term (current) drug therapy: Secondary | ICD-10-CM | POA: Diagnosis not present

## 2018-12-19 DIAGNOSIS — E861 Hypovolemia: Secondary | ICD-10-CM | POA: Diagnosis present

## 2018-12-19 DIAGNOSIS — E1169 Type 2 diabetes mellitus with other specified complication: Secondary | ICD-10-CM | POA: Diagnosis not present

## 2018-12-19 DIAGNOSIS — F0280 Dementia in other diseases classified elsewhere without behavioral disturbance: Secondary | ICD-10-CM | POA: Diagnosis not present

## 2018-12-19 DIAGNOSIS — Z7401 Bed confinement status: Secondary | ICD-10-CM | POA: Diagnosis not present

## 2018-12-19 DIAGNOSIS — I959 Hypotension, unspecified: Secondary | ICD-10-CM | POA: Diagnosis not present

## 2018-12-19 DIAGNOSIS — A0839 Other viral enteritis: Secondary | ICD-10-CM | POA: Diagnosis not present

## 2018-12-19 DIAGNOSIS — N289 Disorder of kidney and ureter, unspecified: Secondary | ICD-10-CM

## 2018-12-19 DIAGNOSIS — J90 Pleural effusion, not elsewhere classified: Secondary | ICD-10-CM | POA: Diagnosis not present

## 2018-12-19 DIAGNOSIS — I700 Atherosclerosis of aorta: Secondary | ICD-10-CM | POA: Diagnosis not present

## 2018-12-19 DIAGNOSIS — J1289 Other viral pneumonia: Secondary | ICD-10-CM | POA: Diagnosis present

## 2018-12-19 DIAGNOSIS — R0902 Hypoxemia: Secondary | ICD-10-CM | POA: Diagnosis not present

## 2018-12-19 DIAGNOSIS — J129 Viral pneumonia, unspecified: Secondary | ICD-10-CM | POA: Diagnosis not present

## 2018-12-19 DIAGNOSIS — Z209 Contact with and (suspected) exposure to unspecified communicable disease: Secondary | ICD-10-CM | POA: Diagnosis not present

## 2018-12-19 DIAGNOSIS — R2689 Other abnormalities of gait and mobility: Secondary | ICD-10-CM | POA: Diagnosis not present

## 2018-12-19 DIAGNOSIS — G309 Alzheimer's disease, unspecified: Secondary | ICD-10-CM | POA: Diagnosis not present

## 2018-12-19 DIAGNOSIS — R2681 Unsteadiness on feet: Secondary | ICD-10-CM | POA: Diagnosis not present

## 2018-12-19 DIAGNOSIS — R41841 Cognitive communication deficit: Secondary | ICD-10-CM | POA: Diagnosis not present

## 2018-12-19 DIAGNOSIS — R918 Other nonspecific abnormal finding of lung field: Secondary | ICD-10-CM | POA: Diagnosis not present

## 2018-12-19 DIAGNOSIS — K8020 Calculus of gallbladder without cholecystitis without obstruction: Secondary | ICD-10-CM | POA: Diagnosis not present

## 2018-12-19 LAB — COMPREHENSIVE METABOLIC PANEL
ALT: 27 U/L (ref 0–44)
AST: 33 U/L (ref 15–41)
Albumin: 3.3 g/dL — ABNORMAL LOW (ref 3.5–5.0)
Alkaline Phosphatase: 53 U/L (ref 38–126)
Anion gap: 9 (ref 5–15)
BUN: 25 mg/dL — ABNORMAL HIGH (ref 8–23)
CO2: 24 mmol/L (ref 22–32)
Calcium: 8.8 mg/dL — ABNORMAL LOW (ref 8.9–10.3)
Chloride: 100 mmol/L (ref 98–111)
Creatinine, Ser: 1.55 mg/dL — ABNORMAL HIGH (ref 0.61–1.24)
GFR calc Af Amer: 51 mL/min — ABNORMAL LOW (ref >60)
GFR calc non Af Amer: 44 mL/min — ABNORMAL LOW (ref >60)
Glucose, Bld: 87 mg/dL (ref 70–99)
Potassium: 5.1 mmol/L (ref 3.5–5.1)
Sodium: 133 mmol/L — ABNORMAL LOW (ref 135–145)
Total Bilirubin: 0.7 mg/dL (ref 0.3–1.2)
Total Protein: 6.7 g/dL (ref 6.5–8.1)

## 2018-12-19 LAB — CBC WITH DIFFERENTIAL/PLATELET
Abs Immature Granulocytes: 0.03 10*3/uL (ref 0.00–0.07)
Basophils Absolute: 0.1 10*3/uL (ref 0.0–0.1)
Basophils Relative: 1 %
Eosinophils Absolute: 0 10*3/uL (ref 0.0–0.5)
Eosinophils Relative: 0 %
HCT: 27.6 % — ABNORMAL LOW (ref 39.0–52.0)
Hemoglobin: 8.9 g/dL — ABNORMAL LOW (ref 13.0–17.0)
Immature Granulocytes: 0 %
Lymphocytes Relative: 26 %
Lymphs Abs: 2.8 10*3/uL (ref 0.7–4.0)
MCH: 30.9 pg (ref 26.0–34.0)
MCHC: 32.2 g/dL (ref 30.0–36.0)
MCV: 95.8 fL (ref 80.0–100.0)
Monocytes Absolute: 1.8 10*3/uL — ABNORMAL HIGH (ref 0.1–1.0)
Monocytes Relative: 17 %
Neutro Abs: 5.9 10*3/uL (ref 1.7–7.7)
Neutrophils Relative %: 56 %
Platelets: 179 10*3/uL (ref 150–400)
RBC: 2.88 MIL/uL — ABNORMAL LOW (ref 4.22–5.81)
RDW: 12.5 % (ref 11.5–15.5)
WBC: 10.7 10*3/uL — ABNORMAL HIGH (ref 4.0–10.5)
nRBC: 0 % (ref 0.0–0.2)

## 2018-12-19 LAB — LACTIC ACID, PLASMA: Lactic Acid, Venous: 1.7 mmol/L (ref 0.5–1.9)

## 2018-12-19 LAB — PROTIME-INR
INR: 1.1 (ref 0.8–1.2)
Prothrombin Time: 13.8 seconds (ref 11.4–15.2)

## 2018-12-19 LAB — SARS CORONAVIRUS 2 BY RT PCR (HOSPITAL ORDER, PERFORMED IN ~~LOC~~ HOSPITAL LAB): SARS Coronavirus 2: POSITIVE — AB

## 2018-12-19 MED ORDER — ACETAMINOPHEN 500 MG PO TABS
1000.0000 mg | ORAL_TABLET | Freq: Once | ORAL | Status: AC
  Administered 2018-12-19: 1000 mg via ORAL
  Filled 2018-12-19: qty 2

## 2018-12-19 MED ORDER — SODIUM CHLORIDE 0.9% FLUSH
3.0000 mL | Freq: Once | INTRAVENOUS | Status: AC
  Administered 2018-12-19: 3 mL via INTRAVENOUS

## 2018-12-19 MED ORDER — SODIUM CHLORIDE 0.9 % IV BOLUS
1000.0000 mL | Freq: Once | INTRAVENOUS | Status: AC
  Administered 2018-12-19: 1000 mL via INTRAVENOUS

## 2018-12-19 NOTE — ED Triage Notes (Signed)
Pt from [place] by EMS with fever of 103F. EMS VS 93% on room air, BP 126/64, P 66, CBG 110. Pt denied any SOB. Pt now on 2L O2 and is now at 100%. Pt has no complaints at this time. 650mg  Tylenol given at 1900 tonight by facility. Temp in triage 100F. Hx of dementia at baseline, ambulates with walker.

## 2018-12-19 NOTE — ED Notes (Signed)
ED Provider at bedside. 

## 2018-12-19 NOTE — ED Notes (Signed)
X-ray at bedside

## 2018-12-19 NOTE — ED Provider Notes (Signed)
[facility] EMERGENCY DEPARTMENT Provider Note   CSN: [id number] Arrival date & time: 12/19/18  2120    History   Chief Complaint Chief Complaint  Patient presents with  . Fever    HPI [name] is a 73 y.o. male past history of CAD, diabetes, dementia, hypertension brought in by EMS from [place] for evaluation of fever that began tonight.  Per EMS report, the staff at [place] checked patient's temperature today and noted it was 103.  [place] reports that he was not having any complaints.  On my evaluation, he says "my stomach's been upset and I feel like I have to barf."  He does not know how long this is been going on.  On initial EMS arrival, he had O2 sats of 93% on room air.  He was placed on 2 L O2 for comfort care and was increased to 100%.  Additionally, he got 650 mg of Tylenol at approximately 1900 tonight by [employer].  EM LEVEL 5 CAVEAT DUE TO DEMENTIA     The history is provided by the EMS personnel.    Past Medical History:  Diagnosis Date  . Coronary artery disease   . Diabetes mellitus    type II  . Gastritis   . Glaucoma    hx of  . Hypercholesterolemia   . Hypertension   . Insomnia   . Stroke [facility])    hx of multiple    Patient Active Problem List   Diagnosis Date Noted  . Occult GI bleeding 12/20/2018  . Gastroenteritis due to COVID-19 virus 12/20/2018  . Renal insufficiency 12/20/2018  . Normocytic anemia 12/20/2018  . Alzheimer's dementia ([place]) 12/20/2018  . Primary open angle glaucoma (POAG) of both eyes, severe stage 04/28/2018  . Pseudophakia of both eyes 04/28/2018  . CORONARY ARTERY BYPASS GRAFT, HX OF 05/25/2006  . Type II diabetes mellitus ([place]) 04/13/2006  . HYPERCHOLESTEROLEMIA 04/13/2006  . INCREASED INTRAOCULAR PRESSURE 04/13/2006  . HYPERTENSION 04/13/2006  . CORONARY ARTERY DISEASE 04/13/2006  . INSOMNIA 04/13/2006  . EPIGASTRIC PAIN 04/13/2006  . History of CVA (cerebrovascular  accident) 04/13/2006  . GLAUCOMA, HX OF 04/13/2006    Past Surgical History:  Procedure Laterality Date  . CATARACT EXTRACTION    . CORONARY ARTERY BYPASS GRAFT  2006   LIMA to LAD; SVG gto diagonal; SVG to ramus/L PLSA)        Home Medications    Prior to Admission medications   Medication Sig Start Date End Date Taking? Authorizing Provider  acetaminophen (TYLENOL) 325 MG tablet Take 650 mg by mouth 2 (two) times daily.    [provider]  Acetaminophen 325 MG CAPS Take 650 mg by mouth every 4 (four) hours as needed for mild pain.    [provider]  aspirin 81 MG chewable tablet Chew 81 mg by mouth daily.     [provider]  atorvastatin (LIPITOR) 40 MG tablet Take 40 mg by mouth at bedtime.     [provider]  carvedilol (COREG) 3.125 MG tablet Take 3.125 mg by mouth 2 (two) times daily.  02/27/18   [provider]  doxepin (SINEQUAN) 10 MG capsule Take 10 mg by mouth at bedtime.  02/27/18   [provider]  escitalopram (LEXAPRO) 10 MG tablet Take 10 mg by mouth daily.  02/27/18   [provider]  ferrous gluconate (FERGON) 324 MG tablet Take 324 mg by mouth daily.    [provider]  furosemide (LASIX) 40 MG tablet Take 40 mg by mouth daily.     [provider]  insulin glargine (LANTUS) 100 UNIT/ML injection Inject 5-19 Units into the skin See admin instructions. Inject 14 units every morning and 5 units every night at bedtime.    [provider]  lisinopril (PRINIVIL,ZESTRIL) 5 MG tablet Take 5 mg by mouth daily.  02/27/18   [provider]  metFORMIN (GLUCOPHAGE) 1000 MG tablet Take 1,000 mg by mouth 2 (two) times daily.  03/04/18   [provider]    Family History Family History  Problem Relation Age of Onset  . Diabetes Father 32  . Other Mother 17       alive    Social History Social History   Tobacco Use  . Smoking status: Never Smoker  . Smokeless  tobacco: Never Used  Substance Use Topics  . Alcohol use: Never    Frequency: Never  . Drug use: Never     Allergies   Patient has no known allergies.   Review of Systems Review of Systems  Unable to perform ROS: Dementia     Physical Exam Updated Vital Signs BP (!) 155/69   Pulse (!) 55   Temp 97.6 F (36.4 C) (Oral)   Resp 18   Ht 5\' 11"  (1.803 m)   Wt 77.1 kg   SpO2 100%   BMI 23.71 kg/m   Physical Exam Vitals signs and nursing note reviewed. Exam conducted with a chaperone present.  Constitutional:      Appearance: Normal appearance. He is well-developed.  HENT:     Head: Normocephalic and atraumatic.  Eyes:     General: Lids are normal.     Conjunctiva/sclera: Conjunctivae normal.     Pupils: Pupils are equal, round, and reactive to light.  Neck:     Musculoskeletal: Full passive range of motion without pain.  Cardiovascular:     Rate and Rhythm: Normal rate and regular rhythm.     Pulses:          Radial pulses are 2+ on the right side and 2+ on the left side.       Dorsalis pedis pulses are detected w/ Doppler on the right side and detected w/ Doppler on the left side.     Heart sounds: Normal heart sounds. No murmur. No friction rub. No gallop.      Comments: Difficulty assessing palpable DP pulses bilaterally but easily obtainable with Doppler. Pulmonary:     Effort: Pulmonary effort is normal.     Breath sounds: Normal breath sounds.     Comments: Lungs clear to auscultation bilaterally.  Symmetric chest rise.  No wheezing, rales, rhonchi. Abdominal:     Palpations: Abdomen is soft. Abdomen is not rigid.     Tenderness: There is no abdominal tenderness. There is no guarding.     Comments: Abdomen is soft, nondistended.  No rigidity, guarding.  No noted tenderness.  Genitourinary:    Penis: Normal.      Scrotum/Testes:        Right: Tenderness or swelling not present.        Left: Tenderness or swelling not present.     Comments: The exam was  performed with a chaperone present. Normal male genitalia. No evidence of rash, ulcers or lesions.  Dark brown stool noted on digital rectal exam.  No gross melena.  No bright red blood. Musculoskeletal: Normal range of motion.  Skin:  General: Skin is warm and dry.     Capillary Refill: Capillary refill takes less than 2 seconds.     Findings: No rash.  Neurological:     Mental Status: He is alert.     Comments: Alert and oriented x1  Follows commands Answers some questions appropriately   Psychiatric:        Speech: Speech normal.      ED Treatments / Results  Labs (all labs ordered are listed, but only abnormal results are displayed) Labs Reviewed  SARS CORONAVIRUS 2 ([facility]) - Abnormal; Notable for the following components:      Result Value   SARS Coronavirus 2 POSITIVE (*)    All other components within normal limits  COMPREHENSIVE METABOLIC PANEL - Abnormal; Notable for the following components:   Sodium 133 (*)    BUN 25 (*)    Creatinine, Ser 1.55 (*)    Calcium 8.8 (*)    Albumin 3.3 (*)    GFR calc non Af Amer 44 (*)    GFR calc Af Amer 51 (*)    All other components within normal limits  CBC WITH DIFFERENTIAL/PLATELET - Abnormal; Notable for the following components:   WBC 10.7 (*)    RBC 2.88 (*)    Hemoglobin 8.9 (*)    HCT 27.6 (*)    Monocytes Absolute 1.8 (*)    All other components within normal limits  URINALYSIS, ROUTINE W REFLEX MICROSCOPIC - Abnormal; Notable for the following components:   Protein, ur 100 (*)    All other components within normal limits  HEMOGLOBIN A1C - Abnormal; Notable for the following components:   Hgb A1c MFr Bld 6.7 (*)    All other components within normal limits  C-REACTIVE PROTEIN - Abnormal; Notable for the following components:   CRP 1.1 (*)    All other components within normal limits  D-DIMER, QUANTITATIVE (NOT AT [facility]) - Abnormal; Notable for the following  components:   D-Dimer, Quant 1.63 (*)    All other components within normal limits  LACTATE DEHYDROGENASE - Abnormal; Notable for the following components:   LDH 221 (*)    All other components within normal limits  CBG MONITORING, ED - Abnormal; Notable for the following components:   Glucose-Capillary 65 (*)    All other components within normal limits  CBG MONITORING, ED - Abnormal; Notable for the following components:   Glucose-Capillary 153 (*)    All other components within normal limits  CULTURE, BLOOD (ROUTINE X 2)  CULTURE, BLOOD (ROUTINE X 2)  EXPECTORATED SPUTUM ASSESSMENT W REFEX TO RESP CULTURE  LACTIC ACID, PLASMA  LACTIC ACID, PLASMA  PROTIME-INR  LIPASE, BLOOD  FIBRINOGEN  PROCALCITONIN  INTERLEUKIN-6, PLASMA  POC OCCULT BLOOD, ED  TYPE AND SCREEN  ABO/RH    EKG EKG Interpretation  Date/Time:  Friday December 19 2018 21:24:30 EDT Ventricular Rate:  60 PR Interval:    QRS Duration: 102 QT Interval:  424 QTC Calculation: 424 R Axis:   71 Text Interpretation:  Sinus rhythm Low voltage with right axis deviation Consider anterior infarct When compared with ECG of 03/31/2018, Premature atrial complexes are no longer present Nonspecific T wave abnormality is no longer present Confirmed by Delora Fuel ([id number]) on 12/19/2018 11:40:38 PM   Radiology Ct Abdomen Pelvis Wo Contrast  Result Date: 12/20/2018 CLINICAL DATA:  Acute abdominal pain. COVID-19 positive. EXAM: CT ABDOMEN AND PELVIS WITHOUT CONTRAST TECHNIQUE: Multidetector CT imaging of  the abdomen and pelvis was performed following the standard protocol without IV contrast. COMPARISON:  CT 04/28/2007 FINDINGS: Lower chest: Trace right pleural effusion. Right greater than left lower lobe atelectasis. There are coronary artery calcifications. Hepatobiliary: Multiple gallstones within physiologically distended gallbladder. No definite pericholecystic inflammation. No biliary dilatation. Probable hepatic arterial  calcifications. No focal lesion on noncontrast exam. Pancreas: No ductal dilatation or inflammation. Spleen: Normal in size without focal abnormality. Adrenals/Urinary Tract: Small left adrenal adenoma. No right adrenal nodule. Symmetric bilateral perinephric edema. No hydronephrosis. No urolithiasis. Urinary bladder is physiologically distended. No bladder wall thickening. Stomach/Bowel: Small hiatal hernia. Gaseous gastric distension without wall thickening. Air-fluid levels within slightly distended distal small bowel, no definite wall thickening or adjacent inflammation. Fluid/liquid stool throughout the colon with few scattered air-fluid levels. Minor distal colonic diverticulosis without diverticulitis. Formed stool in the rectosigmoid colon with moderate rectosigmoid wall thickening. Mild adjacent perirectal stranding. Normal appendix tentatively visualized. Vascular/Lymphatic: Advanced aortic and branch atherosclerosis. No aneurysm. No bulky abdominopelvic adenopathy. Reproductive: Prominent prostate gland spanning 5.2 cm transverse. Other: No free air. No significant free fluid. Musculoskeletal: Bones are under mineralized. Rule out bilateral inferior pubic rami fractures. Remote right superior pubic ramus fracture. There are no acute or suspicious osseous abnormalities. IMPRESSION: 1. Fluid-filled colon and small bowel with air-fluid levels, as well as gaseous gastric distension, consistent with gastroenteritis/diarrheal process. No obstruction. 2. Rectosigmoid colonic wall thickening with mild pericolonic edema, likely focal proctocolitis. 3. Cholelithiasis without gallbladder inflammation. 4. Advanced aortic and branch atherosclerosis. Aortic Atherosclerosis (ICD10-I70.0). 5. Trace right pleural effusion. Electronically Signed   By: Keith Rake M.D.   On: 12/20/2018 00:39   Dg Chest Portable 1 View  Result Date: 12/19/2018 CLINICAL DATA:  Suspected sepsis. EXAM: PORTABLE CHEST 1 VIEW COMPARISON:   March 31, 2018 FINDINGS: Probable skin fold over the right lung base. Stable cardiomegaly. The hila and mediastinum are unremarkable. No pneumothorax. The left lung is clear. Subtle opacity in the left mid lower lung is identified. No other acute abnormalities. IMPRESSION: Subtle opacity in the right mid lower lung may represent atelectasis or developing infiltrate. No other acute abnormalities. Electronically Signed   By: Dorise Bullion III M.D   On: 12/19/2018 22:45    Procedures Procedures (including critical care time)  Medications Ordered in ED Medications  pantoprazole (PROTONIX) injection 40 mg (has no administration in time range)  insulin aspart (novoLOG) injection 0-9 Units (0 Units Subcutaneous Not Given 12/20/18 0415)  ondansetron (ZOFRAN) tablet 4 mg (has no administration in time range)    Or  ondansetron (ZOFRAN) injection 4 mg (has no administration in time range)  acetaminophen (TYLENOL) tablet 650 mg (has no administration in time range)    Or  acetaminophen (TYLENOL) suppository 650 mg (has no administration in time range)  dextrose 5 % solution ( Intravenous New Bag/Given 12/20/18 0541)  sodium chloride flush (NS) 0.9 % injection 3 mL (3 mLs Intravenous Given 12/19/18 2238)  acetaminophen (TYLENOL) tablet 1,000 mg (1,000 mg Oral Given 12/19/18 2244)  sodium chloride 0.9 % bolus 1,000 mL (0 mLs Intravenous Stopped 12/19/18 2340)  cefTRIAXone (ROCEPHIN) 1 g in sodium chloride 0.9 % 100 mL IVPB (0 g Intravenous Stopped 12/20/18 0241)  azithromycin (ZITHROMAX) 500 mg in sodium chloride 0.9 % 250 mL IVPB (0 mg Intravenous Stopped 12/20/18 0410)  pantoprazole (PROTONIX) injection 40 mg (40 mg Intravenous Given 12/20/18 0205)  sodium chloride 0.9 % bolus 500 mL (500 mLs Intravenous New Bag/Given 12/20/18 0303)  dextrose 50 %  solution 50 mL (50 mLs Intravenous Given 12/20/18 0501)     Initial Impression / Assessment and Plan / ED Course  I have reviewed the triage vital signs and the nursing  notes.  Pertinent labs & imaging results that were available during my care of the patient were reviewed by me and considered in my medical decision making (see chart for details).        73 y.o. M with PMH/o dementia, HTN, hyperlipidemia who presents for evaluation of fever noted by nursing home staff.  Initially arrival, he is febrile.  Blood pressure is soft at 110/56.  Vitals otherwise stable.  Abdomen is soft, nondistended.  He does not really exhibit any tenderness but given his complaints of his belly not feeling right" will plan for evaluation.  Plan to check labs, urine, COVID.  Patient is COVID positive.  Lactic acid is normal.  CMP shows slight bump in BUN and creatinine at 25 and 1.55.  His baseline is between 1 and 1.26.  CBC shows leukocytosis of 10.7, hemoglobin of 8.9.  His baseline hemoglobin is between 10-11.  Chest x-ray shows questionable new infiltrate in the right lung.  Given fever and leukocytosis, will plan to treat.  Patient with no known drug allergies.  Fecal occult did not cross over into epic.  I did discuss with the lab tech who said it was positive.  Given positive fecal occult as well as low hemoglobin, will plan to give a dose of Protonix for possible GI bleed.  For pneumonia noted on chest x-ray, will plan to treat.  Given COVID, possible GI bleed and pneumonia, will likely need admission.  CT scan shows fluid-filled colon and small bowel with air-fluid levels consistent with gastroenteritis versus diarrheal process.  No evidence of obstruction.  Cholelithiasis without any evidence of cholecystitis.  Discussed patient with Dr. Myna Hidalgo (hospitalist).  He accepts patient for admission.  He would like me to call GI for evaluation of placement.   Discussed patient with Dr. Loletha Carrow ([facility]).  He does not feel that patient needs emergent endoscopy or colonoscopy at this time.  He feels that it is appropriate for patient to be transferred to [facility] and if GI is  needed for further intervention, they can be consulted at that time.  Updated Dr. Myna Hidalgo on plan.  Portions of this note were generated with [employer]. Dictation errors may occur despite best attempts at proofreading.  Final Clinical Impressions(s) / ED Diagnoses   Final diagnoses:  [id number]  Community acquired pneumonia of right middle lobe of lung [facility])    ED Discharge Orders    None       Desma Mcgregor 79/02/40 [id number]    Delora Fuel, MD 32/99/24 [phone]

## 2018-12-20 ENCOUNTER — Other Ambulatory Visit: Payer: Self-pay

## 2018-12-20 ENCOUNTER — Encounter (HOSPITAL_COMMUNITY): Payer: Self-pay | Admitting: Family Medicine

## 2018-12-20 ENCOUNTER — Emergency Department (HOSPITAL_COMMUNITY): Payer: Medicare HMO

## 2018-12-20 DIAGNOSIS — E860 Dehydration: Secondary | ICD-10-CM | POA: Diagnosis present

## 2018-12-20 DIAGNOSIS — N170 Acute kidney failure with tubular necrosis: Secondary | ICD-10-CM | POA: Diagnosis present

## 2018-12-20 DIAGNOSIS — I2510 Atherosclerotic heart disease of native coronary artery without angina pectoris: Secondary | ICD-10-CM | POA: Diagnosis present

## 2018-12-20 DIAGNOSIS — F0280 Dementia in other diseases classified elsewhere without behavioral disturbance: Secondary | ICD-10-CM | POA: Diagnosis present

## 2018-12-20 DIAGNOSIS — Z794 Long term (current) use of insulin: Secondary | ICD-10-CM | POA: Diagnosis not present

## 2018-12-20 DIAGNOSIS — E871 Hypo-osmolality and hyponatremia: Secondary | ICD-10-CM | POA: Diagnosis present

## 2018-12-20 DIAGNOSIS — A0839 Other viral enteritis: Secondary | ICD-10-CM | POA: Diagnosis present

## 2018-12-20 DIAGNOSIS — U071 COVID-19: Principal | ICD-10-CM

## 2018-12-20 DIAGNOSIS — Z8673 Personal history of transient ischemic attack (TIA), and cerebral infarction without residual deficits: Secondary | ICD-10-CM

## 2018-12-20 DIAGNOSIS — N289 Disorder of kidney and ureter, unspecified: Secondary | ICD-10-CM

## 2018-12-20 DIAGNOSIS — G309 Alzheimer's disease, unspecified: Secondary | ICD-10-CM | POA: Diagnosis present

## 2018-12-20 DIAGNOSIS — D649 Anemia, unspecified: Secondary | ICD-10-CM | POA: Diagnosis not present

## 2018-12-20 DIAGNOSIS — E1169 Type 2 diabetes mellitus with other specified complication: Secondary | ICD-10-CM

## 2018-12-20 DIAGNOSIS — H401133 Primary open-angle glaucoma, bilateral, severe stage: Secondary | ICD-10-CM | POA: Diagnosis present

## 2018-12-20 DIAGNOSIS — R509 Fever, unspecified: Secondary | ICD-10-CM | POA: Diagnosis present

## 2018-12-20 DIAGNOSIS — I10 Essential (primary) hypertension: Secondary | ICD-10-CM | POA: Diagnosis present

## 2018-12-20 DIAGNOSIS — E861 Hypovolemia: Secondary | ICD-10-CM | POA: Diagnosis present

## 2018-12-20 DIAGNOSIS — R195 Other fecal abnormalities: Secondary | ICD-10-CM | POA: Diagnosis present

## 2018-12-20 DIAGNOSIS — J9601 Acute respiratory failure with hypoxia: Secondary | ICD-10-CM | POA: Diagnosis present

## 2018-12-20 DIAGNOSIS — Z79899 Other long term (current) drug therapy: Secondary | ICD-10-CM | POA: Diagnosis not present

## 2018-12-20 DIAGNOSIS — Z7982 Long term (current) use of aspirin: Secondary | ICD-10-CM | POA: Diagnosis not present

## 2018-12-20 DIAGNOSIS — D638 Anemia in other chronic diseases classified elsewhere: Secondary | ICD-10-CM | POA: Diagnosis present

## 2018-12-20 DIAGNOSIS — Z951 Presence of aortocoronary bypass graft: Secondary | ICD-10-CM | POA: Diagnosis not present

## 2018-12-20 DIAGNOSIS — E7800 Pure hypercholesterolemia, unspecified: Secondary | ICD-10-CM | POA: Diagnosis present

## 2018-12-20 DIAGNOSIS — J1289 Other viral pneumonia: Secondary | ICD-10-CM | POA: Diagnosis present

## 2018-12-20 LAB — LACTATE DEHYDROGENASE: LDH: 221 U/L — ABNORMAL HIGH (ref 98–192)

## 2018-12-20 LAB — LIPASE, BLOOD: Lipase: 32 U/L (ref 11–51)

## 2018-12-20 LAB — URINALYSIS, ROUTINE W REFLEX MICROSCOPIC
Bacteria, UA: NONE SEEN
Bilirubin Urine: NEGATIVE
Glucose, UA: NEGATIVE mg/dL
Hgb urine dipstick: NEGATIVE
Ketones, ur: NEGATIVE mg/dL
Leukocytes,Ua: NEGATIVE
Nitrite: NEGATIVE
Protein, ur: 100 mg/dL — AB
Specific Gravity, Urine: 1.014 (ref 1.005–1.030)
pH: 5 (ref 5.0–8.0)

## 2018-12-20 LAB — TYPE AND SCREEN
ABO/RH(D): O POS
Antibody Screen: NEGATIVE

## 2018-12-20 LAB — HEMOGLOBIN A1C
Hgb A1c MFr Bld: 6.7 % — ABNORMAL HIGH (ref 4.8–5.6)
Mean Plasma Glucose: 145.59 mg/dL

## 2018-12-20 LAB — CBG MONITORING, ED
Glucose-Capillary: 65 mg/dL — ABNORMAL LOW (ref 70–99)
Glucose-Capillary: 153 mg/dL — ABNORMAL HIGH (ref 70–99)
Glucose-Capillary: 96 mg/dL (ref 70–99)

## 2018-12-20 LAB — CBC
HCT: 27.5 % — ABNORMAL LOW (ref 39.0–52.0)
Hemoglobin: 9.2 g/dL — ABNORMAL LOW (ref 13.0–17.0)
MCH: 31.6 pg (ref 26.0–34.0)
MCHC: 33.5 g/dL (ref 30.0–36.0)
MCV: 94.5 fL (ref 80.0–100.0)
Platelets: 150 10*3/uL (ref 150–400)
RBC: 2.91 MIL/uL — ABNORMAL LOW (ref 4.22–5.81)
RDW: 12.7 % (ref 11.5–15.5)
WBC: 11.8 10*3/uL — ABNORMAL HIGH (ref 4.0–10.5)
nRBC: 0 % (ref 0.0–0.2)

## 2018-12-20 LAB — GLUCOSE, CAPILLARY
Glucose-Capillary: 86 mg/dL (ref 70–99)
Glucose-Capillary: 77 mg/dL (ref 70–99)
Glucose-Capillary: 121 mg/dL — ABNORMAL HIGH (ref 70–99)
Glucose-Capillary: 164 mg/dL — ABNORMAL HIGH (ref 70–99)
Glucose-Capillary: 94 mg/dL (ref 70–99)

## 2018-12-20 LAB — D-DIMER, QUANTITATIVE: D-Dimer, Quant: 1.63 ug/mL-FEU — ABNORMAL HIGH (ref 0.00–0.50)

## 2018-12-20 LAB — LACTIC ACID, PLASMA: Lactic Acid, Venous: 1.6 mmol/L (ref 0.5–1.9)

## 2018-12-20 LAB — PROCALCITONIN: Procalcitonin: <0.1 ng/mL

## 2018-12-20 LAB — C-REACTIVE PROTEIN: CRP: 1.1 mg/dL — ABNORMAL HIGH (ref <1.0)

## 2018-12-20 LAB — FIBRINOGEN: Fibrinogen: 364 mg/dL (ref 210–475)

## 2018-12-20 LAB — ABO/RH: ABO/RH(D): O POS

## 2018-12-20 MED ORDER — DEXTROSE 5 % IV SOLN
INTRAVENOUS | Status: DC
  Administered 2018-12-20 (×2): via INTRAVENOUS

## 2018-12-20 MED ORDER — SODIUM CHLORIDE 0.9 % IV SOLN: 500.0000 mg | Freq: Once | INTRAVENOUS | Status: DC

## 2018-12-20 MED ORDER — CARVEDILOL 3.125 MG PO TABS
3.1250 mg | ORAL_TABLET | Freq: Two times a day (BID) | ORAL | Status: DC
  Administered 2018-12-20 – 2018-12-26 (×10): 3.125 mg via ORAL
  Filled 2018-12-20 (×12): qty 1

## 2018-12-20 MED ORDER — SODIUM CHLORIDE 0.9 % IV SOLN: 1.0000 g | Freq: Once | INTRAVENOUS | Status: DC

## 2018-12-20 MED ORDER — SODIUM CHLORIDE 0.9 % IV SOLN
500.0000 mg | Freq: Once | INTRAVENOUS | Status: AC
  Administered 2018-12-20: 500 mg via INTRAVENOUS
  Filled 2018-12-20: qty 500

## 2018-12-20 MED ORDER — SODIUM CHLORIDE 0.9 % IV BOLUS: 1000.0000 mL | Freq: Once | INTRAVENOUS | Status: DC

## 2018-12-20 MED ORDER — SODIUM CHLORIDE 0.9% FLUSH: 3.0000 mL | Freq: Once | INTRAVENOUS | Status: DC

## 2018-12-20 MED ORDER — ACETAMINOPHEN 650 MG RE SUPP: 650.0000 mg | Freq: Four times a day (QID) | RECTAL | Status: DC | PRN

## 2018-12-20 MED ORDER — ONDANSETRON HCL 4 MG/2ML IJ SOLN: 4.0000 mg | Freq: Four times a day (QID) | INTRAMUSCULAR | Status: DC | PRN

## 2018-12-20 MED ORDER — PANTOPRAZOLE SODIUM 40 MG IV SOLR
40.0000 mg | Freq: Two times a day (BID) | INTRAVENOUS | Status: DC
  Administered 2018-12-20 – 2018-12-24 (×9): 40 mg via INTRAVENOUS
  Filled 2018-12-20 (×9): qty 40

## 2018-12-20 MED ORDER — DEXTROSE 50 % IV SOLN: 1.0000 | Freq: Once | INTRAVENOUS | Status: DC

## 2018-12-20 MED ORDER — INSULIN ASPART 100 UNIT/ML ~~LOC~~ SOLN
0.0000 [IU] | SUBCUTANEOUS | Status: DC
  Administered 2018-12-20: 2 [IU] via SUBCUTANEOUS

## 2018-12-20 MED ORDER — ONDANSETRON HCL 4 MG PO TABS: 4.0000 mg | ORAL_TABLET | Freq: Four times a day (QID) | ORAL | Status: DC | PRN

## 2018-12-20 MED ORDER — PANTOPRAZOLE SODIUM 40 MG IV SOLR: 40.0000 mg | Freq: Once | INTRAVENOUS | Status: DC

## 2018-12-20 MED ORDER — PANTOPRAZOLE SODIUM 40 MG IV SOLR
40.0000 mg | Freq: Once | INTRAVENOUS | Status: AC
  Administered 2018-12-20: 40 mg via INTRAVENOUS
  Filled 2018-12-20: qty 40

## 2018-12-20 MED ORDER — DEXTROSE 50 % IV SOLN
1.0000 | Freq: Once | INTRAVENOUS | Status: AC
  Administered 2018-12-20: 50 mL via INTRAVENOUS
  Filled 2018-12-20: qty 50

## 2018-12-20 MED ORDER — INSULIN ASPART 100 UNIT/ML ~~LOC~~ SOLN
0.0000 [IU] | Freq: Every day | SUBCUTANEOUS | Status: DC
  Administered 2018-12-21: 3 [IU] via SUBCUTANEOUS
  Administered 2018-12-22: 4 [IU] via SUBCUTANEOUS
  Administered 2018-12-23 – 2018-12-24 (×2): 2 [IU] via SUBCUTANEOUS
  Administered 2018-12-25: 3 [IU] via SUBCUTANEOUS

## 2018-12-20 MED ORDER — ACETAMINOPHEN 325 MG PO TABS: 650.0000 mg | ORAL_TABLET | Freq: Four times a day (QID) | ORAL | Status: DC | PRN

## 2018-12-20 MED ORDER — ESCITALOPRAM OXALATE 10 MG PO TABS
10.0000 mg | ORAL_TABLET | Freq: Every day | ORAL | Status: DC
  Administered 2018-12-21 – 2018-12-26 (×6): 10 mg via ORAL
  Filled 2018-12-20 (×9): qty 1

## 2018-12-20 MED ORDER — SODIUM CHLORIDE 0.9 % IV SOLN
1.0000 g | Freq: Once | INTRAVENOUS | Status: AC
  Administered 2018-12-20: 1 g via INTRAVENOUS
  Filled 2018-12-20: qty 10

## 2018-12-20 MED ORDER — ACETAMINOPHEN 500 MG PO TABS: 1000.0000 mg | ORAL_TABLET | Freq: Once | ORAL | Status: DC

## 2018-12-20 MED ORDER — DEXTROSE-NACL 5-0.9 % IV SOLN: INTRAVENOUS | Status: DC

## 2018-12-20 MED ORDER — DOXEPIN HCL 10 MG PO CAPS
10.0000 mg | ORAL_CAPSULE | Freq: Every day | ORAL | Status: DC
  Administered 2018-12-21 – 2018-12-25 (×6): 10 mg via ORAL
  Filled 2018-12-20 (×7): qty 1

## 2018-12-20 MED ORDER — INSULIN ASPART 100 UNIT/ML ~~LOC~~ SOLN
0.0000 [IU] | Freq: Three times a day (TID) | SUBCUTANEOUS | Status: DC
  Administered 2018-12-21: 5 [IU] via SUBCUTANEOUS
  Administered 2018-12-22: 9 [IU] via SUBCUTANEOUS
  Administered 2018-12-22: 5 [IU] via SUBCUTANEOUS
  Administered 2018-12-22 – 2018-12-23 (×2): 7 [IU] via SUBCUTANEOUS

## 2018-12-20 MED ORDER — SODIUM CHLORIDE 0.9 % IV BOLUS
500.0000 mL | Freq: Once | INTRAVENOUS | Status: AC
  Administered 2018-12-20: 500 mL via INTRAVENOUS

## 2018-12-20 MED ORDER — SODIUM CHLORIDE 0.9 % IV BOLUS: 500.0000 mL | Freq: Once | INTRAVENOUS | Status: DC

## 2018-12-20 NOTE — Progress Notes (Signed)
12/20/2018 Patient came from [facility] to [place] [facility] Via care link at [phone]. Patient is alert times one to two, disoriented to time and situation. 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 Patient was placed on a low bed for prevention of fall. [facility] RN.

## 2018-12-20 NOTE — ED Notes (Signed)
Pt transported to CT ?

## 2018-12-20 NOTE — ED Notes (Signed)
POC occult blood is POSITIVE.

## 2018-12-20 NOTE — ED Notes (Signed)
Pt 02 sat decreased to 90% while this RN was in room. Placed pt on 2L [place]. Now 99%

## 2018-12-20 NOTE — Progress Notes (Signed)
[name] is a 73 y.o. male with medical history significant for  Alzheimer dementia, history of CVA, coronary artery disease, type 2 diabetes mellitus, and hypertension, now presenting from his nursing facility for evaluation of fever.  He was found to be stool occult positive, hemoglobin dropped from 10.5 to 8.9, stabilized at 9. GI consulted by EDP who recommended no further intervention.    CT abdomen and pelvis  Showed Fluid-filled colon and small bowel with air-fluid levels, as well as gaseous gastric distension, consistent with gastroenteritis/diarrheal process and proctocolitis.     COVID 19 infection:   CXR showed RML opacity, suspicious for developing infiltrate  Currently on rocephin and azithromycin.  Transfer to [facility] for further evaluation.    Pt seen and examined .  Please see H&p note from Dr Myna Hidalgo    Hosie Poisson, MD [phone]

## 2018-12-20 NOTE — H&P (Signed)
History and Physical    CEDRIC MCCLAINE NWG:[id number] DOB: [birth year] DOA: 12/19/2018  PCP: Sande Brothers, MD   Patient coming from: [facility]   Chief Complaint: Fever   HPI: [name] is a 72 y.o. male with medical history significant for  Alzheimer dementia, history of CVA, coronary artery disease, type 2 diabetes mellitus, and hypertension, now presenting from his nursing facility for evaluation of fever.  Patient reported some abdominal "rumbling" and diarrhea, but denied any pain in his abdomen, and denied any other complaints in the ED.  History is limited by his chronic cognitive deficits and he is unable to say when the diarrhea began.  He denies any cough or shortness of breath.  Baby aspirin is on his medicine list, but no anticoagulants or NSAID, and no PPI or H2 blocker.  No endoscopy reports in EMR.  ED Course: Upon arrival to the ED, patient is found to be febrile to 38.6 C, saturating low 90s on room air, slightly tachypneic, slightly bradycardic, and with blood pressure 100/57.  EKG features a sinus rhythm and chest x-ray is notable for subtle opacity at the right mid to lower lung which could reflect atelectasis or developing infiltrate.  CT of the abdomen and pelvis reveals fluid-filled colon and small bowel with air-fluid levels and gaseous gastric distention consistent with gastroenteritis without obstruction, but with rectosigmoid colonic wall thickening which could represent focal proctocolitis.  Chemistry panel is notable for sodium of 133 and creatinine 1.55, up from 1.26 in November.  CBC features a mild leukocytosis and hemoglobin of 8.9, down from 10.5 in October.  Lactic acid is reassuringly normal.  Fecal occult blood testing was positive.  Blood cultures were collected, 1 L of normal saline was administered, and the patient was treated with Rocephin, azithromycin, and IV Protonix in the ED. gastroenterology was consulted by the ED physician and hospitalists asked to  admit.  Review of Systems:  Unable to complete ROS secondary to the patient's clinical condition.  Past Medical History:  Diagnosis Date  . Coronary artery disease   . Diabetes mellitus    type II  . Gastritis   . Glaucoma    hx of  . Hypercholesterolemia   . Hypertension   . Insomnia   . Stroke [facility])    hx of multiple    Past Surgical History:  Procedure Laterality Date  . CATARACT EXTRACTION    . CORONARY ARTERY BYPASS GRAFT  2006   LIMA to LAD; SVG gto diagonal; SVG to ramus/L [facility])     reports that he has never smoked. He has never used smokeless tobacco. He reports that he does not drink alcohol or use drugs.  No Known Allergies  Family History  Problem Relation Age of Onset  . Diabetes Father 68  . Other Mother 58       alive     Prior to Admission medications   Medication Sig Start Date End Date Taking? Authorizing Provider  acetaminophen (TYLENOL) 325 MG tablet Take 650 mg by mouth 2 (two) times daily.    [provider]  Acetaminophen 325 MG CAPS Take 650 mg by mouth every 4 (four) hours as needed for mild pain.    [provider]  aspirin 81 MG chewable tablet Chew 81 mg by mouth daily.     [provider]  atorvastatin (LIPITOR) 40 MG tablet Take 40 mg by mouth at bedtime.     [provider]  carvedilol (COREG) 3.125  MG tablet Take 3.125 mg by mouth 2 (two) times daily.  02/27/18   [provider]  doxepin (SINEQUAN) 10 MG capsule Take 10 mg by mouth at bedtime.  02/27/18   [provider]  escitalopram (LEXAPRO) 10 MG tablet Take 10 mg by mouth daily.  02/27/18   [provider]  ferrous gluconate (FERGON) 324 MG tablet Take 324 mg by mouth daily.    [provider]  furosemide (LASIX) 40 MG tablet Take 40 mg by mouth daily.     [provider]  insulin glargine (LANTUS) 100 UNIT/ML injection Inject 5-19 Units into the skin See admin instructions. Inject 14 units every morning  and 5 units every night at bedtime.    [provider]  lisinopril (PRINIVIL,ZESTRIL) 5 MG tablet Take 5 mg by mouth daily.  02/27/18   [provider]  metFORMIN (GLUCOPHAGE) 1000 MG tablet Take 1,000 mg by mouth 2 (two) times daily.  03/04/18   [provider]    Physical Exam: Vitals:   12/19/18 2315 12/19/18 2338 12/19/18 2345 12/20/18 0046  BP: 114/86 129/63 128/62   Pulse:   (!) 52   Resp: (!) 23 20 (!) 22   Temp:    97.6 F (36.4 C)  TempSrc:    Oral  SpO2:   92%   Weight:      Height:        Constitutional: NAD, calm  Eyes: PERTLA, lids and conjunctivae normal ENMT: Mucous membranes are moist. Posterior pharynx clear of any exudate or lesions.   Neck: normal, supple, no masses, no thyromegaly Respiratory: no wheezing, no crackles. No accessory muscle use.  Cardiovascular: S1 & S2 heard, regular rate and rhythm. No extremity edema.   Abdomen: No distension, no tenderness, soft. Bowel sounds active.  Musculoskeletal: no clubbing / cyanosis. No joint deformity upper and lower extremities.  Skin: no significant rashes, lesions, ulcers. Warm, dry, well-perfused. Neurologic: No gross facial asymmetry. Sensation intact. Moving all extremities.  Psychiatric: Alert and oriented to person only. Pleasant, cooperative.    Labs on Admission: I have personally reviewed following labs and imaging studies  CBC: Recent Labs  Lab 12/19/18 2231  WBC 10.7*  NEUTROABS 5.9  HGB 8.9*  HCT 27.6*  MCV 95.8  PLT 818   Basic Metabolic Panel: Recent Labs  Lab 12/19/18 2231  NA 133*  K 5.1  CL 100  CO2 24  GLUCOSE 87  BUN 25*  CREATININE 1.55*  CALCIUM 8.8*   GFR: Estimated Creatinine Clearance: 45.9 mL/min (A) (by C-G formula based on SCr of 1.55 mg/dL (H)). Liver Function Tests: Recent Labs  Lab 12/19/18 2231  AST 33  ALT 27  ALKPHOS 53  BILITOT 0.7  PROT 6.7  ALBUMIN 3.3*   Recent Labs  Lab 12/19/18 2337  LIPASE 32   No results for  input(s): AMMONIA in the last 168 hours. Coagulation Profile: Recent Labs  Lab 12/19/18 2231  INR 1.1   Cardiac Enzymes: No results for input(s): CKTOTAL, CKMB, CKMBINDEX, TROPONINI in the last 168 hours. BNP (last 3 results) No results for input(s): PROBNP in the last 8760 hours. HbA1C: No results for input(s): HGBA1C in the last 72 hours. CBG: No results for input(s): GLUCAP in the last 168 hours. Lipid Profile: No results for input(s): CHOL, HDL, LDLCALC, TRIG, CHOLHDL, LDLDIRECT in the last 72 hours. Thyroid Function Tests: No results for input(s): TSH, T4TOTAL, FREET4, T3FREE, THYROIDAB in the last 72 hours. Anemia Panel: No results  for input(s): VITAMINB12, FOLATE, FERRITIN, TIBC, IRON, RETICCTPCT in the last 72 hours. Urine analysis:    Component Value Date/Time   COLORURINE YELLOW 05/05/2018 [place] 05/05/2018 1438   LABSPEC <=1.005 (A) 05/05/2018 1438   PHURINE 6.0 05/05/2018 1438   GLUCOSEU NEGATIVE 05/05/2018 1438   HGBUR NEGATIVE 05/05/2018 1438   BILIRUBINUR NEGATIVE 05/05/2018 1438   KETONESUR NEGATIVE 05/05/2018 1438   PROTEINUR 100 (A) 03/31/2018 1049   UROBILINOGEN 0.2 05/05/2018 1438   NITRITE NEGATIVE 05/05/2018 1438   LEUKOCYTESUR NEGATIVE 05/05/2018 1438   Sepsis Labs: @LABRCNTIP (procalcitonin:4,lacticidven:4) ) Recent Results (from the past 240 hour(s))  SARS Coronavirus 2 (CEPHEID - Performed in [place] hospital lab), Hosp Order     Status: Abnormal   Collection Time: 12/19/18 10:40 PM   Specimen: Nasopharyngeal Swab  Result Value Ref Range Status   SARS Coronavirus 2 POSITIVE (A) NEGATIVE Final    Comment: RESULT CALLED TO, READ BACK BY AND VERIFIED WITH: T [facility] RN 12/19/18 2354 JDW (NOTE) If result is NEGATIVE SARS-CoV-2 target nucleic acids are NOT DETECTED. The SARS-CoV-2 RNA is generally detectable in upper and lower  respiratory specimens during the acute phase of infection. The lowest  concentration of  SARS-CoV-2 viral copies this assay can detect is 250  copies / mL. A negative result does not preclude SARS-CoV-2 infection  and should not be used as the sole basis for treatment or other  patient management decisions.  A negative result may occur with  improper specimen collection / handling, submission of specimen other  than nasopharyngeal swab, presence of viral mutation(s) within the  areas targeted by this assay, and inadequate number of viral copies  (<250 copies / mL). A negative result must be combined with clinical  observations, patient history, and epidemiological information. If result is POSITIVE SARS-CoV-2 target nucleic acids are DETECTED. The SARS -CoV-2 RNA is generally detectable in upper and lower  respiratory specimens during the acute phase of infection.  Positive  results are indicative of active infection with SARS-CoV-2.  Clinical  correlation with patient history and other diagnostic information is  necessary to determine patient infection status.  Positive results do  not rule out bacterial infection or co-infection with other viruses. If result is PRESUMPTIVE POSTIVE SARS-CoV-2 nucleic acids MAY BE PRESENT.   A presumptive positive result was obtained on the submitted specimen  and confirmed on repeat testing.  While 2019 novel coronavirus  (SARS-CoV-2) nucleic acids may be present in the submitted sample  additional confirmatory testing may be necessary for epidemiological  and / or clinical management purposes  to differentiate between  SARS-CoV-2 and other Sarbecovirus currently known to infect humans.  If clinically indicated additional testing with an alternate test  methodology [phone]) is advise d. The SARS-CoV-2 RNA is generally  detectable in upper and lower respiratory specimens during the acute  phase of infection. The expected result is Negative. Fact Sheet for Patients:  StrictlyIdeas.no Fact Sheet for Healthcare  Providers: BankingDealers.co.za This test is not yet approved or cleared by the [place] FDA and has been authorized for detection and/or diagnosis of SARS-CoV-2 by FDA under an Emergency Use Authorization (EUA).  This EUA will remain in effect (meaning this test can be used) for the duration of the COVID-19 declaration under Section 564(b)(1) of the Act, 21 U.S.C. section 360bbb-3(b)(1), unless the authorization is terminated or revoked sooner. Performed at [facility], [address] [place], [place] [address]  Radiological Exams on Admission: Ct Abdomen Pelvis Wo Contrast  Result Date: 12/20/2018 CLINICAL DATA:  Acute abdominal pain. COVID-19 positive. EXAM: CT ABDOMEN AND PELVIS WITHOUT CONTRAST TECHNIQUE: Multidetector CT imaging of the abdomen and pelvis was performed following the standard protocol without IV contrast. COMPARISON:  CT 04/28/2007 FINDINGS: Lower chest: Trace right pleural effusion. Right greater than left lower lobe atelectasis. There are coronary artery calcifications. Hepatobiliary: Multiple gallstones within physiologically distended gallbladder. No definite pericholecystic inflammation. No biliary dilatation. Probable hepatic arterial calcifications. No focal lesion on noncontrast exam. Pancreas: No ductal dilatation or inflammation. Spleen: Normal in size without focal abnormality. Adrenals/Urinary Tract: Small left adrenal adenoma. No right adrenal nodule. Symmetric bilateral perinephric edema. No hydronephrosis. No urolithiasis. Urinary bladder is physiologically distended. No bladder wall thickening. Stomach/Bowel: Small hiatal hernia. Gaseous gastric distension without wall thickening. Air-fluid levels within slightly distended distal small bowel, no definite wall thickening or adjacent inflammation. Fluid/liquid stool throughout the colon with few scattered air-fluid levels. Minor distal colonic diverticulosis without  diverticulitis. Formed stool in the rectosigmoid colon with moderate rectosigmoid wall thickening. Mild adjacent perirectal stranding. Normal appendix tentatively visualized. Vascular/Lymphatic: Advanced aortic and branch atherosclerosis. No aneurysm. No bulky abdominopelvic adenopathy. Reproductive: Prominent prostate gland spanning 5.2 cm transverse. Other: No free air. No significant free fluid. Musculoskeletal: Bones are under mineralized. Rule out bilateral inferior pubic rami fractures. Remote right superior pubic ramus fracture. There are no acute or suspicious osseous abnormalities. IMPRESSION: 1. Fluid-filled colon and small bowel with air-fluid levels, as well as gaseous gastric distension, consistent with gastroenteritis/diarrheal process. No obstruction. 2. Rectosigmoid colonic wall thickening with mild pericolonic edema, likely focal proctocolitis. 3. Cholelithiasis without gallbladder inflammation. 4. Advanced aortic and branch atherosclerosis. Aortic Atherosclerosis (ICD10-I70.0). 5. Trace right pleural effusion. Electronically Signed   By: Keith Rake M.D.   On: 12/20/2018 00:39   Dg Chest Portable 1 View  Result Date: 12/19/2018 CLINICAL DATA:  Suspected sepsis. EXAM: PORTABLE CHEST 1 VIEW COMPARISON:  March 31, 2018 FINDINGS: Probable skin fold over the right lung base. Stable cardiomegaly. The hila and mediastinum are unremarkable. No pneumothorax. The left lung is clear. Subtle opacity in the left mid lower lung is identified. No other acute abnormalities. IMPRESSION: Subtle opacity in the right mid lower lung may represent atelectasis or developing infiltrate. No other acute abnormalities. Electronically Signed   By: Dorise Bullion III M.D   On: 12/19/2018 22:45    EKG: Independently reviewed. Sinus rhythm, QTc 424 ms.   Assessment/Plan   1. Gastroenteritis; occult GI bleeding  - Presents from SNF with fever and diarrhea, found to have Hgb of 8.9, down from 10.5 in October,  and FOBT is positive  - Abdominal exam benign, CT findings consistent with diarrheal state and possible proctocolitis  - He was given 40 mg IV Protonix in ED  - Gastroenterology consulted by ED physician  - Type and screen, hold ASA, monitor H&H, consider transfer to [facility] if remains stable and no GI intervention required (and if he still requires hospitalization)    2. COVID-19 infection  - Presents with fever and diarrhea, found to be positive to COVID-19  - There is no conspicuous CXR abnormality but a subtle RML opacity that could reflect atelectasis or developing infiltrate, no tachypnea, and no hypoxia  - Suspect the GI illness is secondary to this  - Blood cultures collected in ED, Rocephin and azithromycin administered  - Check procalcitonin and continue abx if elevated, check/trend inflammatory markers, continue supportive care and infection-control  precautions    3. CAD  - No anginal complaints  - Hold ASA while monitoring GIB    4. Type II DM  - A1c 6.3% in November 2019  - Managed with Lantus and metformin at [facility]  - Start with SSI with Novolog only while in hospital, adjust as needed    5. Hypertension  - BP was low initially, normalized with IVF, will hold antihypertensives initially   6. History of CVA  - No focal deficits noted  - Hold ASA while monitoring GIB    7. Dementia  - Alzheimer dementia, oriented to person and ambulates with walker at baseline  - Continue supportive care    8. Renal insufficiency    - SCr is 1.55 on admission, up from 1.26 in November 2019, likely CKD III  - Renally-dose medications, monitor   9. Hyponatremia  - Serum sodium is 133 on admission in setting of hypovolemia  - Given a liter NS in ED, will repeat chem panel in am    PPE: CAPR, gown, gloves. Patient wearing mask.  DVT prophylaxis: SCD's  Code Status: Full  Family Communication: Sister, Saliou Barnier, updated by phone   Consults called: Gastroenterology consulted by ED  physician  Admission status: Inpatient. The patient is elderly with significant comorbidity and will require inpatient management with specialist consultation in order to avoid/treat life-threatening complications.     Vianne Bulls, MD [facility] Pager [phone]  If 7PM-7AM, please contact night-coverage [email or web address] Password [email or web address]  12/20/2018, 1:31 AM

## 2018-12-20 NOTE — ED Notes (Signed)
MD paged due to CBG 65. Pt too tired to eat/drink what was offered

## 2018-12-20 NOTE — ED Notes (Signed)
Pt assisted to bedside commode with one person assist. Pt had large formed BM with some loose stool.

## 2018-12-20 NOTE — ED Notes (Signed)
ED TO INPATIENT HANDOFF REPORT  ED Nurse Name and Phone #: Kelly Splinter  S Name/Age/Gender [name] 73 y.o. male Room/Bed: 017C/017C  Code Status   Code Status: Full Code  Home/SNF/Other Nursing Home Patient oriented to: self Is this baseline? Yes   Triage Complete: Triage complete  Chief Complaint fever  Triage Note Pt from [facility] by EMS with fever of 103F. EMS VS 93% on room air, BP 126/64, P 66, CBG 110. Pt denied any SOB. Pt now on 2L O2 and is now at 100%. Pt has no complaints at this time. 650mg  Tylenol given at 1900 tonight by facility. Temp in triage 100F. Hx of dementia at baseline, ambulates with walker.    Allergies No Known Allergies  Level of Care/Admitting Diagnosis ED Disposition    ED Disposition Condition Comment   Admit  Hospital Area: [facility] [[id number]]  Level of Care: Med-Surg [16]  Covid Evaluation: Confirmed COVID Positive  Diagnosis: Occult GI bleeding [[id number]]  Admitting Physician: Vianne Bulls [[id number]]  Attending Physician: Vianne Bulls [[id number]]  Estimated length of stay: past midnight tomorrow  Certification:: I certify this patient will need inpatient services for at least 2 midnights  PT Class (Do Not Modify): Inpatient [101]  PT Acc Code (Do Not Modify): Private [1]       B Medical/Surgery History Past Medical History:  Diagnosis Date  . Coronary artery disease   . Diabetes mellitus    type II  . Gastritis   . Glaucoma    hx of  . Hypercholesterolemia   . Hypertension   . Insomnia   . Stroke [facility])    hx of multiple   Past Surgical History:  Procedure Laterality Date  . CATARACT EXTRACTION    . CORONARY ARTERY BYPASS GRAFT  2006   LIMA to LAD; SVG gto diagonal; SVG to ramus/L [facility])     A IV Location/Drains/Wounds Patient Lines/Drains/Airways Status   Active Line/Drains/Airways    Name:   Placement date:   Placement time:   Site:   Days:   Peripheral IV 12/19/18 Left  Antecubital   12/19/18    2237    Antecubital   1          Intake/Output Last 24 hours  Intake/Output Summary (Last 24 hours) at 12/20/2018 0750 Last data filed at 12/20/2018 0241 Gross per 24 hour  Intake 1088.03 ml  Output -  Net 1088.03 ml    Labs/Imaging Results for orders placed or performed during the hospital encounter of 12/19/18 (from the past 48 hour(s))  Comprehensive metabolic panel     Status: Abnormal   Collection Time: 12/19/18 10:31 PM  Result Value Ref Range   Sodium 133 (L) 135 - 145 mmol/L   Potassium 5.1 3.5 - 5.1 mmol/L   Chloride 100 98 - 111 mmol/L   CO2 24 22 - 32 mmol/L   Glucose, Bld 87 70 - 99 mg/dL   BUN 25 (H) 8 - 23 mg/dL   Creatinine, Ser 1.55 (H) 0.61 - 1.24 mg/dL   Calcium 8.8 (L) 8.9 - 10.3 mg/dL   Total Protein 6.7 6.5 - 8.1 g/dL   Albumin 3.3 (L) 3.5 - 5.0 g/dL   AST 33 15 - 41 U/L   ALT 27 0 - 44 U/L   Alkaline Phosphatase 53 38 - 126 U/L   Total Bilirubin 0.7 0.3 - 1.2 mg/dL   GFR calc non Af Amer 44 (L) >60 mL/min  GFR calc Af Amer 51 (L) >60 mL/min   Anion gap 9 5 - 15    Comment: Performed at [facility] [address], [place], [place] [address]  Lactic acid, plasma     Status: None   Collection Time: 12/19/18 10:31 PM  Result Value Ref Range   Lactic Acid, Venous 1.7 0.5 - 1.9 mmol/L    Comment: Performed at [facility] [address], [place], [place] [address]  CBC with Differential     Status: Abnormal   Collection Time: 12/19/18 10:31 PM  Result Value Ref Range   WBC 10.7 (H) 4.0 - 10.5 K/uL   RBC 2.88 (L) 4.22 - 5.81 MIL/uL   Hemoglobin 8.9 (L) 13.0 - 17.0 g/dL   HCT 27.6 (L) 39.0 - 52.0 %   MCV 95.8 80.0 - 100.0 fL   MCH 30.9 26.0 - 34.0 pg   MCHC 32.2 30.0 - 36.0 g/dL   RDW 12.5 11.5 - 15.5 %   Platelets 179 150 - 400 K/uL   nRBC 0.0 0.0 - 0.2 %   Neutrophils Relative % 56 %   Neutro Abs 5.9 1.7 - 7.7 K/uL   Lymphocytes Relative 26 %   Lymphs Abs 2.8 0.7 - 4.0 K/uL   Monocytes Relative 17 %    Monocytes Absolute 1.8 (H) 0.1 - 1.0 K/uL   Eosinophils Relative 0 %   Eosinophils Absolute 0.0 0.0 - 0.5 K/uL   Basophils Relative 1 %   Basophils Absolute 0.1 0.0 - 0.1 K/uL   Immature Granulocytes 0 %   Abs Immature Granulocytes 0.03 0.00 - 0.07 K/uL    Comment: Performed at [facility], [place] [address] [place], [place] [address]  Protime-INR     Status: None   Collection Time: 12/19/18 10:31 PM  Result Value Ref Range   Prothrombin Time 13.8 11.4 - 15.2 seconds   INR 1.1 0.8 - 1.2    Comment: (NOTE) INR goal varies based on device and disease states. Performed at [facility], [place] [address], [place], [place] [address]   SARS Coronavirus 2 (CEPHEID - Performed in [facility] hospital lab), Hosp Order     Status: Abnormal   Collection Time: 12/19/18 10:40 PM   Specimen: Nasopharyngeal Swab  Result Value Ref Range   SARS Coronavirus 2 POSITIVE (A) NEGATIVE    Comment: RESULT CALLED TO, READ BACK BY AND VERIFIED WITH: T [facility] RN 12/19/18 2354 JDW (NOTE) If result is NEGATIVE SARS-CoV-2 target nucleic acids are NOT DETECTED. The SARS-CoV-2 RNA is generally detectable in upper and lower  respiratory specimens during the acute phase of infection. The lowest  concentration of SARS-CoV-2 viral copies this assay can detect is 250  copies / mL. A negative result does not preclude SARS-CoV-2 infection  and should not be used as the sole basis for treatment or other  patient management decisions.  A negative result may occur with  improper specimen collection / handling, submission of specimen other  than nasopharyngeal swab, presence of viral mutation(s) within the  areas targeted by this assay, and inadequate number of viral copies  (<250 copies / mL). A negative result must be combined with clinical  observations, patient history, and epidemiological information. If result is POSITIVE SARS-CoV-2 target nucleic acids are DETECTED. The SARS -CoV-2 RNA is generally  detectable in upper and lower  respiratory specimens during the acute phase of infection.  Positive  results are indicative of active  infection with SARS-CoV-2.  Clinical  correlation with patient history and other diagnostic information is  necessary to determine patient infection status.  Positive results do  not rule out bacterial infection or co-infection with other viruses. If result is PRESUMPTIVE POSTIVE SARS-CoV-2 nucleic acids MAY BE PRESENT.   A presumptive positive result was obtained on the submitted specimen  and confirmed on repeat testing.  While 2019 novel coronavirus  (SARS-CoV-2) nucleic acids may be present in the submitted sample  additional confirmatory testing may be necessary for epidemiological  and / or clinical management purposes  to differentiate between  SARS-CoV-2 and other Sarbecovirus currently known to infect humans.  If clinically indicated additional testing with an alternate test  methodology [phone]) is advise d. The SARS-CoV-2 RNA is generally  detectable in upper and lower respiratory specimens during the acute  phase of infection. The expected result is Negative. Fact Sheet for Patients:  StrictlyIdeas.no Fact Sheet for Healthcare Providers: BankingDealers.co.za This test is not yet approved or cleared by the [place] FDA and has been authorized for detection and/or diagnosis of SARS-CoV-2 by FDA under an Emergency Use Authorization (EUA).  This EUA will remain in effect (meaning this test can be used) for the duration of the COVID-19 declaration under Section 564(b)(1) of the Act, 21 U.S.C. section 360bbb-3(b)(1), unless the authorization is terminated or revoked sooner. Performed at [facility], [place] [address] [place], [place] [address]   Lipase, blood     Status: None   Collection Time: 12/19/18 11:37 PM  Result Value Ref Range   Lipase 32 11 - 51 U/L    Comment: Performed at  [facility], [place] [address] [place], [place] [address]  Lactic acid, plasma     Status: None   Collection Time: 12/20/18  1:49 AM  Result Value Ref Range   Lactic Acid, Venous 1.6 0.5 - 1.9 mmol/L    Comment: Performed at [facility] [address], [place], [place] [address]  Hemoglobin A1c     Status: Abnormal   Collection Time: 12/20/18  1:49 AM  Result Value Ref Range   Hgb A1c MFr Bld 6.7 (H) 4.8 - 5.6 %    Comment: (NOTE) Pre diabetes:          5.7%-6.4% Diabetes:              >6.4% Glycemic control for   <7.0% adults with diabetes    Mean Plasma Glucose 145.59 mg/dL    Comment: Performed at [facility] [address] [place], [place] [address]  C-reactive protein     Status: Abnormal   Collection Time: 12/20/18  1:49 AM  Result Value Ref Range   CRP 1.1 (H) <1.0 mg/dL    Comment: Performed at [facility] [address], [place], [place] [address]  Fibrinogen     Status: None   Collection Time: 12/20/18  1:49 AM  Result Value Ref Range   Fibrinogen 364 210 - 475 mg/dL    Comment: Performed at [facility] [address] [place], [place] [address]  D-dimer, quantitative (not at [facility])     Status: Abnormal   Collection Time: 12/20/18  1:49 AM  Result Value Ref Range   D-Dimer, Quant 1.63 (H) 0.00 - 0.50 ug/mL-FEU    Comment: (NOTE) At the manufacturer cut-off of 0.50 ug/mL FEU, this assay has been documented to exclude PE with a sensitivity and negative predictive value  of 97 to 99%.  At this time, this assay has not been approved by the FDA to exclude DVT/VTE. Results should be correlated with clinical presentation. Performed at [facility], [place] [address], [place], [place] [address]   Procalcitonin     Status: None   Collection Time: 12/20/18  1:49 AM  Result Value Ref Range   Procalcitonin <0.10 ng/mL    Comment:        Interpretation: PCT (Procalcitonin) <= 0.5 ng/mL: Systemic infection (sepsis) is not  likely. Local bacterial infection is possible. (NOTE)       Sepsis PCT Algorithm           Lower Respiratory Tract                                      Infection PCT Algorithm    ----------------------------     ----------------------------         PCT < 0.25 ng/mL                PCT < 0.10 ng/mL         Strongly encourage             Strongly discourage   discontinuation of antibiotics    initiation of antibiotics    ----------------------------     -----------------------------       PCT 0.25 - 0.50 ng/mL            PCT 0.10 - 0.25 ng/mL               OR       >80% decrease in PCT            Discourage initiation of                                            antibiotics      Encourage discontinuation           of antibiotics    ----------------------------     -----------------------------         PCT >= 0.50 ng/mL              PCT 0.26 - 0.50 ng/mL               AND        <80% decrease in PCT             Encourage initiation of                                             antibiotics       Encourage continuation           of antibiotics    ----------------------------     -----------------------------        PCT >= 0.50 ng/mL                  PCT > 0.50 ng/mL               AND         increase in PCT  Strongly encourage                                      initiation of antibiotics    Strongly encourage escalation           of antibiotics                                     -----------------------------                                           PCT <= 0.25 ng/mL                                                 OR                                        > 80% decrease in PCT                                     Discontinue / Do not initiate                                             antibiotics Performed at [facility], [place] [address], [place], [place] [address]   Lactate dehydrogenase     Status: Abnormal   Collection Time: 12/20/18  1:49 AM  Result  Value Ref Range   LDH 221 (H) 98 - 192 U/L    Comment: Performed at [facility], [place] [address], [place], [place] [address]  Type and screen [place]     Status: None   Collection Time: 12/20/18  2:00 AM  Result Value Ref Range   ABO/RH(D) O POS    Antibody Screen NEG    Sample Expiration      12/23/2018,2359 Performed at [facility], [place] [address], [place], [place] [address]   ABO/Rh     Status: None (Preliminary result)   Collection Time: 12/20/18  2:00 AM  Result Value Ref Range   ABO/RH(D)      O POS Performed at [facility] [address] [place], [place] [address]   Urinalysis, Routine w reflex microscopic     Status: Abnormal   Collection Time: 12/20/18  2:02 AM  Result Value Ref Range   Color, Urine YELLOW YELLOW   APPearance CLEAR CLEAR   Specific Gravity, Urine 1.014 1.005 - 1.030   pH 5.0 5.0 - 8.0   Glucose, UA NEGATIVE NEGATIVE mg/dL   Hgb urine dipstick NEGATIVE NEGATIVE   Bilirubin Urine NEGATIVE NEGATIVE   Ketones, ur NEGATIVE NEGATIVE mg/dL   Protein, ur 100 (A) NEGATIVE mg/dL   Nitrite NEGATIVE NEGATIVE   Leukocytes,Ua NEGATIVE NEGATIVE   RBC / HPF 11-20 0 -  5 RBC/hpf   WBC, UA 0-5 0 - 5 WBC/hpf   Bacteria, UA NONE SEEN NONE SEEN   Squamous Epithelial / LPF 0-5 0 - 5   Mucus PRESENT    Hyaline Casts, UA PRESENT     Comment: Performed at [facility], [place] [address] [place], [place] [address]  CBG monitoring, ED     Status: Abnormal   Collection Time: 12/20/18  4:19 AM  Result Value Ref Range   Glucose-Capillary 65 (L) 70 - 99 mg/dL   Comment 1 Notify RN    Comment 2 Document in Chart   CBG monitoring, ED     Status: Abnormal   Collection Time: 12/20/18  5:37 AM  Result Value Ref Range   Glucose-Capillary 153 (H) 70 - 99 mg/dL  CBG monitoring, ED     Status: None   Collection Time: 12/20/18  7:30 AM  Result Value Ref Range   Glucose-Capillary 96 70 - 99 mg/dL   Ct Abdomen Pelvis Wo  Contrast  Result Date: 12/20/2018 CLINICAL DATA:  Acute abdominal pain. COVID-19 positive. EXAM: CT ABDOMEN AND PELVIS WITHOUT CONTRAST TECHNIQUE: Multidetector CT imaging of the abdomen and pelvis was performed following the standard protocol without IV contrast. COMPARISON:  CT 04/28/2007 FINDINGS: Lower chest: Trace right pleural effusion. Right greater than left lower lobe atelectasis. There are coronary artery calcifications. Hepatobiliary: Multiple gallstones within physiologically distended gallbladder. No definite pericholecystic inflammation. No biliary dilatation. Probable hepatic arterial calcifications. No focal lesion on noncontrast exam. Pancreas: No ductal dilatation or inflammation. Spleen: Normal in size without focal abnormality. Adrenals/Urinary Tract: Small left adrenal adenoma. No right adrenal nodule. Symmetric bilateral perinephric edema. No hydronephrosis. No urolithiasis. Urinary bladder is physiologically distended. No bladder wall thickening. Stomach/Bowel: Small hiatal hernia. Gaseous gastric distension without wall thickening. Air-fluid levels within slightly distended distal small bowel, no definite wall thickening or adjacent inflammation. Fluid/liquid stool throughout the colon with few scattered air-fluid levels. Minor distal colonic diverticulosis without diverticulitis. Formed stool in the rectosigmoid colon with moderate rectosigmoid wall thickening. Mild adjacent perirectal stranding. Normal appendix tentatively visualized. Vascular/Lymphatic: Advanced aortic and branch atherosclerosis. No aneurysm. No bulky abdominopelvic adenopathy. Reproductive: Prominent prostate gland spanning 5.2 cm transverse. Other: No free air. No significant free fluid. Musculoskeletal: Bones are under mineralized. Rule out bilateral inferior pubic rami fractures. Remote right superior pubic ramus fracture. There are no acute or suspicious osseous abnormalities. IMPRESSION: 1. Fluid-filled colon and  small bowel with air-fluid levels, as well as gaseous gastric distension, consistent with gastroenteritis/diarrheal process. No obstruction. 2. Rectosigmoid colonic wall thickening with mild pericolonic edema, likely focal proctocolitis. 3. Cholelithiasis without gallbladder inflammation. 4. Advanced aortic and branch atherosclerosis. Aortic Atherosclerosis (ICD10-I70.0). 5. Trace right pleural effusion. Electronically Signed   By: Keith Rake M.D.   On: 12/20/2018 00:39   Dg Chest Portable 1 View  Result Date: 12/19/2018 CLINICAL DATA:  Suspected sepsis. EXAM: PORTABLE CHEST 1 VIEW COMPARISON:  March 31, 2018 FINDINGS: Probable skin fold over the right lung base. Stable cardiomegaly. The hila and mediastinum are unremarkable. No pneumothorax. The left lung is clear. Subtle opacity in the left mid lower lung is identified. No other acute abnormalities. IMPRESSION: Subtle opacity in the right mid lower lung may represent atelectasis or developing infiltrate. No other acute abnormalities. Electronically Signed   By: Dorise Bullion III M.D   On: 12/19/2018 22:45    Pending Labs Unresulted Labs (From admission, onward)    Start     Ordered  12/21/18 0500  CBC with Differential/Platelet  Daily,   R     12/20/18 0131   12/21/18 0500  Comprehensive metabolic panel  Daily,   R     12/20/18 0131   12/21/18 0500  C-reactive protein  Daily,   R     12/20/18 0131   12/21/18 0500  D-dimer, quantitative (not at [facility])  Daily,   R     12/20/18 0131   12/20/18 0131  Culture, sputum-assessment  Once,   R     12/20/18 0131   12/20/18 0130  Interleukin-6, Plasma  Once,   STAT     12/20/18 0131   12/19/18 2220  Culture, blood (Routine x 2)  BLOOD CULTURE X 2,   STAT     12/19/18 2219          Vitals/Pain Today's Vitals   12/20/18 0615 12/20/18 0630 12/20/18 0739 12/20/18 0747  BP:  (!) 155/69 (!) 152/71   Pulse: (!) 55  60   Resp:   20   Temp:    97.7 F (36.5 C)  TempSrc:    Oral  SpO2:  100%  100%   Weight:      Height:      PainSc:        Isolation Precautions Airborne and Contact precautions  Medications Medications  pantoprazole (PROTONIX) injection 40 mg (has no administration in time range)  insulin aspart (novoLOG) injection 0-9 Units (0 Units Subcutaneous Not Given 12/20/18 0415)  ondansetron (ZOFRAN) tablet 4 mg (has no administration in time range)    Or  ondansetron (ZOFRAN) injection 4 mg (has no administration in time range)  acetaminophen (TYLENOL) tablet 650 mg (has no administration in time range)    Or  acetaminophen (TYLENOL) suppository 650 mg (has no administration in time range)  dextrose 5 % solution ( Intravenous New Bag/Given 12/20/18 0541)  sodium chloride flush (NS) 0.9 % injection 3 mL (3 mLs Intravenous Given 12/19/18 2238)  acetaminophen (TYLENOL) tablet 1,000 mg (1,000 mg Oral Given 12/19/18 2244)  sodium chloride 0.9 % bolus 1,000 mL (0 mLs Intravenous Stopped 12/19/18 2340)  cefTRIAXone (ROCEPHIN) 1 g in sodium chloride 0.9 % 100 mL IVPB (0 g Intravenous Stopped 12/20/18 0241)  azithromycin (ZITHROMAX) 500 mg in sodium chloride 0.9 % 250 mL IVPB (0 mg Intravenous Stopped 12/20/18 0410)  pantoprazole (PROTONIX) injection 40 mg (40 mg Intravenous Given 12/20/18 0205)  sodium chloride 0.9 % bolus 500 mL (0 mLs Intravenous Stopped 12/20/18 0725)  dextrose 50 % solution 50 mL (50 mLs Intravenous Given 12/20/18 0501)    Mobility walks with person assist High fall risk   Focused Assessments Pulmonary Assessment Handoff:  Lung sounds:   O2 Device: Room Air        R Recommendations: See Admitting Provider Note  Report given to:   Additional Notes:

## 2018-12-20 NOTE — ED Notes (Signed)
Both blood cultures obtained before antibiotics started.

## 2018-12-20 NOTE — ED Notes (Addendum)
Pt used urinal. Pt had an episode of diarrhea. Pt reports having the "squirts." Stool sample collected. No blood noted in stool. MD Opyd notified.

## 2018-12-21 ENCOUNTER — Inpatient Hospital Stay (HOSPITAL_COMMUNITY): Payer: Medicare HMO

## 2018-12-21 ENCOUNTER — Other Ambulatory Visit: Payer: Self-pay

## 2018-12-21 LAB — COMPREHENSIVE METABOLIC PANEL
ALT: 27 U/L (ref 0–44)
AST: 33 U/L (ref 15–41)
Albumin: 3.2 g/dL — ABNORMAL LOW (ref 3.5–5.0)
Alkaline Phosphatase: 51 U/L (ref 38–126)
Anion gap: 7 (ref 5–15)
BUN: 24 mg/dL — ABNORMAL HIGH (ref 8–23)
CO2: 25 mmol/L (ref 22–32)
Calcium: 8.5 mg/dL — ABNORMAL LOW (ref 8.9–10.3)
Chloride: 103 mmol/L (ref 98–111)
Creatinine, Ser: 1.32 mg/dL — ABNORMAL HIGH (ref 0.61–1.24)
GFR calc Af Amer: >60 mL/min (ref >60)
GFR calc non Af Amer: 54 mL/min — ABNORMAL LOW (ref >60)
Glucose, Bld: 82 mg/dL (ref 70–99)
Potassium: 4.8 mmol/L (ref 3.5–5.1)
Sodium: 135 mmol/L (ref 135–145)
Total Bilirubin: 0.6 mg/dL (ref 0.3–1.2)
Total Protein: 6 g/dL — ABNORMAL LOW (ref 6.5–8.1)

## 2018-12-21 LAB — TYPE AND SCREEN
ABO/RH(D): O POS
Antibody Screen: NEGATIVE

## 2018-12-21 LAB — CBC WITH DIFFERENTIAL/PLATELET
Abs Immature Granulocytes: 0.04 10*3/uL (ref 0.00–0.07)
Basophils Absolute: 0 10*3/uL (ref 0.0–0.1)
Basophils Relative: 0 %
Eosinophils Absolute: 0.1 10*3/uL (ref 0.0–0.5)
Eosinophils Relative: 1 %
HCT: 25.8 % — ABNORMAL LOW (ref 39.0–52.0)
Hemoglobin: 8.2 g/dL — ABNORMAL LOW (ref 13.0–17.0)
Immature Granulocytes: 0 %
Lymphocytes Relative: 31 %
Lymphs Abs: 3.2 10*3/uL (ref 0.7–4.0)
MCH: 30.7 pg (ref 26.0–34.0)
MCHC: 31.8 g/dL (ref 30.0–36.0)
MCV: 96.6 fL (ref 80.0–100.0)
Monocytes Absolute: 1.6 10*3/uL — ABNORMAL HIGH (ref 0.1–1.0)
Monocytes Relative: 15 %
Neutro Abs: 5.6 10*3/uL (ref 1.7–7.7)
Neutrophils Relative %: 53 %
Platelets: 171 10*3/uL (ref 150–400)
RBC: 2.67 MIL/uL — ABNORMAL LOW (ref 4.22–5.81)
RDW: 12.7 % (ref 11.5–15.5)
WBC: 10.5 10*3/uL (ref 4.0–10.5)
nRBC: 0 % (ref 0.0–0.2)

## 2018-12-21 LAB — GLUCOSE, CAPILLARY
Glucose-Capillary: 85 mg/dL (ref 70–99)
Glucose-Capillary: 158 mg/dL — ABNORMAL HIGH (ref 70–99)
Glucose-Capillary: 255 mg/dL — ABNORMAL HIGH (ref 70–99)
Glucose-Capillary: 257 mg/dL — ABNORMAL HIGH (ref 70–99)

## 2018-12-21 LAB — C-REACTIVE PROTEIN: CRP: 2.4 mg/dL — ABNORMAL HIGH (ref <1.0)

## 2018-12-21 LAB — FERRITIN: Ferritin: 230 ng/mL (ref 24–336)

## 2018-12-21 LAB — D-DIMER, QUANTITATIVE: D-Dimer, Quant: 0.96 ug/mL-FEU — ABNORMAL HIGH (ref 0.00–0.50)

## 2018-12-21 MED ORDER — SODIUM CHLORIDE 0.9 % IV SOLN
200.0000 mg | Freq: Once | INTRAVENOUS | Status: AC
  Administered 2018-12-21: 200 mg via INTRAVENOUS
  Filled 2018-12-21: qty 40

## 2018-12-21 MED ORDER — METHYLPREDNISOLONE SODIUM SUCC 40 MG IJ SOLR
40.0000 mg | Freq: Two times a day (BID) | INTRAMUSCULAR | Status: DC
  Administered 2018-12-21 – 2018-12-24 (×7): 40 mg via INTRAVENOUS
  Filled 2018-12-21 (×7): qty 1

## 2018-12-21 MED ORDER — SODIUM CHLORIDE 0.9 % IV SOLN
100.0000 mg | INTRAVENOUS | Status: AC
  Administered 2018-12-22 – 2018-12-25 (×4): 100 mg via INTRAVENOUS
  Filled 2018-12-21 (×4): qty 20

## 2018-12-21 NOTE — Progress Notes (Signed)
Updated patients emergency contact on patient plan of care and status as well as answered all questions.

## 2018-12-21 NOTE — Progress Notes (Signed)
Remdesivir - Pharmacy Brief Note   O:  ALT: 27 CXR: Persistent patchy bilateral infiltrates SpO2: 99% on 3L [place]   A/P:  Patient meets criteria for remdesivir. Will initiate remdesivir 200 mg once followed by 100 mg daily x 4 days.   Gretta Arab PharmD, BCPS Clinical pharmacist phone 7am- 5pm: [phone] 12/21/2018 2:14 PM

## 2018-12-21 NOTE — Progress Notes (Signed)
Progress Note    [name] MOQ:[id number] DOB: 06-10-1946 DOA: 12/19/2018  PCP: Sande Brothers, MD   Patient coming from: [facility]   Chief Complaint: Fever   HPI: [name] is a 73 y.o. male with medical history significant for  Alzheimer dementia, history of CVA, coronary artery disease, type 2 diabetes mellitus, and hypertension, now presenting from his nursing facility for evaluation of fever.  Patient reported some abdominal "rumbling" and diarrhea, but denied any pain in his abdomen, and denied any other complaints in the ED.  History is limited by his chronic cognitive deficits and he is unable to say when the diarrhea began.  He denies any cough or shortness of breath.  Baby aspirin is on his medicine list, but no anticoagulants or NSAID, and no PPI or H2 blocker.  No endoscopy reports in EMR. Upon arrival to the ED, patient is found to be febrile to 38.6 C, saturating low 90s on room air, slightly tachypneic, slightly bradycardic, and with blood pressure 100/57.  EKG features a sinus rhythm and chest x-ray is notable for subtle opacity at the right mid to lower lung which could reflect atelectasis or developing infiltrate.  CT of the abdomen and pelvis reveals fluid-filled colon and small bowel with air-fluid levels and gaseous gastric distention consistent with gastroenteritis without obstruction, but with rectosigmoid colonic wall thickening which could represent focal proctocolitis.  Chemistry panel is notable for sodium of 133 and creatinine 1.55, up from 1.26 in November.  CBC features a mild leukocytosis and hemoglobin of 8.9, down from 10.5 in October. Lactic acid is reassuringly normal.  Fecal occult blood testing was positive.  Blood cultures were collected, 1 L of normal saline was administered, and the patient was treated with Rocephin, azithromycin, and IV Protonix in the ED, GI consulted by the ED physician and hospitalists asked to admit.  Assessment/Plan   Acute  hypoxic respiratory failure in the setting of COVID-19 infection pneumonia, RUL, POA, worsening -Patient stented initially with only fever and diarrhea, found to be positive to COVID-19 - not hypoxic at intake -Patient now hypoxic, requiring 2 to 3 L nasal cannula to maintain saturations; RUL infiltrate on CXR - not previously noted. -Initiate steroid treatment methylprednisolone 40 every 12; initiate remdesivir per protol -Patient continues to decline overt respiratory symptoms however given his baseline is alert to person only history is quite limited and somewhat unreliable -Covid labs somewhat stable - follow with am labs Recent Labs    12/20/18 0149 12/21/18 0446 12/21/18 0925  DDIMER 1.63*  --  0.96*  FERRITIN  --  230  --   LDH 221*  --   --   CRP 1.1* 2.4*  --    Asymptomatic anemia with questionable occult GI bleeding vs hemorrhoidal bleed, POA, stable  -Presents from SNF with fever and diarrhea, found to have Hgb of 8.9, down from 10.5 in October, and FOBT is positive - ?history of hemorrhoids per previous discussion -Abdominal exam benign, CT findings consistent with diarrheal state and possible proctocolitis  -Previously received protonix IV -Gastroenterology consulted by ED - no intervention indicated per that discussion  CAD, history of, stable - No anginal complaints  - Hold ASA while monitoring GIB    Insulin-dependent diabetes type 2, well controlled - A1c 6.3% in November 2019  - Managed with Lantus and metformin at SNF  -Continue sliding scale insulin, hypoglycemic protocol  Hypertension essential - BP was low initially, normalized with IVF, will hold antihypertensives; resume  as needed  History of CVA  -No focal deficits noted  -Hold ASA while monitoring GIB    Dementia, Alzheimer's - Alzheimer dementia, oriented to person and ambulates with walker at baseline  - Continue supportive care    AKI on ? CKD, POA, improving    -Cr 1.32 currently; 1.5 on  admission, baseline around 1.2 previously   Mild asymptomatic hyponatremia, resolved Na WNL now  PPE: CAPR, gown, gloves. Patient wearing mask.  DVT prophylaxis: SCD's - hold chemical ppx given fobt+ stool Code Status: Full  Family Communication: Sister, Fermon Ureta, updated by phone   Consults called: Gastroenterology consulted by ED physician - no longer following Admission status: Inpatient.  Patient now hypoxic, worsening chest x-ray, continues to require IV steroids, room to severe, close monitoring in the setting of COVID-19 pneumonia and worsening infection.  Subjective:  Difficult to obtain given patient's baseline mental status but acutely denies any shortness of breath, pain, issues eating or going to the bathroom  Past Medical History:  Diagnosis Date  . Coronary artery disease   . Diabetes mellitus    type II  . Gastritis   . Glaucoma    hx of  . Hypercholesterolemia   . Hypertension   . Insomnia   . Stroke [facility])    hx of multiple    Past Surgical History:  Procedure Laterality Date  . CATARACT EXTRACTION    . CORONARY ARTERY BYPASS GRAFT  2006   LIMA to LAD; SVG gto diagonal; SVG to ramus/L [facility])     reports that he has never smoked. He has never used smokeless tobacco. He reports that he does not drink alcohol or use drugs.  No Known Allergies  Family History  Problem Relation Age of Onset  . Diabetes Father 71  . Other Mother 65       alive     Prior to Admission medications   Medication Sig Start Date End Date Taking? Authorizing Provider  acetaminophen (TYLENOL) 325 MG tablet Take 650 mg by mouth 2 (two) times daily.    [provider]  Acetaminophen 325 MG CAPS Take 650 mg by mouth every 4 (four) hours as needed for mild pain.    [provider]  aspirin 81 MG chewable tablet Chew 81 mg by mouth daily.     [provider]  atorvastatin (LIPITOR) 40 MG tablet Take 40 mg by mouth at bedtime.     [provider]   carvedilol (COREG) 3.125 MG tablet Take 3.125 mg by mouth 2 (two) times daily.  02/27/18   [provider]  doxepin (SINEQUAN) 10 MG capsule Take 10 mg by mouth at bedtime.  02/27/18   [provider]  escitalopram (LEXAPRO) 10 MG tablet Take 10 mg by mouth daily.  02/27/18   [provider]  ferrous gluconate (FERGON) 324 MG tablet Take 324 mg by mouth daily.    [provider]  furosemide (LASIX) 40 MG tablet Take 40 mg by mouth daily.     [provider]  insulin glargine (LANTUS) 100 UNIT/ML injection Inject 5-19 Units into the skin See admin instructions. Inject 14 units every morning and 5 units every night at bedtime.    [provider]  lisinopril (PRINIVIL,ZESTRIL) 5 MG tablet Take 5 mg by mouth daily.  02/27/18   [provider]  metFORMIN (GLUCOPHAGE) 1000 MG tablet Take 1,000 mg by mouth 2 (two) times daily.  03/04/18   [provider]  Physical Exam: Vitals:   12/21/18 0600 12/21/18 0743 12/21/18 0945 12/21/18 1200  BP: (!) 124/57 (!) 120/53 (!) 139/52 (!) 124/52  Pulse: (!) 55 (!) 56 68 (!) 56  Resp: (!) 22 (!) 22  19  Temp: 99.1 F (37.3 C) 97.8 F (36.6 C)    TempSrc: Oral Oral    SpO2: 100% 95%  99%  Weight:      Height:        Constitutional: NAD, calm, non toxic, resting comfortably Eyes: PER, lids and conjunctivae normal ENMT: Mucous membranes are moist. Posterior pharynx clear of any exudate or lesions.   Neck: normal, supple, no masses, no thyromegaly Respiratory: no wheezing, no crackles. No accessory muscle use.  Cardiovascular: S1 & S2 heard, regular rate and rhythm. No extremity edema.   Abdomen: No distension, no tenderness, soft. Bowel sounds active.  Musculoskeletal: no clubbing / cyanosis. No joint deformity upper and lower extremities.  Skin: no significant rashes, lesions, ulcers. Warm, dry, well-perfused. Neurologic: No gross facial asymmetry. Sensation intact. Moving all  extremities.  Psychiatric: Alert and oriented to person only. Pleasant, cooperative.    Labs on Admission: I have personally reviewed following labs and imaging studies  CBC: Recent Labs  Lab 12/19/18 2231 12/20/18 0951 12/21/18 0446  WBC 10.7* 11.8* 10.5  NEUTROABS 5.9  --  5.6  HGB 8.9* 9.2* 8.2*  HCT 27.6* 27.5* 25.8*  MCV 95.8 94.5 96.6  PLT 179 150 300   Basic Metabolic Panel: Recent Labs  Lab 12/19/18 2231 12/21/18 0446  NA 133* 135  K 5.1 4.8  CL 100 103  CO2 24 25  GLUCOSE 87 82  BUN 25* 24*  CREATININE 1.55* 1.32*  CALCIUM 8.8* 8.5*   GFR: Estimated Creatinine Clearance: 53.9 mL/min (A) (by C-G formula based on SCr of 1.32 mg/dL (H)). Liver Function Tests: Recent Labs  Lab 12/19/18 2231 12/21/18 0446  AST 33 33  ALT 27 27  ALKPHOS 53 51  BILITOT 0.7 0.6  PROT 6.7 6.0*  ALBUMIN 3.3* 3.2*   Recent Labs  Lab 12/19/18 2337  LIPASE 32   No results for input(s): AMMONIA in the last 168 hours. Coagulation Profile: Recent Labs  Lab 12/19/18 2231  INR 1.1   Cardiac Enzymes: No results for input(s): CKTOTAL, CKMB, CKMBINDEX, TROPONINI in the last 168 hours. BNP (last 3 results) No results for input(s): PROBNP in the last 8760 hours. HbA1C: Recent Labs    12/20/18 0149  HGBA1C 6.7*   CBG: Recent Labs  Lab 12/20/18 1329 12/20/18 1618 12/20/18 2059 12/21/18 0746 12/21/18 1232  GLUCAP 121* 164* 94 85 158*   Lipid Profile: No results for input(s): CHOL, HDL, LDLCALC, TRIG, CHOLHDL, LDLDIRECT in the last 72 hours. Thyroid Function Tests: No results for input(s): TSH, T4TOTAL, FREET4, T3FREE, THYROIDAB in the last 72 hours. Anemia Panel: Recent Labs    12/21/18 0446  FERRITIN 230   Urine analysis:    Component Value Date/Time   COLORURINE YELLOW 12/20/2018 0202   APPEARANCEUR CLEAR 12/20/2018 0202   LABSPEC 1.014 12/20/2018 0202   PHURINE 5.0 12/20/2018 0202   GLUCOSEU NEGATIVE 12/20/2018 0202   GLUCOSEU NEGATIVE 05/05/2018 1438    HGBUR NEGATIVE 12/20/2018 0202   BILIRUBINUR NEGATIVE 12/20/2018 0202   KETONESUR NEGATIVE 12/20/2018 0202   PROTEINUR 100 (A) 12/20/2018 0202   UROBILINOGEN 0.2 05/05/2018 1438   NITRITE NEGATIVE 12/20/2018 0202   LEUKOCYTESUR NEGATIVE 12/20/2018 0202   Recent Results (from the past 240 hour(s))  Culture, blood (Routine  x 2)     Status: None (Preliminary result)   Collection Time: 12/19/18 10:31 PM   Specimen: BLOOD  Result Value Ref Range Status   Specimen Description BLOOD LEFT ARM  Final   Special Requests   Final    BOTTLES DRAWN AEROBIC AND ANAEROBIC Blood Culture adequate volume   Culture   Final    NO GROWTH < 24 HOURS Performed at [facility], [address] [place], [place] [address]    Report Status PENDING  Incomplete  SARS Coronavirus 2 (CEPHEID - Performed in [place] hospital lab), Hosp Order     Status: Abnormal   Collection Time: 12/19/18 10:40 PM   Specimen: Nasopharyngeal Swab  Result Value Ref Range Status   SARS Coronavirus 2 POSITIVE (A) NEGATIVE Final    Comment: RESULT CALLED TO, READ BACK BY AND VERIFIED WITH: T [facility] RN 12/19/18 2354 JDW (NOTE) If result is NEGATIVE SARS-CoV-2 target nucleic acids are NOT DETECTED. The SARS-CoV-2 RNA is generally detectable in upper and lower  respiratory specimens during the acute phase of infection. The lowest  concentration of SARS-CoV-2 viral copies this assay can detect is 250  copies / mL. A negative result does not preclude SARS-CoV-2 infection  and should not be used as the sole basis for treatment or other  patient management decisions.  A negative result may occur with  improper specimen collection / handling, submission of specimen other  than nasopharyngeal swab, presence of viral mutation(s) within the  areas targeted by this assay, and inadequate number of viral copies  (<250 copies / mL). A negative result must be combined with clinical  observations, patient history, and epidemiological  information. If result is POSITIVE SARS-CoV-2 target nucleic acids are DETECTED. The SARS -CoV-2 RNA is generally detectable in upper and lower  respiratory specimens during the acute phase of infection.  Positive  results are indicative of active infection with SARS-CoV-2.  Clinical  correlation with patient history and other diagnostic information is  necessary to determine patient infection status.  Positive results do  not rule out bacterial infection or co-infection with other viruses. If result is PRESUMPTIVE POSTIVE SARS-CoV-2 nucleic acids MAY BE PRESENT.   A presumptive positive result was obtained on the submitted specimen  and confirmed on repeat testing.  While 2019 novel coronavirus  (SARS-CoV-2) nucleic acids may be present in the submitted sample  additional confirmatory testing may be necessary for epidemiological  and / or clinical management purposes  to differentiate between  SARS-CoV-2 and other Sarbecovirus currently known to infect humans.  If clinically indicated additional testing with an alternate test  methodology [phone]) is advise d. The SARS-CoV-2 RNA is generally  detectable in upper and lower respiratory specimens during the acute  phase of infection. The expected result is Negative. Fact Sheet for Patients:  StrictlyIdeas.no Fact Sheet for Healthcare Providers: BankingDealers.co.za This test is not yet approved or cleared by the [place] FDA and has been authorized for detection and/or diagnosis of SARS-CoV-2 by FDA under an Emergency Use Authorization (EUA).  This EUA will remain in effect (meaning this test can be used) for the duration of the COVID-19 declaration under Section 564(b)(1) of the Act, 21 U.S.C. section 360bbb-3(b)(1), unless the authorization is terminated or revoked sooner. Performed at [facility], [place] [address], [place], [place] [address]      Radiological Exams on  Admission: Ct Abdomen Pelvis Wo Contrast  Result Date: 12/20/2018 CLINICAL DATA:  Acute abdominal  pain. COVID-19 positive. EXAM: CT ABDOMEN AND PELVIS WITHOUT CONTRAST TECHNIQUE: Multidetector CT imaging of the abdomen and pelvis was performed following the standard protocol without IV contrast. COMPARISON:  CT 04/28/2007 FINDINGS: Lower chest: Trace right pleural effusion. Right greater than left lower lobe atelectasis. There are coronary artery calcifications. Hepatobiliary: Multiple gallstones within physiologically distended gallbladder. No definite pericholecystic inflammation. No biliary dilatation. Probable hepatic arterial calcifications. No focal lesion on noncontrast exam. Pancreas: No ductal dilatation or inflammation. Spleen: Normal in size without focal abnormality. Adrenals/Urinary Tract: Small left adrenal adenoma. No right adrenal nodule. Symmetric bilateral perinephric edema. No hydronephrosis. No urolithiasis. Urinary bladder is physiologically distended. No bladder wall thickening. Stomach/Bowel: Small hiatal hernia. Gaseous gastric distension without wall thickening. Air-fluid levels within slightly distended distal small bowel, no definite wall thickening or adjacent inflammation. Fluid/liquid stool throughout the colon with few scattered air-fluid levels. Minor distal colonic diverticulosis without diverticulitis. Formed stool in the rectosigmoid colon with moderate rectosigmoid wall thickening. Mild adjacent perirectal stranding. Normal appendix tentatively visualized. Vascular/Lymphatic: Advanced aortic and branch atherosclerosis. No aneurysm. No bulky abdominopelvic adenopathy. Reproductive: Prominent prostate gland spanning 5.2 cm transverse. Other: No free air. No significant free fluid. Musculoskeletal: Bones are under mineralized. Rule out bilateral inferior pubic rami fractures. Remote right superior pubic ramus fracture. There are no acute or suspicious osseous abnormalities.  IMPRESSION: 1. Fluid-filled colon and small bowel with air-fluid levels, as well as gaseous gastric distension, consistent with gastroenteritis/diarrheal process. No obstruction. 2. Rectosigmoid colonic wall thickening with mild pericolonic edema, likely focal proctocolitis. 3. Cholelithiasis without gallbladder inflammation. 4. Advanced aortic and branch atherosclerosis. Aortic Atherosclerosis (ICD10-I70.0). 5. Trace right pleural effusion. Electronically Signed   By: Keith Rake M.D.   On: 12/20/2018 00:39   Dg Chest Port 1 View  Result Date: 12/21/2018 CLINICAL DATA:  Follow-up coronavirus pneumonia EXAM: PORTABLE CHEST 1 VIEW COMPARISON:  12/19/2018 FINDINGS: Patchy areas of bilateral pneumonia persists, most pronounced in the right perihilar lung. Allowing for technical factors, there is slightly worsened consolidation particularly in the right perihilar region. No lobar involvement. No lobar collapse. No effusion. Previous CABG with cardiomegaly IMPRESSION: Persistent patchy bilateral infiltrates, most pronounced in the right perihilar region, where the pulmonary density is somewhat worsened. Electronically Signed   By: Nelson Chimes M.D.   On: 12/21/2018 08:08   Dg Chest Portable 1 View  Result Date: 12/19/2018 CLINICAL DATA:  Suspected sepsis. EXAM: PORTABLE CHEST 1 VIEW COMPARISON:  March 31, 2018 FINDINGS: Probable skin fold over the right lung base. Stable cardiomegaly. The hila and mediastinum are unremarkable. No pneumothorax. The left lung is clear. Subtle opacity in the left mid lower lung is identified. No other acute abnormalities. IMPRESSION: Subtle opacity in the right mid lower lung may represent atelectasis or developing infiltrate. No other acute abnormalities. Electronically Signed   By: Dorise Bullion III M.D   On: 12/19/2018 22:45    EKG: Independently reviewed. Sinus rhythm, QTc 424 ms.   Little Ishikawa, MD [facility] Pager [phone]  If 7PM-7AM, please  contact night-coverage [email or web address] Password [email or web address]  12/21/2018, 1:32 PM

## 2018-12-21 NOTE — Progress Notes (Signed)
Called patient sister Airam Heidecker this morning with an update. She stated that he is on a soft diet, thin liquids at the SNF, this was ordered for him per MD order this morning. I also asked if she had any medication information with her because pharmacy could not reach anyone at [facility] to verify his meds. She does not have his list as she is out of town. I assured her that he is getting everything he needs and we will follow up with [facility] about his meds. Will continue to monitor patient.

## 2018-12-21 NOTE — Progress Notes (Signed)
Spoke with patient contact and updated her on patient status as well as plan of care.

## 2018-12-22 LAB — FERRITIN: Ferritin: 300 ng/mL (ref 24–336)

## 2018-12-22 LAB — GLUCOSE, CAPILLARY
Glucose-Capillary: 275 mg/dL — ABNORMAL HIGH (ref 70–99)
Glucose-Capillary: 320 mg/dL — ABNORMAL HIGH (ref 70–99)
Glucose-Capillary: 361 mg/dL — ABNORMAL HIGH (ref 70–99)
Glucose-Capillary: 327 mg/dL — ABNORMAL HIGH (ref 70–99)

## 2018-12-22 LAB — CBC WITH DIFFERENTIAL/PLATELET
Abs Immature Granulocytes: 0.02 10*3/uL (ref 0.00–0.07)
Basophils Absolute: 0 10*3/uL (ref 0.0–0.1)
Basophils Relative: 0 %
Eosinophils Absolute: 0 10*3/uL (ref 0.0–0.5)
Eosinophils Relative: 0 %
HCT: 28.3 % — ABNORMAL LOW (ref 39.0–52.0)
Hemoglobin: 9 g/dL — ABNORMAL LOW (ref 13.0–17.0)
Immature Granulocytes: 0 %
Lymphocytes Relative: 17 %
Lymphs Abs: 1.2 10*3/uL (ref 0.7–4.0)
MCH: 30.4 pg (ref 26.0–34.0)
MCHC: 31.8 g/dL (ref 30.0–36.0)
MCV: 95.6 fL (ref 80.0–100.0)
Monocytes Absolute: 0.4 10*3/uL (ref 0.1–1.0)
Monocytes Relative: 5 %
Neutro Abs: 5.5 10*3/uL (ref 1.7–7.7)
Neutrophils Relative %: 78 %
Platelets: 180 10*3/uL (ref 150–400)
RBC: 2.96 MIL/uL — ABNORMAL LOW (ref 4.22–5.81)
RDW: 12.5 % (ref 11.5–15.5)
WBC: 7.1 10*3/uL (ref 4.0–10.5)
nRBC: 0 % (ref 0.0–0.2)

## 2018-12-22 LAB — COMPREHENSIVE METABOLIC PANEL
ALT: 50 U/L — ABNORMAL HIGH (ref 0–44)
AST: 49 U/L — ABNORMAL HIGH (ref 15–41)
Albumin: 3.2 g/dL — ABNORMAL LOW (ref 3.5–5.0)
Alkaline Phosphatase: 65 U/L (ref 38–126)
Anion gap: 10 (ref 5–15)
BUN: 30 mg/dL — ABNORMAL HIGH (ref 8–23)
CO2: 23 mmol/L (ref 22–32)
Calcium: 8.8 mg/dL — ABNORMAL LOW (ref 8.9–10.3)
Chloride: 101 mmol/L (ref 98–111)
Creatinine, Ser: 1.38 mg/dL — ABNORMAL HIGH (ref 0.61–1.24)
GFR calc Af Amer: 59 mL/min — ABNORMAL LOW (ref >60)
GFR calc non Af Amer: 51 mL/min — ABNORMAL LOW (ref >60)
Glucose, Bld: 265 mg/dL — ABNORMAL HIGH (ref 70–99)
Potassium: 5.4 mmol/L — ABNORMAL HIGH (ref 3.5–5.1)
Sodium: 134 mmol/L — ABNORMAL LOW (ref 135–145)
Total Bilirubin: 0.4 mg/dL (ref 0.3–1.2)
Total Protein: 6.8 g/dL (ref 6.5–8.1)

## 2018-12-22 LAB — OCCULT BLOOD, POC DEVICE: Fecal Occult Bld: POSITIVE — AB

## 2018-12-22 LAB — LACTATE DEHYDROGENASE: LDH: 212 U/L — ABNORMAL HIGH (ref 98–192)

## 2018-12-22 LAB — INTERLEUKIN-6, PLASMA: Interleukin-6, Plasma: 36 pg/mL — ABNORMAL HIGH (ref 0.0–12.2)

## 2018-12-22 LAB — C-REACTIVE PROTEIN: CRP: 4.6 mg/dL — ABNORMAL HIGH (ref <1.0)

## 2018-12-22 LAB — D-DIMER, QUANTITATIVE: D-Dimer, Quant: 1.03 ug/mL-FEU — ABNORMAL HIGH (ref 0.00–0.50)

## 2018-12-22 MED ORDER — DIVALPROEX SODIUM 125 MG PO CSDR
125.0000 mg | DELAYED_RELEASE_CAPSULE | Freq: Two times a day (BID) | ORAL | Status: DC
  Administered 2018-12-22 – 2018-12-26 (×8): 125 mg via ORAL
  Filled 2018-12-22 (×8): qty 1

## 2018-12-22 MED ORDER — INSULIN ASPART 100 UNIT/ML ~~LOC~~ SOLN
5.0000 [IU] | Freq: Once | SUBCUTANEOUS | Status: AC
  Administered 2018-12-22: 5 [IU] via SUBCUTANEOUS

## 2018-12-22 MED ORDER — INSULIN GLARGINE 100 UNIT/ML ~~LOC~~ SOLN
10.0000 [IU] | Freq: Every morning | SUBCUTANEOUS | Status: DC
  Administered 2018-12-22 – 2018-12-24 (×3): 10 [IU] via SUBCUTANEOUS
  Filled 2018-12-22 (×3): qty 0.1

## 2018-12-22 NOTE — Progress Notes (Addendum)
Progress Note    [name] LYY:[id number] DOB: [birth year] DOA: 12/19/2018  PCP: Sande Brothers, MD   Patient coming from: [facility]   Chief Complaint: Fever   HPI: [name] is a 73 y.o. male with medical history significant for  Alzheimer dementia, history of CVA, coronary artery disease, type 2 diabetes mellitus, and hypertension, now presenting from his nursing facility for evaluation of fever.  Patient reported some abdominal "rumbling" and diarrhea, but denied any pain in his abdomen, and denied any other complaints in the ED.  History is limited by his chronic cognitive deficits and he is unable to say when the diarrhea began.  He denies any cough or shortness of breath.  Baby aspirin is on his medicine list, but no anticoagulants or NSAID, and no PPI or H2 blocker.  No endoscopy reports in EMR. Upon arrival to the ED, patient is found to be febrile to 38.6 C, saturating low 90s on room air, slightly tachypneic, slightly bradycardic, and with blood pressure 100/57.  EKG features a sinus rhythm and chest x-ray is notable for subtle opacity at the right mid to lower lung which could reflect atelectasis or developing infiltrate.  CT of the abdomen and pelvis reveals fluid-filled colon and small bowel with air-fluid levels and gaseous gastric distention consistent with gastroenteritis without obstruction, but with rectosigmoid colonic wall thickening which could represent focal proctocolitis.  Chemistry panel is notable for sodium of 133 and creatinine 1.55, up from 1.26 in November.  CBC features a mild leukocytosis and hemoglobin of 8.9, down from 10.5 in October. Lactic acid is reassuringly normal.  Fecal occult blood testing was positive.  Blood cultures were collected, 1 L of normal saline was administered, and the patient was treated with Rocephin, azithromycin, and IV Protonix in the ED, GI consulted by the ED physician and hospitalists asked to admit.  Assessment/Plan   Acute  hypoxic respiratory failure in the setting of COVID-19 infection pneumonia, RUL, POA, improving -Patient admitted initially with only fever and diarrhea, found to be positive to COVID-19 - not hypoxic at intake -Patient now hypoxic, but improving on 1 L nasal cannula, previously requiring 2 to 3 L nasal cannula to maintain saturations; RUL infiltrate on CXR -Continue steroids/remdesivir per protocol -Patient continues to decline overt respiratory symptoms however given his baseline is alert to person only history is quite limited and somewhat unreliable -Methylprednisolone 7/5-->ongoing -Remdesivir 7/5-->ongoing -Covid labs somewhat stable: Recent Labs    12/20/18 0149 12/21/18 0446 12/21/18 0925 12/22/18 0427  DDIMER 1.63*  --  0.96* 1.03*  FERRITIN  --  230  --  300  LDH 221*  --   --  212*  CRP 1.1* 2.4*  --  4.6*   Asymptomatic anemia with questionable occult GI bleeding vs hemorrhoidal bleed, POA, stable  -Presents from SNF with fever and diarrhea, found to have Hgb of 8.9, down from 10.5 in October, and FOBT is positive - ?history of hemorrhoids per previous discussion -Abdominal exam benign, CT findings consistent with diarrheal state and possible proctocolitis  -Previously received protonix IV -Gastroenterology consulted by ED - no intervention indicated per that discussion  CAD, history of, stable - No anginal complaints  - Hold ASA while monitoring ?GIB    Insulin-dependent diabetes type 2, well controlled - A1c 6.3% in November 2019  - Managed with Lantus and metformin at SNF  -Continue sliding scale insulin, hypoglycemic protocol  Hypertension essential - BP was low initially, normalized with IVF, will hold  antihypertensives; resume as needed  History of CVA  -No focal deficits noted  -Hold ASA while monitoring GIB    Dementia, Alzheimer's - Alzheimer dementia, oriented to person and ambulates with walker at baseline  - Continue supportive care    AKI on ?  CKD, POA, improving    -Cr 1.32 currently; 1.5 on admission, baseline around 1.2 previously   Mild asymptomatic hyponatremia, resolved Na WNL now  PPE: CAPR, gown, gloves. Patient wearing mask.  DVT prophylaxis: SCD's - hold chemical ppx given fobt+ stool Code Status: Full  Family Communication: Sister, [name], updated by phone   Consults called: Gastroenterology consulted by ED physician - no longer following Disposition: Patient currently remains inpatient, continues to require supplemental oxygen although improving as above with Remdesivir.  As patient improves clinically, once completes course Remdesivir would consider disposition, patient previously resides at facility, will discuss with case management if it is feasible for patient to return to facility or if he will need to be transition to other facility given patient is COVID-19 positive.  Subjective:  Difficult to obtain given patient's baseline mental status but acutely denies any shortness of breath, pain, issues eating or going to the bathroom  Past Medical History:  Diagnosis Date  . Coronary artery disease   . Diabetes mellitus    type II  . Gastritis   . Glaucoma    hx of  . Hypercholesterolemia   . Hypertension   . Insomnia   . Stroke [facility])    hx of multiple    Past Surgical History:  Procedure Laterality Date  . CATARACT EXTRACTION    . CORONARY ARTERY BYPASS GRAFT  2006   LIMA to LAD; SVG gto diagonal; SVG to ramus/L [facility])     reports that he has never smoked. He has never used smokeless tobacco. He reports that he does not drink alcohol or use drugs.  No Known Allergies  Family History  Problem Relation Age of Onset  . Diabetes Father 37  . Other Mother 68       alive     Prior to Admission medications   Medication Sig Start Date End Date Taking? Authorizing Provider  acetaminophen (TYLENOL) 325 MG tablet Take 650 mg by mouth 2 (two) times daily.    [provider]  Acetaminophen  325 MG CAPS Take 650 mg by mouth every 4 (four) hours as needed for mild pain.    [provider]  aspirin 81 MG chewable tablet Chew 81 mg by mouth daily.     [provider]  atorvastatin (LIPITOR) 40 MG tablet Take 40 mg by mouth at bedtime.     [provider]  carvedilol (COREG) 3.125 MG tablet Take 3.125 mg by mouth 2 (two) times daily.  02/27/18   [provider]  doxepin (SINEQUAN) 10 MG capsule Take 10 mg by mouth at bedtime.  02/27/18   [provider]  escitalopram (LEXAPRO) 10 MG tablet Take 10 mg by mouth daily.  02/27/18   [provider]  ferrous gluconate (FERGON) 324 MG tablet Take 324 mg by mouth daily.    [provider]  furosemide (LASIX) 40 MG tablet Take 40 mg by mouth daily.     [provider]  insulin glargine (LANTUS) 100 UNIT/ML injection Inject 5-19 Units into the skin See admin instructions. Inject 14 units every morning and 5 units every night at bedtime.    [provider]  lisinopril (PRINIVIL,ZESTRIL) 5 MG tablet  Take 5 mg by mouth daily.  02/27/18   [provider]  metFORMIN (GLUCOPHAGE) 1000 MG tablet Take 1,000 mg by mouth 2 (two) times daily.  03/04/18   [provider]    Physical Exam: Vitals:   12/22/18 0500 12/22/18 0700 12/22/18 0800 12/22/18 0835  BP:    (!) 155/65  Pulse: 61 60 73 63  Resp:      Temp:    97.6 F (36.4 C)  TempSrc:    Axillary  SpO2: 96% 98% 98% 99%  Weight:      Height:        Constitutional: NAD, calm, non toxic, resting comfortably, pleasantly demented Eyes: PER, lids and conjunctivae normal ENMT: Mucous membranes are moist. Posterior pharynx clear of any exudate or lesions.   Neck: normal, supple, no masses, no thyromegaly Respiratory: no wheezing, no crackles. No accessory muscle use.  Cardiovascular: S1 & S2 heard, regular rate and rhythm. No extremity edema.   Abdomen: No distension, no tenderness, soft. Bowel sounds  active.  Musculoskeletal: no clubbing / cyanosis. No joint deformity upper and lower extremities.  Skin: no significant rashes, lesions, ulcers. Warm, dry, well-perfused. Neurologic: No gross facial asymmetry. Sensation intact. Moving all extremities.  Psychiatric: Alert and oriented to person only. Pleasant, cooperative.   Labs on Admission: I have personally reviewed following labs and imaging studies  CBC: Recent Labs  Lab 12/19/18 2231 12/20/18 0951 12/21/18 0446 12/22/18 0427  WBC 10.7* 11.8* 10.5 7.1  NEUTROABS 5.9  --  5.6 5.5  HGB 8.9* 9.2* 8.2* 9.0*  HCT 27.6* 27.5* 25.8* 28.3*  MCV 95.8 94.5 96.6 95.6  PLT 179 150 171 381   Basic Metabolic Panel: Recent Labs  Lab 12/19/18 2231 12/21/18 0446 12/22/18 0427  NA 133* 135 134*  K 5.1 4.8 5.4*  CL 100 103 101  CO2 24 25 23   GLUCOSE 87 82 265*  BUN 25* 24* 30*  CREATININE 1.55* 1.32* 1.38*  CALCIUM 8.8* 8.5* 8.8*   GFR: Estimated Creatinine Clearance: 51.5 mL/min (A) (by C-G formula based on SCr of 1.38 mg/dL (H)). Liver Function Tests: Recent Labs  Lab 12/19/18 2231 12/21/18 0446 12/22/18 0427  AST 33 33 49*  ALT 27 27 50*  ALKPHOS 53 51 65  BILITOT 0.7 0.6 0.4  PROT 6.7 6.0* 6.8  ALBUMIN 3.3* 3.2* 3.2*   Recent Labs  Lab 12/19/18 2337  LIPASE 32   No results for input(s): AMMONIA in the last 168 hours. Coagulation Profile: Recent Labs  Lab 12/19/18 2231  INR 1.1   Cardiac Enzymes: No results for input(s): CKTOTAL, CKMB, CKMBINDEX, TROPONINI in the last 168 hours. BNP (last 3 results) No results for input(s): PROBNP in the last 8760 hours. HbA1C: Recent Labs    12/20/18 0149  HGBA1C 6.7*   CBG: Recent Labs  Lab 12/21/18 0746 12/21/18 1232 12/21/18 1738 12/21/18 2136 12/22/18 0831  GLUCAP 85 158* 255* 257* 275*   Lipid Profile: No results for input(s): CHOL, HDL, LDLCALC, TRIG, CHOLHDL, LDLDIRECT in the last 72 hours. Thyroid Function Tests: No results for input(s): TSH,  T4TOTAL, FREET4, T3FREE, THYROIDAB in the last 72 hours. Anemia Panel: Recent Labs    12/21/18 0446 12/22/18 0427  FERRITIN 230 300   Urine analysis:    Component Value Date/Time   COLORURINE YELLOW 12/20/2018 0202   APPEARANCEUR CLEAR 12/20/2018 0202   LABSPEC 1.014 12/20/2018 0202   PHURINE 5.0 12/20/2018 0202   GLUCOSEU NEGATIVE 12/20/2018 0202   GLUCOSEU NEGATIVE 05/05/2018  [place] 12/20/2018 0202   BILIRUBINUR NEGATIVE 12/20/2018 0202   KETONESUR NEGATIVE 12/20/2018 0202   PROTEINUR 100 (A) 12/20/2018 0202   UROBILINOGEN 0.2 05/05/2018 1438   NITRITE NEGATIVE 12/20/2018 0202   LEUKOCYTESUR NEGATIVE 12/20/2018 0202   Recent Results (from the past 240 hour(s))  Culture, blood (Routine x 2)     Status: None (Preliminary result)   Collection Time: 12/19/18 10:31 PM   Specimen: BLOOD  Result Value Ref Range Status   Specimen Description BLOOD LEFT ARM  Final   Special Requests   Final    BOTTLES DRAWN AEROBIC AND ANAEROBIC Blood Culture adequate volume   Culture   Final    NO GROWTH 2 DAYS Performed at [facility], [place] [address] [place], [place] [address]    Report Status PENDING  Incomplete  SARS Coronavirus 2 (CEPHEID - Performed in [place] hospital lab), Hosp Order     Status: Abnormal   Collection Time: 12/19/18 10:40 PM   Specimen: Nasopharyngeal Swab  Result Value Ref Range Status   SARS Coronavirus 2 POSITIVE (A) NEGATIVE Final    Comment: RESULT CALLED TO, READ BACK BY AND VERIFIED WITH: T [facility] RN 12/19/18 2354 JDW (NOTE) If result is NEGATIVE SARS-CoV-2 target nucleic acids are NOT DETECTED. The SARS-CoV-2 RNA is generally detectable in upper and lower  respiratory specimens during the acute phase of infection. The lowest  concentration of SARS-CoV-2 viral copies this assay can detect is 250  copies / mL. A negative result does not preclude SARS-CoV-2 infection  and should not be used as the sole basis for treatment or other   patient management decisions.  A negative result may occur with  improper specimen collection / handling, submission of specimen other  than nasopharyngeal swab, presence of viral mutation(s) within the  areas targeted by this assay, and inadequate number of viral copies  (<250 copies / mL). A negative result must be combined with clinical  observations, patient history, and epidemiological information. If result is POSITIVE SARS-CoV-2 target nucleic acids are DETECTED. The SARS -CoV-2 RNA is generally detectable in upper and lower  respiratory specimens during the acute phase of infection.  Positive  results are indicative of active infection with SARS-CoV-2.  Clinical  correlation with patient history and other diagnostic information is  necessary to determine patient infection status.  Positive results do  not rule out bacterial infection or co-infection with other viruses. If result is PRESUMPTIVE POSTIVE SARS-CoV-2 nucleic acids MAY BE PRESENT.   A presumptive positive result was obtained on the submitted specimen  and confirmed on repeat testing.  While 2019 novel coronavirus  (SARS-CoV-2) nucleic acids may be present in the submitted sample  additional confirmatory testing may be necessary for epidemiological  and / or clinical management purposes  to differentiate between  SARS-CoV-2 and other Sarbecovirus currently known to infect humans.  If clinically indicated additional testing with an alternate test  methodology [phone]) is advise d. The SARS-CoV-2 RNA is generally  detectable in upper and lower respiratory specimens during the acute  phase of infection. The expected result is Negative. Fact Sheet for Patients:  StrictlyIdeas.no Fact Sheet for Healthcare Providers: BankingDealers.co.za This test is not yet approved or cleared by the [place] FDA and has been authorized for detection and/or diagnosis of SARS-CoV-2 by  FDA under an Emergency Use Authorization (EUA).  This EUA will remain in effect (meaning this test can be used) for the duration of the  COVID-19 declaration under Section 564(b)(1) of the Act, 21 U.S.C. section 360bbb-3(b)(1), unless the authorization is terminated or revoked sooner. Performed at [facility], [place] [address] [place], [place] [address]   Culture, blood (Routine x 2)     Status: None (Preliminary result)   Collection Time: 12/20/18  2:00 AM   Specimen: BLOOD  Result Value Ref Range Status   Specimen Description BLOOD RIGHT ARM  Final   Special Requests   Final    BOTTLES DRAWN AEROBIC AND ANAEROBIC Blood Culture adequate volume   Culture   Final    NO GROWTH 1 DAY Performed at [facility], [place] [address] [place], [place] [address]    Report Status PENDING  Incomplete     Radiological Exams on Admission: Dg Chest Port 1 View  Result Date: 12/21/2018 CLINICAL DATA:  Follow-up coronavirus pneumonia EXAM: PORTABLE CHEST 1 VIEW COMPARISON:  12/19/2018 FINDINGS: Patchy areas of bilateral pneumonia persists, most pronounced in the right perihilar lung. Allowing for technical factors, there is slightly worsened consolidation particularly in the right perihilar region. No lobar involvement. No lobar collapse. No effusion. Previous CABG with cardiomegaly IMPRESSION: Persistent patchy bilateral infiltrates, most pronounced in the right perihilar region, where the pulmonary density is somewhat worsened. Electronically Signed   By: Nelson Chimes M.D.   On: 12/21/2018 08:08    Little Ishikawa, MD [facility] Pager [phone]  If 7PM-7AM, please contact night-coverage [email or web address] Password [email or web address]  12/22/2018, 11:19 AM

## 2018-12-22 NOTE — Progress Notes (Signed)
Spoke with pts sister, updated on pt status.  Pt currently watching TV in bed.  No longer requiring oxygen.

## 2018-12-22 NOTE — Progress Notes (Signed)
CSW acknowledges SNF consult, pending PT/OT recs.   Marcellas Marchant, LCSW [phone]  

## 2018-12-22 NOTE — Progress Notes (Signed)
Inpatient Diabetes Program Recommendations  AACE/ADA: New Consensus Statement on Inpatient Glycemic Control (2015)  Target Ranges:  Prepandial:   less than 140 mg/dL      Peak postprandial:   less than 180 mg/dL (1-2 hours)      Critically ill patients:  140 - 180 mg/dL   Lab Results  Component Value Date   GLUCAP 320 (H) 12/22/2018   HGBA1C 6.7 (H) 12/20/2018    Review of Glycemic Control Results for ALOYSIUS, [name] (MRN [id number]) as of 12/22/2018 14:24  Ref. Range 12/21/2018 12:32 12/21/2018 17:38 12/21/2018 21:36 12/22/2018 08:31 12/22/2018 11:56  Glucose-Capillary Latest Ref Range: 70 - 99 mg/dL 158 (H) 255 (H) 257 (H) 275 (H) 320 (H)   Diabetes history: DM2 Outpatient Diabetes medications: Lantus 11 units  = Metformin 1 gm bid Current orders for Inpatient glycemic control: Novolog sensitive correction tid + hs 0-5 units  Inpatient Diabetes Program Recommendations:   Noted CBGs currently > 300 -Restart Lantus 10 units daily while on steroids Patient sees Dr. Dwyane Dee for endocrinology and last office visit was 06/05/18  Thank you, [name]. Zilah Villaflor, RN, MSN, CDE  Diabetes Coordinator Inpatient Glycemic Control Team Team Pager [phone] (8am-5pm) 12/22/2018 2:29 PM

## 2018-12-23 LAB — C-REACTIVE PROTEIN: CRP: 2.5 mg/dL — ABNORMAL HIGH (ref <1.0)

## 2018-12-23 LAB — COMPREHENSIVE METABOLIC PANEL
ALT: 58 U/L — ABNORMAL HIGH (ref 0–44)
AST: 49 U/L — ABNORMAL HIGH (ref 15–41)
Albumin: 3.2 g/dL — ABNORMAL LOW (ref 3.5–5.0)
Alkaline Phosphatase: 65 U/L (ref 38–126)
Anion gap: 9 (ref 5–15)
BUN: 40 mg/dL — ABNORMAL HIGH (ref 8–23)
CO2: 24 mmol/L (ref 22–32)
Calcium: 9 mg/dL (ref 8.9–10.3)
Chloride: 103 mmol/L (ref 98–111)
Creatinine, Ser: 1.33 mg/dL — ABNORMAL HIGH (ref 0.61–1.24)
GFR calc Af Amer: >60 mL/min (ref >60)
GFR calc non Af Amer: 53 mL/min — ABNORMAL LOW (ref >60)
Glucose, Bld: 213 mg/dL — ABNORMAL HIGH (ref 70–99)
Potassium: 5.4 mmol/L — ABNORMAL HIGH (ref 3.5–5.1)
Sodium: 136 mmol/L (ref 135–145)
Total Bilirubin: 0.5 mg/dL (ref 0.3–1.2)
Total Protein: 6.8 g/dL (ref 6.5–8.1)

## 2018-12-23 LAB — GLUCOSE, CAPILLARY
Glucose-Capillary: 313 mg/dL — ABNORMAL HIGH (ref 70–99)
Glucose-Capillary: 332 mg/dL — ABNORMAL HIGH (ref 70–99)
Glucose-Capillary: 266 mg/dL — ABNORMAL HIGH (ref 70–99)
Glucose-Capillary: 203 mg/dL — ABNORMAL HIGH (ref 70–99)

## 2018-12-23 LAB — CBC WITH DIFFERENTIAL/PLATELET
Abs Immature Granulocytes: 0.12 10*3/uL — ABNORMAL HIGH (ref 0.00–0.07)
Basophils Absolute: 0 10*3/uL (ref 0.0–0.1)
Basophils Relative: 0 %
Eosinophils Absolute: 0 10*3/uL (ref 0.0–0.5)
Eosinophils Relative: 0 %
HCT: 28 % — ABNORMAL LOW (ref 39.0–52.0)
Hemoglobin: 9.3 g/dL — ABNORMAL LOW (ref 13.0–17.0)
Immature Granulocytes: 1 %
Lymphocytes Relative: 10 %
Lymphs Abs: 1.4 10*3/uL (ref 0.7–4.0)
MCH: 31.2 pg (ref 26.0–34.0)
MCHC: 33.2 g/dL (ref 30.0–36.0)
MCV: 94 fL (ref 80.0–100.0)
Monocytes Absolute: 0.9 10*3/uL (ref 0.1–1.0)
Monocytes Relative: 6 %
Neutro Abs: 11.1 10*3/uL — ABNORMAL HIGH (ref 1.7–7.7)
Neutrophils Relative %: 83 %
Platelets: 187 10*3/uL (ref 150–400)
RBC: 2.98 MIL/uL — ABNORMAL LOW (ref 4.22–5.81)
RDW: 12.4 % (ref 11.5–15.5)
WBC: 13.5 10*3/uL — ABNORMAL HIGH (ref 4.0–10.5)
nRBC: 0 % (ref 0.0–0.2)

## 2018-12-23 LAB — D-DIMER, QUANTITATIVE: D-Dimer, Quant: 1.2 ug/mL-FEU — ABNORMAL HIGH (ref 0.00–0.50)

## 2018-12-23 LAB — FERRITIN: Ferritin: 290 ng/mL (ref 24–336)

## 2018-12-23 LAB — LACTATE DEHYDROGENASE: LDH: 217 U/L — ABNORMAL HIGH (ref 98–192)

## 2018-12-23 MED ORDER — INSULIN ASPART 100 UNIT/ML ~~LOC~~ SOLN: 0.0000 [IU] | Freq: Three times a day (TID) | SUBCUTANEOUS | Status: DC

## 2018-12-23 MED ORDER — INSULIN ASPART 100 UNIT/ML ~~LOC~~ SOLN
0.0000 [IU] | Freq: Three times a day (TID) | SUBCUTANEOUS | Status: DC
  Administered 2018-12-23: 8 [IU] via SUBCUTANEOUS
  Administered 2018-12-23: 11 [IU] via SUBCUTANEOUS
  Administered 2018-12-24: 15 [IU] via SUBCUTANEOUS
  Administered 2018-12-24 – 2018-12-25 (×2): 8 [IU] via SUBCUTANEOUS
  Administered 2018-12-25: 3 [IU] via SUBCUTANEOUS
  Administered 2018-12-25: 8 [IU] via SUBCUTANEOUS
  Administered 2018-12-26 (×2): 3 [IU] via SUBCUTANEOUS

## 2018-12-23 NOTE — Progress Notes (Addendum)
Inpatient Diabetes Program Recommendations  AACE/ADA: New Consensus Statement on Inpatient Glycemic Control (2015)  Target Ranges:  Prepandial:   less than 140 mg/dL      Peak postprandial:   less than 180 mg/dL (1-2 hours)      Critically ill patients:  140 - 180 mg/dL   Lab Results  Component Value Date   GLUCAP 332 (H) 12/23/2018   HGBA1C 6.7 (H) 12/20/2018    Review of Glycemic Control Results for MAYO, FAULK (MRN [id number]) as of 12/23/2018 13:40  Ref. Range 12/22/2018 11:56 12/22/2018 17:44 12/22/2018 20:56 12/23/2018 08:24 12/23/2018 12:01  Glucose-Capillary Latest Ref Range: 70 - 99 mg/dL 320 (H) 361 (H) 327 (H) 313 (H) 332 (H)   Diabetes history: DM2 Outpatient Diabetes medications: Lantus 11 units  = Metformin 1 gm bid Current orders for Inpatient glycemic control: Lantus 10 units + Novolog moderate correction tid + hs 0-5 units  Inpatient Diabetes Program Recommendations:    CBGs running >300. -Add Novolog 3 units tid meal coverage if eats   Thank you, Bethena Roys E. Selen Smucker, RN, MSN, CDE  Diabetes Coordinator Inpatient Glycemic Control Team Team Pager [phone] (8am-5pm) 12/23/2018 1:44 PM

## 2018-12-23 NOTE — Evaluation (Addendum)
Physical Therapy Evaluation Patient Details Name: [name] MRN: [id number] DOB: [birth year] Today's Date: 12/23/2018   History of Present Illness  [name] is a 73 y.o. male with medical history significant for  Alzheimer dementia, history of CVA, coronary artery disease, type 2 diabetes mellitus, and hypertension, now presenting from his nursing facility for evaluation of fever, positive for covid-19, gastritis, hypoxia..  Clinical Impression  The patient is very pleasant. Ambulated with 1 assist for balance x 40' using RW. Unsure if patient was mod Independent at facility PTA. Pt admitted with above diagnosis. Pt currently with functional limitations due to the deficits listed below (see PT Problem List).  Pt will benefit from skilled PT to increase their independence and safety with mobility to allow discharge to the venue listed below.       Follow Up Recommendations SNF    Equipment Recommendations  None recommended by PT    Recommendations for Other Services       Precautions / Restrictions Precautions Precautions: Fall      Mobility  Bed Mobility Overal bed mobility: Needs Assistance Bed Mobility: Supine to Sit     Supine to sit: Min guard;HOB elevated     General bed mobility comments: Min Guard A for safety and HOB elevate to assist with trunk  Transfers Overall transfer level: Needs assistance Equipment used: Rolling walker (2 wheeled) Transfers: Sit to/from Stand Sit to Stand: Min guard         General transfer comment: Min Guard A for safety   Ambulation/Gait Ambulation/Gait assistance: Herbalist (Feet): 40 Feet Assistive device: Rolling walker (2 wheeled) Gait Pattern/deviations: Step-through pattern;Shuffle;Decreased stride length     General Gait Details: steady assist during turns and aaround corners/obstacles and backing up  Science writer    Modified Rankin (Stroke Patients Only)        Balance Overall balance assessment: Needs assistance Sitting-balance support: No upper extremity supported;Feet supported Sitting balance-Leahy Scale: Fair     Standing balance support: Bilateral upper extremity supported;During functional activity Standing balance-Leahy Scale: Poor Standing balance comment: Reliant on UE support                             Pertinent Vitals/Pain Pain Assessment: No/denies pain Faces Pain Scale: No hurt Pain Intervention(s): Monitored during session    Home Living Family/patient expects to be discharged to:: [place]: Walker - 2 wheels      Prior Function Level of Independence: Needs assistance   Gait / Transfers Assistance Needed: PT reports he uses RW  ADL's / Homemaking Assistance Needed: Unsure. Pt reporting he performs ADLs  Comments: Due to baseline dementia, unsure of reliability for PLOF.      Hand Dominance        Extremity/Trunk Assessment   Upper Extremity Assessment Upper Extremity Assessment: Defer to OT evaluation    Lower Extremity Assessment Lower Extremity Assessment: Generalized weakness    Cervical / Trunk Assessment Cervical / Trunk Assessment: Kyphotic  Communication   Communication: No difficulties  Cognition Arousal/Alertness: Awake/alert Behavior During Therapy: WFL for tasks assessed/performed Overall Cognitive Status: History of cognitive impairments - at baseline  General Comments: History of dementia. Follows cues and motivated to participate in therapy      General Comments General comments (skin integrity, edema, etc.): VSS throughout on RA    Exercises     Assessment/Plan    PT Assessment Patient needs continued PT services  PT Problem List Decreased strength;Decreased mobility;Decreased safety awareness;Decreased coordination;Decreased knowledge of precautions;Decreased activity  tolerance;Decreased cognition;Decreased balance;Decreased knowledge of use of DME       PT Treatment Interventions DME instruction;Gait training;Functional mobility training;Therapeutic exercise;Therapeutic activities;Patient/family education    PT Goals (Current goals can be found in the Care Plan section)  Acute Rehab PT Goals Patient Stated Goal: "Go home" PT Goal Formulation: With patient Time For Goal Achievement: 01/06/19 Potential to Achieve Goals: Good    Frequency Min 2X/week   Barriers to discharge        Co-evaluation PT/OT/SLP Co-Evaluation/Treatment: Yes Reason for Co-Treatment: For patient/therapist safety PT goals addressed during session: Mobility/safety with mobility OT goals addressed during session: ADL's and self-care       AM-PAC PT "6 Clicks" Mobility  Outcome Measure Help needed turning from your back to your side while in a flat bed without using bedrails?: A Little Help needed moving from lying on your back to sitting on the side of a flat bed without using bedrails?: A Little Help needed moving to and from a bed to a chair (including a wheelchair)?: A Little Help needed standing up from a chair using your arms (e.g., wheelchair or bedside chair)?: A Little Help needed to walk in hospital room?: A Lot Help needed climbing 3-5 steps with a railing? : A Lot 6 Click Score: 16    End of Session Equipment Utilized During Treatment: Gait belt Activity Tolerance: Patient tolerated treatment well Patient left: in chair;with call bell/phone within reach;with chair alarm set Nurse Communication: Mobility status PT Visit Diagnosis: Unsteadiness on feet (R26.81)    Time: 0092-3300 PT Time Calculation (min) (ACUTE ONLY): 25 min   Charges:   PT Evaluation $PT Eval Low Complexity: 1 Low          [phone]   Claretha Cooper 12/23/2018, 9:22 AM

## 2018-12-23 NOTE — Evaluation (Addendum)
Occupational Therapy Evaluation Patient Details Name: [name] MRN: [id number] DOB: 05/17/1946 Today's Date: 12/23/2018    History of Present Illness [name] is a 73 y.o. male with medical history significant for  Alzheimer dementia, history of CVA, coronary artery disease, type 2 diabetes mellitus, and hypertension, now presenting from his nursing facility for evaluation of fever, positive for covid-19, gastritis, hypoxia..   Clinical Impression   PTA, pt was living at [facility] and using a RW for functional mobility; unsure of level of assist with ADLS as pt poor historian. Pt currently requiring Min A for ADLs and Min A for functional mobility with RW. Pt presenting with decreased strength, balance, and activity tolerance. Pt with one episode of LOB during mobility requiring Min A for correct balance. Pt would benefit from further acute OT to facilitate safe dc. Pending confirmation of support at [facility], recommend dc with follow up [place] for further OT to optimize safety, independence with ADLs, and return to PLOF. As pt is a fall risk, will require initial 24/7 support.     Follow Up Recommendations  Home health OT;Supervision/Assistance - 24 hour;SNF(Will need confirmation of assistance at home)    Equipment Recommendations  Other (comment)(Need confirmation of home DME)    Recommendations for Other Services       Precautions / Restrictions Precautions Precautions: Fall      Mobility Bed Mobility Overal bed mobility: Needs Assistance Bed Mobility: Supine to Sit     Supine to sit: Min guard;HOB elevated     General bed mobility comments: Min Guard A for safety and HOB elevate to assist with trunk  Transfers Overall transfer level: Needs assistance Equipment used: Rolling walker (2 wheeled) Transfers: Sit to/from Stand Sit to Stand: Min guard         General transfer comment: Min Guard A for safety     Balance Overall balance assessment: Needs  assistance Sitting-balance support: No upper extremity supported;Feet supported Sitting balance-Leahy Scale: Fair     Standing balance support: Bilateral upper extremity supported;During functional activity Standing balance-Leahy Scale: Poor Standing balance comment: Reliant on UE support                           ADL either performed or assessed with clinical judgement   ADL Overall ADL's : Needs assistance/impaired Eating/Feeding: With adaptive utensils;With assist to don/doff brace/orthosis;Set up;Supervision/ safety;Sitting Eating/Feeding Details (indicate cue type and reason): Providing pt with built up handle for self feeding. OPtimziing independence as pt has weak grasp at baseline Grooming: Set up;Supervision/safety;Sitting   Upper Body Bathing: Minimal assistance;Sitting   Lower Body Bathing: Minimal assistance;Sit to/from stand   Upper Body Dressing : Minimal assistance;Sitting   Lower Body Dressing: Minimal assistance;Sit to/from stand   Toilet Transfer: Minimal assistance;RW;Ambulation(simulated to recliner)           Functional mobility during ADLs: Minimal assistance;Rolling walker General ADL Comments: Pt presenting with decreased strength, balance, and activity tolerance. Motivated to participate in therapy.      Vision         Perception     Praxis      Pertinent Vitals/Pain Pain Assessment: Faces Faces Pain Scale: No hurt Pain Intervention(s): Monitored during session     Hand Dominance     Extremity/Trunk Assessment Upper Extremity Assessment Upper Extremity Assessment: Generalized weakness(Bilateral flexion of digits with weak grasp)   Lower Extremity Assessment Lower Extremity Assessment: Defer to PT evaluation  Cervical / Trunk Assessment Cervical / Trunk Assessment: Kyphotic   Communication Communication Communication: No difficulties   Cognition Arousal/Alertness: Awake/alert Behavior During Therapy: WFL for tasks  assessed/performed Overall Cognitive Status: History of cognitive impairments - at baseline                                 General Comments: History of dementia. Follows cues and motivated to participate in therapy   General Comments  VSS throughout on RA    Exercises     Shoulder Instructions      Home Living Family/patient expects to be discharged to:: [place]: Walker - 2 wheels          Prior Functioning/Environment Level of Independence: Needs assistance  Gait / Transfers Assistance Needed: PT reports he uses RW ADL's / Homemaking Assistance Needed: Unsure. Pt reporting he performs ADLs   Comments: Due to baseline dementia, unsure of reliability for PLOF.         OT Problem List: Decreased strength;Decreased range of motion;Decreased activity tolerance;Impaired balance (sitting and/or standing);Decreased knowledge of use of DME or AE;Decreased knowledge of precautions      OT Treatment/Interventions: Self-care/ADL training;Therapeutic exercise;Energy conservation;DME and/or AE instruction;Therapeutic activities;Patient/family education    OT Goals(Current goals can be found in the care plan section) Acute Rehab OT Goals Patient Stated Goal: "Go home" OT Goal Formulation: With patient Time For Goal Achievement: 01/06/19 Potential to Achieve Goals: Good  OT Frequency: Min 3X/week   Barriers to D/C:            Co-evaluation PT/OT/SLP Co-Evaluation/Treatment: Yes Reason for Co-Treatment: For patient/therapist safety PT goals addressed during session: Mobility/safety with mobility OT goals addressed during session: ADL's and self-care      AM-PAC OT "6 Clicks" Daily Activity     Outcome Measure Help from another person eating meals?: A Little Help from another person taking care of personal grooming?: A Little Help from another person toileting, which includes using  toliet, bedpan, or urinal?: A Little Help from another person bathing (including washing, rinsing, drying)?: A Little Help from another person to put on and taking off regular upper body clothing?: A Little Help from another person to put on and taking off regular lower body clothing?: A Little 6 Click Score: 18   End of Session Equipment Utilized During Treatment: Rolling walker;Gait belt Nurse Communication: Mobility status  Activity Tolerance: Patient tolerated treatment well Patient left: in chair;with call bell/phone within reach;with chair alarm set  OT Visit Diagnosis: Unsteadiness on feet (R26.81);Other abnormalities of gait and mobility (R26.89);Muscle weakness (generalized) (M62.81)                Time: 2878-6767 OT Time Calculation (min): 22 min Charges:  OT General Charges $OT Visit: 1 Visit OT Evaluation $OT Eval Moderate Complexity: [place], OTR/L Acute Rehab Pager: [phone] Office: [place] 12/23/2018, 9:19 AM

## 2018-12-23 NOTE — Progress Notes (Signed)
Progress Note    MARQ REBELLO LAG:[id number] DOB: 1946/05/27 DOA: 12/19/2018  PCP: Sande Brothers, MD   Patient coming from: [facility]   Chief Complaint: Fever   HPI: [name] is a 73 y.o. male with medical history significant for  Alzheimer dementia, history of CVA, coronary artery disease, type 2 diabetes mellitus, and hypertension, now presenting from his nursing facility for evaluation of fever.  Patient reported some abdominal "rumbling" and diarrhea, but denied any pain in his abdomen, and denied any other complaints in the ED.  History is limited by his chronic cognitive deficits and he is unable to say when the diarrhea began.  He denies any cough or shortness of breath.  Baby aspirin is on his medicine list, but no anticoagulants or NSAID, and no PPI or H2 blocker.  No endoscopy reports in EMR. Upon arrival to the ED, patient is found to be febrile to 38.6 C, saturating low 90s on room air, slightly tachypneic, slightly bradycardic, and with blood pressure 100/57.  EKG features a sinus rhythm and chest x-ray is notable for subtle opacity at the right mid to lower lung which could reflect atelectasis or developing infiltrate.  CT of the abdomen and pelvis reveals fluid-filled colon and small bowel with air-fluid levels and gaseous gastric distention consistent with gastroenteritis without obstruction, but with rectosigmoid colonic wall thickening which could represent focal proctocolitis.  Chemistry panel is notable for sodium of 133 and creatinine 1.55, up from 1.26 in November.  CBC features a mild leukocytosis and hemoglobin of 8.9, down from 10.5 in October. Lactic acid is reassuringly normal.  Fecal occult blood testing was positive. Blood cultures were collected, 1L of normal saline was administered, and the patient was treated with Rocephin, azithromycin, and IV Protonix in the ED, GI consulted by the ED physician and hospitalists asked to admit.  Assessment/Plan   Acute hypoxic  respiratory failure in the setting of COVID-19 infection pneumonia, RUL, POA, resolving -Patient admitted initially with only fever and diarrhea, found to be positive to COVID-19 - not hypoxic at intake -Patient hypoxia resolving; RUL infiltrate on CXR -previously on 1 to 2 L nasal cannula even at rest -Continue steroids/remdesivir per protocol -Patient continues to deny overt respiratory symptoms however given his baseline is alert to person only history is quite limited and somewhat unreliable -Methylprednisolone 7/5-->ongoing -Remdesivir 7/5-->ongoing -last dose 7/9 -Covid labs somewhat stable: Recent Labs    12/21/18 0446 12/21/18 0925 12/22/18 0427 12/23/18 0255  DDIMER  --  0.96* 1.03* 1.20*  FERRITIN 230  --  300 290  LDH  --   --  212* 217*  CRP 2.4*  --  4.6* 2.5*   Asymptomatic anemia with questionable occult GI bleeding vs hemorrhoidal bleed, POA, stable  -Presents from SNF with fever and diarrhea, found to have Hgb of 8.9, down from 10.5 in October, and FOBT is positive - ?history of hemorrhoids per previous discussion -Abdominal exam benign, CT findings consistent with diarrheal state and possible proctocolitis  -Previously received protonix IV -Gastroenterology consulted by ED - no intervention indicated per that discussion  CAD, history of, stable - No anginal complaints  - Hold ASA while monitoring ?GIB    Insulin-dependent diabetes type 2, well controlled -A1c 6.3% in November 2019  -Managed with Lantus and metformin at SNF  -Continue sliding scale insulin, hypoglycemic protocol  Hypertension essential -BP was low initially, normalized with IVF, will hold antihypertensives; resume as needed  History of CVA  -No focal  deficits noted  -Hold ASA while monitoring GIB    Dementia, Alzheimer's - Alzheimer dementia, oriented to person and ambulates with walker at baseline  - Continue supportive care    AKI on ? CKD, POA, improving    -Cr 1.32 currently; 1.5 on  admission, baseline around 1.2 previously   Mild asymptomatic hyponatremia, resolved -Na WNL now  PPE: CAPR, gown, gloves. Patient wearing mask.  DVT prophylaxis: SCD's - hold chemical ppx given fobt+ stool Code Status: Full  Family Communication: Sister, Rawley Harju, updated by phone Consults called: Gastroenterology consulted by ED physician - no longer following Disposition: Patient currently remains inpatient, now off oxygen - continues on Remdesivir through 7/9, patient previously resides at facility, will discuss with case management if it is feasible for patient to return to facility or if he will need to be transition to other facility given patient is COVID-19 positive.  Subjective:  Difficult to obtain given patient's baseline mental status but acutely denies any shortness of breath, pain, issues eating or going to the bathroom  Past Medical History:  Diagnosis Date  . Coronary artery disease   . Diabetes mellitus    type II  . Gastritis   . Glaucoma    hx of  . Hypercholesterolemia   . Hypertension   . Insomnia   . Stroke [facility])    hx of multiple    Past Surgical History:  Procedure Laterality Date  . CATARACT EXTRACTION    . CORONARY ARTERY BYPASS GRAFT  2006   LIMA to LAD; SVG gto diagonal; SVG to ramus/L [facility])     reports that he has never smoked. He has never used smokeless tobacco. He reports that he does not drink alcohol or use drugs.  No Known Allergies  Family History  Problem Relation Age of Onset  . Diabetes Father 45  . Other Mother 35       alive     Prior to Admission medications   Medication Sig Start Date End Date Taking? Authorizing Provider  acetaminophen (TYLENOL) 325 MG tablet Take 650 mg by mouth 2 (two) times daily.    [provider]  Acetaminophen 325 MG CAPS Take 650 mg by mouth every 4 (four) hours as needed for mild pain.    [provider]  aspirin 81 MG chewable tablet Chew 81 mg by mouth daily.      [provider]  atorvastatin (LIPITOR) 40 MG tablet Take 40 mg by mouth at bedtime.     [provider]  carvedilol (COREG) 3.125 MG tablet Take 3.125 mg by mouth 2 (two) times daily.  02/27/18   [provider]  doxepin (SINEQUAN) 10 MG capsule Take 10 mg by mouth at bedtime.  02/27/18   [provider]  escitalopram (LEXAPRO) 10 MG tablet Take 10 mg by mouth daily.  02/27/18   [provider]  ferrous gluconate (FERGON) 324 MG tablet Take 324 mg by mouth daily.    [provider]  furosemide (LASIX) 40 MG tablet Take 40 mg by mouth daily.     [provider]  insulin glargine (LANTUS) 100 UNIT/ML injection Inject 5-19 Units into the skin See admin instructions. Inject 14 units every morning and 5 units every night at bedtime.    [provider]  lisinopril (PRINIVIL,ZESTRIL) 5 MG tablet Take 5 mg by mouth daily.  02/27/18   [provider]  metFORMIN (GLUCOPHAGE) 1000 MG tablet Take 1,000 mg by mouth 2 (two) times  daily.  03/04/18   [provider]    Physical Exam: Vitals:   12/23/18 1100 12/23/18 1200 12/23/18 1300 12/23/18 1400  BP:      Pulse: (!) 57 61 62 (!) 56  Resp:      Temp:      TempSrc:      SpO2: 97% 96% 98% 96%  Weight:      Height:        Constitutional: NAD, calm, non toxic, resting comfortably, pleasantly demented Eyes: PER, lids and conjunctivae normal ENMT: Mucous membranes are moist. Posterior pharynx clear of any exudate or lesions.   Neck: normal, supple, no masses, no thyromegaly Respiratory: no wheezing, no crackles. No accessory muscle use.  Cardiovascular: S1 & S2 heard, regular rate and rhythm. No extremity edema.   Abdomen: No distension, no tenderness, soft. Bowel sounds active.  Musculoskeletal: no clubbing / cyanosis. No joint deformity upper and lower extremities.  Skin: no significant rashes, lesions, ulcers. Warm, dry, well-perfused. Neurologic: No gross facial  asymmetry. Sensation intact. Moving all extremities.  Psychiatric: Alert and oriented to person only. Pleasant, cooperative.   Labs on Admission: I have personally reviewed following labs and imaging studies  CBC: Recent Labs  Lab 12/19/18 2231 12/20/18 0951 12/21/18 0446 12/22/18 0427 12/23/18 0255  WBC 10.7* 11.8* 10.5 7.1 13.5*  NEUTROABS 5.9  --  5.6 5.5 11.1*  HGB 8.9* 9.2* 8.2* 9.0* 9.3*  HCT 27.6* 27.5* 25.8* 28.3* 28.0*  MCV 95.8 94.5 96.6 95.6 94.0  PLT 179 150 171 180 621   Basic Metabolic Panel: Recent Labs  Lab 12/19/18 2231 12/21/18 0446 12/22/18 0427 12/23/18 0255  NA 133* 135 134* 136  K 5.1 4.8 5.4* 5.4*  CL 100 103 101 103  CO2 24 25 23 24   GLUCOSE 87 82 265* 213*  BUN 25* 24* 30* 40*  CREATININE 1.55* 1.32* 1.38* 1.33*  CALCIUM 8.8* 8.5* 8.8* 9.0   GFR: Estimated Creatinine Clearance: 53.5 mL/min (A) (by C-G formula based on SCr of 1.33 mg/dL (H)). Liver Function Tests: Recent Labs  Lab 12/19/18 2231 12/21/18 0446 12/22/18 0427 12/23/18 0255  AST 33 33 49* 49*  ALT 27 27 50* 58*  ALKPHOS 53 51 65 65  BILITOT 0.7 0.6 0.4 0.5  PROT 6.7 6.0* 6.8 6.8  ALBUMIN 3.3* 3.2* 3.2* 3.2*   Recent Labs  Lab 12/19/18 2337  LIPASE 32   No results for input(s): AMMONIA in the last 168 hours. Coagulation Profile: Recent Labs  Lab 12/19/18 2231  INR 1.1   Cardiac Enzymes: No results for input(s): CKTOTAL, CKMB, CKMBINDEX, TROPONINI in the last 168 hours. BNP (last 3 results) No results for input(s): PROBNP in the last 8760 hours. HbA1C: No results for input(s): HGBA1C in the last 72 hours. CBG: Recent Labs  Lab 12/22/18 1156 12/22/18 1744 12/22/18 2056 12/23/18 0824 12/23/18 1201  GLUCAP 320* 361* 327* 313* 332*   Lipid Profile: No results for input(s): CHOL, HDL, LDLCALC, TRIG, CHOLHDL, LDLDIRECT in the last 72 hours. Thyroid Function Tests: No results for input(s): TSH, T4TOTAL, FREET4, T3FREE, THYROIDAB in the last 72 hours. Anemia  Panel: Recent Labs    12/22/18 0427 12/23/18 0255  FERRITIN 300 290   Urine analysis:    Component Value Date/Time   COLORURINE YELLOW 12/20/2018 0202   APPEARANCEUR CLEAR 12/20/2018 0202   LABSPEC 1.014 12/20/2018 0202   PHURINE 5.0 12/20/2018 0202   GLUCOSEU NEGATIVE 12/20/2018 0202   GLUCOSEU NEGATIVE 05/05/2018 1438   HGBUR NEGATIVE  12/20/2018 0202   BILIRUBINUR NEGATIVE 12/20/2018 0202   KETONESUR NEGATIVE 12/20/2018 0202   PROTEINUR 100 (A) 12/20/2018 0202   UROBILINOGEN 0.2 05/05/2018 1438   NITRITE NEGATIVE 12/20/2018 0202   LEUKOCYTESUR NEGATIVE 12/20/2018 0202   Recent Results (from the past 240 hour(s))  Culture, blood (Routine x 2)     Status: None (Preliminary result)   Collection Time: 12/19/18 10:31 PM   Specimen: BLOOD  Result Value Ref Range Status   Specimen Description BLOOD LEFT ARM  Final   Special Requests   Final    BOTTLES DRAWN AEROBIC AND ANAEROBIC Blood Culture adequate volume   Culture   Final    NO GROWTH 4 DAYS Performed at [facility], [place] [address], [place], [place] [address]    Report Status PENDING  Incomplete  SARS Coronavirus 2 (CEPHEID - Performed in [place] hospital lab), Hosp Order     Status: Abnormal   Collection Time: 12/19/18 10:40 PM   Specimen: Nasopharyngeal Swab  Result Value Ref Range Status   SARS Coronavirus 2 POSITIVE (A) NEGATIVE Final    Comment: RESULT CALLED TO, READ BACK BY AND VERIFIED WITH: T [facility] RN 12/19/18 2354 JDW (NOTE) If result is NEGATIVE SARS-CoV-2 target nucleic acids are NOT DETECTED. The SARS-CoV-2 RNA is generally detectable in upper and lower  respiratory specimens during the acute phase of infection. The lowest  concentration of SARS-CoV-2 viral copies this assay can detect is 250  copies / mL. A negative result does not preclude SARS-CoV-2 infection  and should not be used as the sole basis for treatment or other  patient management decisions.  A negative result may occur with   improper specimen collection / handling, submission of specimen other  than nasopharyngeal swab, presence of viral mutation(s) within the  areas targeted by this assay, and inadequate number of viral copies  (<250 copies / mL). A negative result must be combined with clinical  observations, patient history, and epidemiological information. If result is POSITIVE SARS-CoV-2 target nucleic acids are DETECTED. The SARS -CoV-2 RNA is generally detectable in upper and lower  respiratory specimens during the acute phase of infection.  Positive  results are indicative of active infection with SARS-CoV-2.  Clinical  correlation with patient history and other diagnostic information is  necessary to determine patient infection status.  Positive results do  not rule out bacterial infection or co-infection with other viruses. If result is PRESUMPTIVE POSTIVE SARS-CoV-2 nucleic acids MAY BE PRESENT.   A presumptive positive result was obtained on the submitted specimen  and confirmed on repeat testing.  While 2019 novel coronavirus  (SARS-CoV-2) nucleic acids may be present in the submitted sample  additional confirmatory testing may be necessary for epidemiological  and / or clinical management purposes  to differentiate between  SARS-CoV-2 and other Sarbecovirus currently known to infect humans.  If clinically indicated additional testing with an alternate test  methodology [phone]) is advise d. The SARS-CoV-2 RNA is generally  detectable in upper and lower respiratory specimens during the acute  phase of infection. The expected result is Negative. Fact Sheet for Patients:  StrictlyIdeas.no Fact Sheet for Healthcare Providers: BankingDealers.co.za This test is not yet approved or cleared by the [place] FDA and has been authorized for detection and/or diagnosis of SARS-CoV-2 by FDA under an Emergency Use Authorization (EUA).  This EUA will  remain in effect (meaning this test can be used) for the duration of the COVID-19 declaration under Section 564(b)(1)  of the Act, 21 U.S.C. section 360bbb-3(b)(1), unless the authorization is terminated or revoked sooner. Performed at [facility], [place] [address], [place], [place] [address]   Culture, blood (Routine x 2)     Status: None (Preliminary result)   Collection Time: 12/20/18  2:00 AM   Specimen: BLOOD  Result Value Ref Range Status   Specimen Description BLOOD RIGHT ARM  Final   Special Requests   Final    BOTTLES DRAWN AEROBIC AND ANAEROBIC Blood Culture adequate volume   Culture   Final    NO GROWTH 3 DAYS Performed at [facility], [place] [address], [place], [place] [address]    Report Status PENDING  Incomplete     Radiological Exams on Admission: No results found.  Little Ishikawa, MD [facility] Pager [phone]  If 7PM-7AM, please contact night-coverage [email or web address] Password [facility]  12/23/2018, 3:48 PM

## 2018-12-23 NOTE — Progress Notes (Addendum)
Dr. Loleta Books present on floor and RN asked MD about giving scheduled Coreg 3.125 mg with heart rate of 57.  MD stated it was okay to go ahead and give it.

## 2018-12-23 NOTE — Progress Notes (Signed)
Spoke with patients sister.  Updated on pt status.  Pt worked with PT/OT this am and got into chair for breakfast.  Now back in bed taking a nap and resting comfortably.  Still off oxygen.

## 2018-12-24 LAB — CBC WITH DIFFERENTIAL/PLATELET
Abs Immature Granulocytes: 0.06 10*3/uL (ref 0.00–0.07)
Basophils Absolute: 0 10*3/uL (ref 0.0–0.1)
Basophils Relative: 0 %
Eosinophils Absolute: 0 10*3/uL (ref 0.0–0.5)
Eosinophils Relative: 0 %
HCT: 28.6 % — ABNORMAL LOW (ref 39.0–52.0)
Hemoglobin: 9.4 g/dL — ABNORMAL LOW (ref 13.0–17.0)
Immature Granulocytes: 1 %
Lymphocytes Relative: 9 %
Lymphs Abs: 1.2 10*3/uL (ref 0.7–4.0)
MCH: 31.2 pg (ref 26.0–34.0)
MCHC: 32.9 g/dL (ref 30.0–36.0)
MCV: 95 fL (ref 80.0–100.0)
Monocytes Absolute: 0.7 10*3/uL (ref 0.1–1.0)
Monocytes Relative: 6 %
Neutro Abs: 10.8 10*3/uL — ABNORMAL HIGH (ref 1.7–7.7)
Neutrophils Relative %: 84 %
Platelets: 213 10*3/uL (ref 150–400)
RBC: 3.01 MIL/uL — ABNORMAL LOW (ref 4.22–5.81)
RDW: 12.5 % (ref 11.5–15.5)
WBC: 12.8 10*3/uL — ABNORMAL HIGH (ref 4.0–10.5)
nRBC: 0 % (ref 0.0–0.2)

## 2018-12-24 LAB — COMPREHENSIVE METABOLIC PANEL
ALT: 117 U/L — ABNORMAL HIGH (ref 0–44)
AST: 94 U/L — ABNORMAL HIGH (ref 15–41)
Albumin: 3 g/dL — ABNORMAL LOW (ref 3.5–5.0)
Alkaline Phosphatase: 62 U/L (ref 38–126)
Anion gap: 8 (ref 5–15)
BUN: 44 mg/dL — ABNORMAL HIGH (ref 8–23)
CO2: 25 mmol/L (ref 22–32)
Calcium: 8.8 mg/dL — ABNORMAL LOW (ref 8.9–10.3)
Chloride: 105 mmol/L (ref 98–111)
Creatinine, Ser: 1.25 mg/dL — ABNORMAL HIGH (ref 0.61–1.24)
GFR calc Af Amer: >60 mL/min (ref >60)
GFR calc non Af Amer: 57 mL/min — ABNORMAL LOW (ref >60)
Glucose, Bld: 254 mg/dL — ABNORMAL HIGH (ref 70–99)
Potassium: 4.7 mmol/L (ref 3.5–5.1)
Sodium: 138 mmol/L (ref 135–145)
Total Bilirubin: 0.5 mg/dL (ref 0.3–1.2)
Total Protein: 6.6 g/dL (ref 6.5–8.1)

## 2018-12-24 LAB — CULTURE, BLOOD (ROUTINE X 2)
Culture: NO GROWTH
Special Requests: ADEQUATE

## 2018-12-24 LAB — FERRITIN: Ferritin: 269 ng/mL (ref 24–336)

## 2018-12-24 LAB — C-REACTIVE PROTEIN: CRP: 1.3 mg/dL — ABNORMAL HIGH (ref <1.0)

## 2018-12-24 LAB — GLUCOSE, CAPILLARY
Glucose-Capillary: 284 mg/dL — ABNORMAL HIGH (ref 70–99)
Glucose-Capillary: 425 mg/dL — ABNORMAL HIGH (ref 70–99)
Glucose-Capillary: 444 mg/dL — ABNORMAL HIGH (ref 70–99)
Glucose-Capillary: 230 mg/dL — ABNORMAL HIGH (ref 70–99)

## 2018-12-24 LAB — D-DIMER, QUANTITATIVE: D-Dimer, Quant: 1.43 ug/mL-FEU — ABNORMAL HIGH (ref 0.00–0.50)

## 2018-12-24 LAB — LACTATE DEHYDROGENASE: LDH: 234 U/L — ABNORMAL HIGH (ref 98–192)

## 2018-12-24 MED ORDER — PANTOPRAZOLE SODIUM 40 MG PO TBEC
40.0000 mg | DELAYED_RELEASE_TABLET | Freq: Every day | ORAL | Status: DC
  Administered 2018-12-25 – 2018-12-26 (×2): 40 mg via ORAL
  Filled 2018-12-24 (×2): qty 1

## 2018-12-24 MED ORDER — AMLODIPINE BESYLATE 5 MG PO TABS
10.0000 mg | ORAL_TABLET | Freq: Every day | ORAL | Status: DC
  Administered 2018-12-24: 10 mg via ORAL
  Filled 2018-12-24 (×2): qty 2

## 2018-12-24 MED ORDER — METHYLPREDNISOLONE SODIUM SUCC 40 MG IJ SOLR
40.0000 mg | Freq: Every day | INTRAMUSCULAR | Status: DC
  Administered 2018-12-25: 40 mg via INTRAVENOUS
  Filled 2018-12-24: qty 1

## 2018-12-24 MED ORDER — MIRTAZAPINE 15 MG PO TABS
7.5000 mg | ORAL_TABLET | Freq: Every day | ORAL | Status: DC
  Administered 2018-12-24: 7.5 mg via ORAL

## 2018-12-24 MED ORDER — ATORVASTATIN CALCIUM 40 MG PO TABS
40.0000 mg | ORAL_TABLET | Freq: Every day | ORAL | Status: DC
  Administered 2018-12-24 – 2018-12-25 (×2): 40 mg via ORAL
  Filled 2018-12-24 (×2): qty 1

## 2018-12-24 MED ORDER — INSULIN ASPART 100 UNIT/ML ~~LOC~~ SOLN
22.0000 [IU] | Freq: Once | SUBCUTANEOUS | Status: AC
  Administered 2018-12-24: 22 [IU] via SUBCUTANEOUS

## 2018-12-24 MED ORDER — INSULIN GLARGINE 100 UNIT/ML ~~LOC~~ SOLN
25.0000 [IU] | SUBCUTANEOUS | Status: DC
  Administered 2018-12-24: 25 [IU] via SUBCUTANEOUS
  Filled 2018-12-24: qty 0.25

## 2018-12-24 MED ORDER — ASPIRIN 81 MG PO CHEW
81.0000 mg | CHEWABLE_TABLET | Freq: Every day | ORAL | Status: DC
  Administered 2018-12-24 – 2018-12-26 (×3): 81 mg via ORAL
  Filled 2018-12-24 (×3): qty 1

## 2018-12-24 MED ORDER — HEPARIN SODIUM (PORCINE) 5000 UNIT/ML IJ SOLN
5000.0000 [IU] | Freq: Three times a day (TID) | INTRAMUSCULAR | Status: DC
  Administered 2018-12-24 – 2018-12-26 (×6): 5000 [IU] via SUBCUTANEOUS
  Filled 2018-12-24 (×5): qty 1

## 2018-12-24 NOTE — Progress Notes (Signed)
PROGRESS NOTE                                                                                                                                                                                                             Patient Demographics:    [name], is a 73 y.o. male, DOB - [birth year], RJJ:[id number]  Outpatient Primary MD for the patient is Sande Brothers, MD    LOS - 4  Admit date - 12/19/2018    Chief Complaint  Patient presents with  . Fever       Brief Narrative  [name] is a 73 y.o. male with medical history significant for  Alzheimer dementia, history of CVA, coronary artery disease, type 2 diabetes mellitus, and hypertension, now presenting from his nursing facility for evaluation of fever, he was diagnosed with Covid 19 Gastroenteritis + PNA and admitted.    Subjective:    [name] today has, No headache, No chest pain, No abdominal pain - No Nausea, No new weakness tingling or numbness,No Cough - SOB.     Assessment  & Plan :     1. Gastroenteritis + Acute Covid 19 Viral Pneumonitis during the ongoing 2020 Covid 19 Pandemic - resolved Resp symptoms, GI symptoms almost completely resolved, Inflamm markers better, taper steroids and finish Remdisvir.   COVID-19 Labs  Recent Labs    12/22/18 0427 12/23/18 0255 12/24/18 0410  DDIMER 1.03* 1.20* 1.43*  FERRITIN 300 290 269  LDH 212* 217* 234*  CRP 4.6* 2.5* 1.3*    Lab Results  Component Value Date   SARSCOV2NAA POSITIVE (A) 12/19/2018     Hepatic Function Latest Ref Rng & Units 12/24/2018 12/23/2018 12/22/2018  Total Protein 6.5 - 8.1 g/dL 6.6 6.8 6.8  Albumin 3.5 - 5.0 g/dL 3.0(L) 3.2(L) 3.2(L)  AST 15 - 41 U/L 94(H) 49(H) 49(H)  ALT 0 - 44 U/L 117(H) 58(H) 50(H)  Alk Phosphatase 38 - 126 U/L 62 65 65  Total Bilirubin 0.3 - 1.2 mg/dL 0.5 0.5 0.4     No results found for: BNP    2.  Transaminitis - due to Covid 19 infection along  with Remdisvir use, trend, no GI discomfort.   3. AOCD stable and H&H uptrending, no GIB here. PCP to monitor.  4. CAD and underlying history of CVA - stable,  resume ASA, Lipitor and Coreg for secondary prevention.  5. HTN  - BP high, add Norvasc to Coreg. Stop IVF.  6. Alz Dementia -at risk for delirium, home dementia medications continued, minimize narcotics benzodiazepines and monitor.  7. Deconditioning.  PT.  Placement to SNF.  8. DM2 - Lantus + ISS, taper steroids.  CBG (last 3)  Recent Labs    12/23/18 1530 12/23/18 2104 12/24/18 0815  GLUCAP 266* 203* 284*      Condition - Extremely Guarded  Family Communication  :  None  Code Status :  Full  Diet :   Diet Order            Diet Carb Modified Fluid consistency: Thin; Room service appropriate? Yes  Diet effective now               Disposition Plan  :  SNF  Consults  :  None  Procedures  :    CT - 1. Fluid-filled colon and small bowel with air-fluid levels, as well as gaseous gastric distension, consistent with gastroenteritis/diarrheal process. No obstruction. 2. Rectosigmoid colonic wall thickening with mild pericolonic edema, likely focal proctocolitis. 3. Cholelithiasis without gallbladder inflammation. 4. Advanced aortic and branch atherosclerosis. Aortic Atherosclerosis (ICD10-I70.0). 5. Trace right pleural effusion.   PUD Prophylaxis :  PPI  DVT Prophylaxis  :  Heparin added  Lab Results  Component Value Date   PLT 213 12/24/2018    Inpatient Medications  Scheduled Meds: . carvedilol  3.125 mg Oral BID  . divalproex  125 mg Oral BID  . doxepin  10 mg Oral QHS  . escitalopram  10 mg Oral Daily  . insulin aspart  0-15 Units Subcutaneous TID WC  . insulin aspart  0-5 Units Subcutaneous QHS  . insulin glargine  10 Units Subcutaneous q morning - 10a  . methylPREDNISolone (SOLU-MEDROL) injection  40 mg Intravenous Q12H  . pantoprazole (PROTONIX) IV  40 mg Intravenous Q12H   Continuous  Infusions: . dextrose 5 % and 0.9% NaCl 75 mL/hr at 12/20/18 1842  . remdesivir 100 mg in NS 250 mL Stopped (12/23/18 2309)   PRN Meds:.acetaminophen **OR** [DISCONTINUED] acetaminophen, ondansetron **OR** ondansetron (ZOFRAN) IV  Antibiotics  :    Anti-infectives (From admission, onward)   Start     Dose/Rate Route Frequency Ordered Stop   12/22/18 1500  remdesivir 100 mg in sodium chloride 0.9 % 250 mL IVPB     100 mg 500 mL/hr over 30 Minutes Intravenous Every 24 hours 12/21/18 1426 12/26/18 1459   12/21/18 1500  remdesivir 200 mg in sodium chloride 0.9 % 250 mL IVPB     200 mg 500 mL/hr over 30 Minutes Intravenous Once 12/21/18 1426 12/21/18 1629   12/20/18 1845  azithromycin (ZITHROMAX) 500 mg in sodium chloride 0.9 % 250 mL IVPB  Status:  Discontinued     500 mg 250 mL/hr over 60 Minutes Intravenous  Once 12/20/18 1830 12/20/18 1831   12/20/18 1845  cefTRIAXone (ROCEPHIN) 1 g in sodium chloride 0.9 % 100 mL IVPB  Status:  Discontinued     1 g 200 mL/hr over 30 Minutes Intravenous  Once 12/20/18 1830 12/20/18 1831   12/20/18 0030  cefTRIAXone (ROCEPHIN) 1 g in sodium chloride 0.9 % 100 mL IVPB     1 g 200 mL/hr over 30 Minutes Intravenous  Once 12/20/18 0022 12/20/18 0241   12/20/18 0030  azithromycin (ZITHROMAX) 500 mg in sodium chloride 0.9 % 250 mL IVPB  500 mg 250 mL/hr over 60 Minutes Intravenous  Once 12/20/18 0022 12/20/18 0410       Time Spent in minutes  30   [name] M.D on 12/24/2018 at 11:49 AM  To page go to [email or web address] - password [email or web address]  [facility] -  Office  [phone]    See all Orders from today for further details    Objective:   Vitals:   12/24/18 0800 12/24/18 0814 12/24/18 0900 12/24/18 0911  BP:  (!) 165/71  (!) 165/71  Pulse: (!) 52 (!) 59 64 63  Resp:  18    Temp:  (!) 97.5 F (36.4 C)    TempSrc:  Oral    SpO2: 97% 98% 99%   Weight:      Height:        Wt Readings from Last 3 Encounters:  12/20/18 75.3 kg   06/05/18 76.1 kg  05/05/18 73.4 kg     Intake/Output Summary (Last 24 hours) at 12/24/2018 1149 Last data filed at 12/24/2018 0900 Gross per 24 hour  Intake 360 ml  Output 450 ml  Net -90 ml     Physical Exam  Awake Alert,  No new F.N deficits, Normal affect [place].AT,PERRAL Supple Neck,No JVD, No cervical lymphadenopathy appriciated.  Symmetrical Chest wall movement, Good air movement bilaterally, CTAB RRR,No Gallops,Rubs or new Murmurs, No Parasternal Heave +ve B.Sounds, Abd Soft, No tenderness, No organomegaly appriciated, No rebound - guarding or rigidity. No Cyanosis, Clubbing or edema, No new Rash or bruise       Data Review:    CBC Recent Labs  Lab 12/19/18 2231 12/20/18 0951 12/21/18 0446 12/22/18 0427 12/23/18 0255 12/24/18 0410  WBC 10.7* 11.8* 10.5 7.1 13.5* 12.8*  HGB 8.9* 9.2* 8.2* 9.0* 9.3* 9.4*  HCT 27.6* 27.5* 25.8* 28.3* 28.0* 28.6*  PLT 179 150 171 180 187 213  MCV 95.8 94.5 96.6 95.6 94.0 95.0  MCH 30.9 31.6 30.7 30.4 31.2 31.2  MCHC 32.2 33.5 31.8 31.8 33.2 32.9  RDW 12.5 12.7 12.7 12.5 12.4 12.5  LYMPHSABS 2.8  --  3.2 1.2 1.4 1.2  MONOABS 1.8*  --  1.6* 0.4 0.9 0.7  EOSABS 0.0  --  0.1 0.0 0.0 0.0  BASOSABS 0.1  --  0.0 0.0 0.0 0.0    Chemistries  Recent Labs  Lab 12/19/18 2231 12/21/18 0446 12/22/18 0427 12/23/18 0255 12/24/18 0410  NA 133* 135 134* 136 138  K 5.1 4.8 5.4* 5.4* 4.7  CL 100 103 101 103 105  CO2 '24 25 23 24 25  ' GLUCOSE 87 82 265* 213* 254*  BUN 25* 24* 30* 40* 44*  CREATININE 1.55* 1.32* 1.38* 1.33* 1.25*  CALCIUM 8.8* 8.5* 8.8* 9.0 8.8*  AST 33 33 49* 49* 94*  ALT 27 27 50* 58* 117*  ALKPHOS 53 51 65 65 62  BILITOT 0.7 0.6 0.4 0.5 0.5   ------------------------------------------------------------------------------------------------------------------ No results for input(s): CHOL, HDL, LDLCALC, TRIG, CHOLHDL, LDLDIRECT in the last 72 hours.  Lab Results  Component Value Date   HGBA1C 6.7 (H) 12/20/2018    ------------------------------------------------------------------------------------------------------------------ No results for input(s): TSH, T4TOTAL, T3FREE, THYROIDAB in the last 72 hours.  Invalid input(s): FREET3  Cardiac Enzymes No results for input(s): CKMB, TROPONINI, MYOGLOBIN in the last 168 hours.  Invalid input(s): CK ------------------------------------------------------------------------------------------------------------------ No results found for: BNP  Micro Results Recent Results (from the past 240 hour(s))  Culture, blood (Routine x 2)     Status: None   Collection Time: 12/19/18 10:31  PM   Specimen: BLOOD  Result Value Ref Range Status   Specimen Description BLOOD LEFT ARM  Final   Special Requests   Final    BOTTLES DRAWN AEROBIC AND ANAEROBIC Blood Culture adequate volume   Culture   Final    NO GROWTH 5 DAYS Performed at [facility], [address] [place], [place] [address]    Report Status 12/24/2018 FINAL  Final  SARS Coronavirus 2 (CEPHEID - Performed in [place] hospital lab), Hosp Order     Status: Abnormal   Collection Time: 12/19/18 10:40 PM   Specimen: Nasopharyngeal Swab  Result Value Ref Range Status   SARS Coronavirus 2 POSITIVE (A) NEGATIVE Final    Comment: RESULT CALLED TO, READ BACK BY AND VERIFIED WITH: T [facility] RN 12/19/18 2354 JDW (NOTE) If result is NEGATIVE SARS-CoV-2 target nucleic acids are NOT DETECTED. The SARS-CoV-2 RNA is generally detectable in upper and lower  respiratory specimens during the acute phase of infection. The lowest  concentration of SARS-CoV-2 viral copies this assay can detect is 250  copies / mL. A negative result does not preclude SARS-CoV-2 infection  and should not be used as the sole basis for treatment or other  patient management decisions.  A negative result may occur with  improper specimen collection / handling, submission of specimen other  than nasopharyngeal swab, presence of viral  mutation(s) within the  areas targeted by this assay, and inadequate number of viral copies  (<250 copies / mL). A negative result must be combined with clinical  observations, patient history, and epidemiological information. If result is POSITIVE SARS-CoV-2 target nucleic acids are DETECTED. The SARS -CoV-2 RNA is generally detectable in upper and lower  respiratory specimens during the acute phase of infection.  Positive  results are indicative of active infection with SARS-CoV-2.  Clinical  correlation with patient history and other diagnostic information is  necessary to determine patient infection status.  Positive results do  not rule out bacterial infection or co-infection with other viruses. If result is PRESUMPTIVE POSTIVE SARS-CoV-2 nucleic acids MAY BE PRESENT.   A presumptive positive result was obtained on the submitted specimen  and confirmed on repeat testing.  While 2019 novel coronavirus  (SARS-CoV-2) nucleic acids may be present in the submitted sample  additional confirmatory testing may be necessary for epidemiological  and / or clinical management purposes  to differentiate between  SARS-CoV-2 and other Sarbecovirus currently known to infect humans.  If clinically indicated additional testing with an alternate test  methodology [phone]) is advise d. The SARS-CoV-2 RNA is generally  detectable in upper and lower respiratory specimens during the acute  phase of infection. The expected result is Negative. Fact Sheet for Patients:  StrictlyIdeas.no Fact Sheet for Healthcare Providers: BankingDealers.co.za This test is not yet approved or cleared by the [place] FDA and has been authorized for detection and/or diagnosis of SARS-CoV-2 by FDA under an Emergency Use Authorization (EUA).  This EUA will remain in effect (meaning this test can be used) for the duration of the COVID-19 declaration under Section 564(b)(1)  of the Act, 21 U.S.C. section 360bbb-3(b)(1), unless the authorization is terminated or revoked sooner. Performed at [facility], [place] [address] [place], [place] [address]   Culture, blood (Routine x 2)     Status: None (Preliminary result)   Collection Time: 12/20/18  2:00 AM   Specimen: BLOOD  Result Value Ref Range Status   Specimen Description BLOOD RIGHT  ARM  Final   Special Requests   Final    BOTTLES DRAWN AEROBIC AND ANAEROBIC Blood Culture adequate volume   Culture   Final    NO GROWTH 4 DAYS Performed at [facility], [address] [place], [place] [address]    Report Status PENDING  Incomplete    Radiology Reports Ct Abdomen Pelvis Wo Contrast  Result Date: 12/20/2018 CLINICAL DATA:  Acute abdominal pain. COVID-19 positive. EXAM: CT ABDOMEN AND PELVIS WITHOUT CONTRAST TECHNIQUE: Multidetector CT imaging of the abdomen and pelvis was performed following the standard protocol without IV contrast. COMPARISON:  CT 04/28/2007 FINDINGS: Lower chest: Trace right pleural effusion. Right greater than left lower lobe atelectasis. There are coronary artery calcifications. Hepatobiliary: Multiple gallstones within physiologically distended gallbladder. No definite pericholecystic inflammation. No biliary dilatation. Probable hepatic arterial calcifications. No focal lesion on noncontrast exam. Pancreas: No ductal dilatation or inflammation. Spleen: Normal in size without focal abnormality. Adrenals/Urinary Tract: Small left adrenal adenoma. No right adrenal nodule. Symmetric bilateral perinephric edema. No hydronephrosis. No urolithiasis. Urinary bladder is physiologically distended. No bladder wall thickening. Stomach/Bowel: Small hiatal hernia. Gaseous gastric distension without wall thickening. Air-fluid levels within slightly distended distal small bowel, no definite wall thickening or adjacent inflammation. Fluid/liquid stool throughout the colon with few scattered air-fluid  levels. Minor distal colonic diverticulosis without diverticulitis. Formed stool in the rectosigmoid colon with moderate rectosigmoid wall thickening. Mild adjacent perirectal stranding. Normal appendix tentatively visualized. Vascular/Lymphatic: Advanced aortic and branch atherosclerosis. No aneurysm. No bulky abdominopelvic adenopathy. Reproductive: Prominent prostate gland spanning 5.2 cm transverse. Other: No free air. No significant free fluid. Musculoskeletal: Bones are under mineralized. Rule out bilateral inferior pubic rami fractures. Remote right superior pubic ramus fracture. There are no acute or suspicious osseous abnormalities. IMPRESSION: 1. Fluid-filled colon and small bowel with air-fluid levels, as well as gaseous gastric distension, consistent with gastroenteritis/diarrheal process. No obstruction. 2. Rectosigmoid colonic wall thickening with mild pericolonic edema, likely focal proctocolitis. 3. Cholelithiasis without gallbladder inflammation. 4. Advanced aortic and branch atherosclerosis. Aortic Atherosclerosis (ICD10-I70.0). 5. Trace right pleural effusion. Electronically Signed   By: Keith Rake M.D.   On: 12/20/2018 00:39   Dg Chest Port 1 View  Result Date: 12/21/2018 CLINICAL DATA:  Follow-up coronavirus pneumonia EXAM: PORTABLE CHEST 1 VIEW COMPARISON:  12/19/2018 FINDINGS: Patchy areas of bilateral pneumonia persists, most pronounced in the right perihilar lung. Allowing for technical factors, there is slightly worsened consolidation particularly in the right perihilar region. No lobar involvement. No lobar collapse. No effusion. Previous CABG with cardiomegaly IMPRESSION: Persistent patchy bilateral infiltrates, most pronounced in the right perihilar region, where the pulmonary density is somewhat worsened. Electronically Signed   By: Nelson Chimes M.D.   On: 12/21/2018 08:08   Dg Chest Portable 1 View  Result Date: 12/19/2018 CLINICAL DATA:  Suspected sepsis. EXAM: PORTABLE  CHEST 1 VIEW COMPARISON:  March 31, 2018 FINDINGS: Probable skin fold over the right lung base. Stable cardiomegaly. The hila and mediastinum are unremarkable. No pneumothorax. The left lung is clear. Subtle opacity in the left mid lower lung is identified. No other acute abnormalities. IMPRESSION: Subtle opacity in the right mid lower lung may represent atelectasis or developing infiltrate. No other acute abnormalities. Electronically Signed   By: Dorise Bullion III M.D   On: 12/19/2018 22:45

## 2018-12-24 NOTE — Care Management Important Message (Signed)
Important Message  Patient Details  Name: [name] MRN: [id number] Date of Birth: 08-08-1945   Medicare Important Message Given:  Yes - Important Message mailed due to current National Emergency  Verbal consent obtained due to current National Emergency  Relationship to patient: Family Member Contact Name: Daris Harkins Call Date: 12/24/18  Time: 1317 Phone: [phone] Outcome: Spoke with contact Important Message mailed to: Patient address on file      Tommy Medal 12/24/2018, 1:18 PM

## 2018-12-24 NOTE — Progress Notes (Addendum)
Occupational Therapy Treatment Patient Details Name: [name] MRN: [id number] DOB: 1945-06-21 Today's Date: 12/24/2018    History of present illness AZAD CALAME is a 73 y.o. male with medical history significant for  Alzheimer dementia, history of CVA, coronary artery disease, type 2 diabetes mellitus, and hypertension, now presenting from his nursing facility for evaluation of fever, positive for covid-19, gastritis, hypoxia..   OT comments  Pt progressing towards established OT goals. Continues to present with decreased balance ans safety. Pt performing functional mobility to sink for hand hygiene and then recliner with Min A. Pt performing hand hygiene with Min A for balance. Continue to recommend dc to [facility] and will continue to follow acutely as admitted.    Follow Up Recommendations  Home health OT;Supervision/Assistance - 24 hour;SNF(Will need confirmation of assistance at home)    Equipment Recommendations  Other (comment)(Need confirmation of home DME)    Recommendations for Other Services      Precautions / Restrictions Precautions Precautions: Fall Restrictions Weight Bearing Restrictions: No       Mobility Bed Mobility Overal bed mobility: Needs Assistance Bed Mobility: Supine to Sit     Supine to sit: HOB elevated;Min assist     General bed mobility comments: Min A for elevating trunk  Transfers Overall transfer level: Needs assistance Equipment used: Rolling walker (2 wheeled) Transfers: Sit to/from Stand Sit to Stand: Min guard         General transfer comment: Min Guard A for safety     Balance Overall balance assessment: Needs assistance Sitting-balance support: No upper extremity supported;Feet supported Sitting balance-Leahy Scale: Fair     Standing balance support: Bilateral upper extremity supported;During functional activity Standing balance-Leahy Scale: Poor Standing balance comment: Reliant on UE support                            ADL either performed or assessed with clinical judgement   ADL Overall ADL's : Needs assistance/impaired Eating/Feeding: With adaptive utensils;With assist to don/doff brace/orthosis;Set up;Supervision/ safety;Sitting Eating/Feeding Details (indicate cue type and reason): Pt continues to demonstrate understanding for use of built up handle to eat breakfast Grooming: Minimal assistance;Wash/dry hands;Standing Grooming Details (indicate cue type and reason): Pt performing hand hygiene at sink with Min A for standing balance                 Toilet Transfer: Minimal assistance;RW;Ambulation(Simulated to recliner)           Functional mobility during ADLs: Minimal assistance;Rolling walker General ADL Comments: Pt presenting with decreased strength, balance, and activity tolerance. Motivated to participate in therapy.      Vision       Perception     Praxis      Cognition Arousal/Alertness: Awake/alert Behavior During Therapy: WFL for tasks assessed/performed Overall Cognitive Status: History of cognitive impairments - at baseline                                 General Comments: History of dementia. Follows cues and motivated to participate in therapy        Exercises     Shoulder Instructions       General Comments VSS on RA    Pertinent Vitals/ Pain       Pain Assessment: Faces Faces Pain Scale: No hurt Pain Intervention(s): Monitored during session  Home Living  Prior Functioning/Environment              Frequency  Min 3X/week        Progress Toward Goals  OT Goals(current goals can now be found in the care plan section)  Progress towards OT goals: Progressing toward goals  Acute Rehab OT Goals Patient Stated Goal: "Go home" OT Goal Formulation: With patient Time For Goal Achievement: 01/06/19 Potential to Achieve Goals: Good ADL Goals Pt  Will Perform Grooming: with set-up;with supervision;standing Pt Will Perform Lower Body Dressing: with set-up;with supervision;sit to/from stand Pt Will Transfer to Toilet: with set-up;with supervision;ambulating;bedside commode Pt Will Perform Toileting - Clothing Manipulation and hygiene: with set-up;with supervision;sit to/from stand;sitting/lateral leans  Plan Discharge plan remains appropriate    Co-evaluation                 AM-PAC OT "6 Clicks" Daily Activity     Outcome Measure   Help from another person eating meals?: A Little Help from another person taking care of personal grooming?: A Little Help from another person toileting, which includes using toliet, bedpan, or urinal?: A Little Help from another person bathing (including washing, rinsing, drying)?: A Little Help from another person to put on and taking off regular upper body clothing?: A Little Help from another person to put on and taking off regular lower body clothing?: A Little 6 Click Score: 18    End of Session Equipment Utilized During Treatment: Rolling walker;Gait belt  OT Visit Diagnosis: Unsteadiness on feet (R26.81);Other abnormalities of gait and mobility (R26.89);Muscle weakness (generalized) (M62.81)   Activity Tolerance Patient tolerated treatment well   Patient Left in chair;with call bell/phone within reach;with chair alarm set   Nurse Communication Mobility status        Time: 1779-3903 OT Time Calculation (min): 16 min  Charges: OT General Charges $OT Visit: 1 Visit OT Treatments $Self Care/Home Management : 8-22 mins  [place], OTR/L Acute Rehab Pager: [phone] Office: [place] 12/24/2018, 1:13 PM

## 2018-12-24 NOTE — Progress Notes (Signed)
Inpatient Diabetes Program Recommendations  AACE/ADA: New Consensus Statement on Inpatient Glycemic Control (2015)  Target Ranges:  Prepandial:   less than 140 mg/dL      Peak postprandial:   less than 180 mg/dL (1-2 hours)      Critically ill patients:  140 - 180 mg/dL   Lab Results  Component Value Date   GLUCAP 284 (H) 12/24/2018   HGBA1C 6.7 (H) 12/20/2018    Review of Glycemic Control Results for [name], [name] (MRN [id number]) as of 12/24/2018 11:49  Ref. Range 12/23/2018 08:24 12/23/2018 12:01 12/23/2018 15:30 12/23/2018 21:04 12/24/2018 08:15  Glucose-Capillary Latest Ref Range: 70 - 99 mg/dL 313 (H) 332 (H) 266 (H) 203 (H) 284 (H)   Diabetes history:DM2 Outpatient Diabetes medications:Lantus 11 units = Metformin 1 gm bid Current orders for Inpatient glycemic control:Lantus 10 units + Novolog moderate correction tid + hs 0-5 units  Inpatient Diabetes Program Recommendations:   Please consider: -Increase Lantus to 15 units daily -Add Novolog 3 units tid meal coverage if eats   Thank you, Bethena Roys E. Ahliyah Nienow, RN, MSN, CDE  Diabetes Coordinator Inpatient Glycemic Control Team Team Pager [phone] (8am-5pm) 12/24/2018 11:52 AM

## 2018-12-25 LAB — COMPREHENSIVE METABOLIC PANEL
ALT: 122 U/L — ABNORMAL HIGH (ref 0–44)
AST: 64 U/L — ABNORMAL HIGH (ref 15–41)
Albumin: 2.8 g/dL — ABNORMAL LOW (ref 3.5–5.0)
Alkaline Phosphatase: 65 U/L (ref 38–126)
Anion gap: 11 (ref 5–15)
BUN: 58 mg/dL — ABNORMAL HIGH (ref 8–23)
CO2: 25 mmol/L (ref 22–32)
Calcium: 8.8 mg/dL — ABNORMAL LOW (ref 8.9–10.3)
Chloride: 102 mmol/L (ref 98–111)
Creatinine, Ser: 1.42 mg/dL — ABNORMAL HIGH (ref 0.61–1.24)
GFR calc Af Amer: 57 mL/min — ABNORMAL LOW (ref >60)
GFR calc non Af Amer: 49 mL/min — ABNORMAL LOW (ref >60)
Glucose, Bld: 232 mg/dL — ABNORMAL HIGH (ref 70–99)
Potassium: 4.4 mmol/L (ref 3.5–5.1)
Sodium: 138 mmol/L (ref 135–145)
Total Bilirubin: 0.2 mg/dL — ABNORMAL LOW (ref 0.3–1.2)
Total Protein: 6 g/dL — ABNORMAL LOW (ref 6.5–8.1)

## 2018-12-25 LAB — CBC WITH DIFFERENTIAL/PLATELET
Abs Immature Granulocytes: 0.08 10*3/uL — ABNORMAL HIGH (ref 0.00–0.07)
Basophils Absolute: 0 10*3/uL (ref 0.0–0.1)
Basophils Relative: 0 %
Eosinophils Absolute: 0 10*3/uL (ref 0.0–0.5)
Eosinophils Relative: 0 %
HCT: 28.3 % — ABNORMAL LOW (ref 39.0–52.0)
Hemoglobin: 9.1 g/dL — ABNORMAL LOW (ref 13.0–17.0)
Immature Granulocytes: 1 %
Lymphocytes Relative: 19 %
Lymphs Abs: 2.6 10*3/uL (ref 0.7–4.0)
MCH: 31.1 pg (ref 26.0–34.0)
MCHC: 32.2 g/dL (ref 30.0–36.0)
MCV: 96.6 fL (ref 80.0–100.0)
Monocytes Absolute: 1.5 10*3/uL — ABNORMAL HIGH (ref 0.1–1.0)
Monocytes Relative: 11 %
Neutro Abs: 9.6 10*3/uL — ABNORMAL HIGH (ref 1.7–7.7)
Neutrophils Relative %: 69 %
Platelets: 248 10*3/uL (ref 150–400)
RBC: 2.93 MIL/uL — ABNORMAL LOW (ref 4.22–5.81)
RDW: 12.5 % (ref 11.5–15.5)
WBC: 13.7 10*3/uL — ABNORMAL HIGH (ref 4.0–10.5)
nRBC: 0 % (ref 0.0–0.2)

## 2018-12-25 LAB — D-DIMER, QUANTITATIVE: D-Dimer, Quant: 1.7 ug/mL-FEU — ABNORMAL HIGH (ref 0.00–0.50)

## 2018-12-25 LAB — CULTURE, BLOOD (ROUTINE X 2)
Culture: NO GROWTH
Special Requests: ADEQUATE

## 2018-12-25 LAB — C-REACTIVE PROTEIN: CRP: 0.9 mg/dL (ref <1.0)

## 2018-12-25 LAB — GLUCOSE, CAPILLARY
Glucose-Capillary: 166 mg/dL — ABNORMAL HIGH (ref 70–99)
Glucose-Capillary: 272 mg/dL — ABNORMAL HIGH (ref 70–99)
Glucose-Capillary: 290 mg/dL — ABNORMAL HIGH (ref 70–99)
Glucose-Capillary: 291 mg/dL — ABNORMAL HIGH (ref 70–99)

## 2018-12-25 LAB — LACTATE DEHYDROGENASE: LDH: 192 U/L (ref 98–192)

## 2018-12-25 LAB — FERRITIN: Ferritin: 251 ng/mL (ref 24–336)

## 2018-12-25 MED ORDER — INSULIN GLARGINE 100 UNIT/ML ~~LOC~~ SOLN
15.0000 [IU] | SUBCUTANEOUS | Status: DC
  Administered 2018-12-25: 15 [IU] via SUBCUTANEOUS
  Filled 2018-12-25 (×2): qty 0.15

## 2018-12-25 MED ORDER — LACTATED RINGERS IV SOLN
INTRAVENOUS | Status: AC
  Administered 2018-12-25: 11:00:00 via INTRAVENOUS

## 2018-12-25 NOTE — Progress Notes (Signed)
PROGRESS NOTE                                                                                                                                                                                                             Patient Demographics:    [name], is a 73 y.o. male, DOB - 06-12-1946, ZOX:[id number]  Outpatient Primary MD for the patient is Sande Brothers, MD    LOS - 5  Admit date - 12/19/2018    Chief Complaint  Patient presents with  . Fever       Brief Narrative  [name] is a 73 y.o. male with medical history significant for  Alzheimer dementia, history of CVA, coronary artery disease, type 2 diabetes mellitus, and hypertension, now presenting from his nursing facility for evaluation of fever, he was diagnosed with Covid 19 Gastroenteritis + PNA and admitted.    Subjective:   Patient in bed, appears comfortable, denies any headache, no fever, no chest pain or pressure, no shortness of breath , no abdominal pain. No focal weakness.     Assessment  & Plan :     1. Gastroenteritis + Acute Covid 19 Viral Pneumonitis during the ongoing 2020 Covid 19 Pandemic - resolved Resp symptoms, GI symptoms almost completely resolved, Inflamm markers better, taper steroids and finish Remdisvir.   COVID-19 Labs  Recent Labs    12/23/18 0255 12/24/18 0410 12/25/18 0300 12/25/18 0500  DDIMER 1.20* 1.43*  --  1.70*  FERRITIN 290 269 251  --   LDH 217* 234*  --  192  CRP 2.5* 1.3* 0.9  --     Lab Results  Component Value Date   SARSCOV2NAA POSITIVE (A) 12/19/2018     Hepatic Function Latest Ref Rng & Units 12/25/2018 12/24/2018 12/23/2018  Total Protein 6.5 - 8.1 g/dL 6.0(L) 6.6 6.8  Albumin 3.5 - 5.0 g/dL 2.8(L) 3.0(L) 3.2(L)  AST 15 - 41 U/L 64(H) 94(H) 49(H)  ALT 0 - 44 U/L 122(H) 117(H) 58(H)  Alk Phosphatase 38 - 126 U/L 65 62 65  Total Bilirubin 0.3 - 1.2 mg/dL 0.2(L) 0.5 0.5     No results found for: BNP     2.  Transaminitis - due to Covid 19 infection along with Remdisvir use, trend, no GI discomfort.   3. AOCD stable and H&H uptrending, no  GIB here. PCP to monitor.  4. CAD and underlying history of CVA - stable, resume ASA, Lipitor and Coreg for secondary prevention.  5. HTN  -BP slightly soft hold Norvasc continue Coreg and monitor, gentle hydration on 12/25/2018 with IV fluids.  6. Alz Dementia -at risk for delirium, home dementia medications continued, minimize narcotics benzodiazepines and monitor.  7. Deconditioning.  PT.  Placement to SNF.  8.  AKI.  Hydrate and monitor no nephrotoxins on board.    9. DM2 - steroids being tapered, will reduce Lantus and monitor.  CBG (last 3)  Recent Labs    12/24/18 1708 12/24/18 2051 12/25/18 0826  GLUCAP 444* 230* 166*      Condition - Extremely Guarded  Family Communication  :  None  Code Status :  Full  Diet :   Diet Order            Diet Carb Modified Fluid consistency: Thin; Room service appropriate? Yes  Diet effective now               Disposition Plan  :  SNF  Consults  :  None  Procedures  :    CT - 1. Fluid-filled colon and small bowel with air-fluid levels, as well as gaseous gastric distension, consistent with gastroenteritis/diarrheal process. No obstruction. 2. Rectosigmoid colonic wall thickening with mild pericolonic edema, likely focal proctocolitis. 3. Cholelithiasis without gallbladder inflammation. 4. Advanced aortic and branch atherosclerosis. Aortic Atherosclerosis (ICD10-I70.0). 5. Trace right pleural effusion.   PUD Prophylaxis :  PPI  DVT Prophylaxis  :  Heparin added  Lab Results  Component Value Date   PLT 248 12/25/2018    Inpatient Medications  Scheduled Meds: . amLODipine  10 mg Oral Daily  . aspirin  81 mg Oral Daily  . atorvastatin  40 mg Oral QHS  . carvedilol  3.125 mg Oral BID  . divalproex  125 mg Oral BID  . doxepin  10 mg Oral QHS  . escitalopram  10 mg Oral Daily   . heparin injection (subcutaneous)  5,000 Units Subcutaneous Q8H  . insulin aspart  0-15 Units Subcutaneous TID WC  . insulin aspart  0-5 Units Subcutaneous QHS  . insulin glargine  25 Units Subcutaneous Q24H  . methylPREDNISolone (SOLU-MEDROL) injection  40 mg Intravenous Daily  . mirtazapine  7.5 mg Oral QHS  . pantoprazole  40 mg Oral Daily   Continuous Infusions: . remdesivir 100 mg in NS 250 mL 100 mg (12/24/18 1500)   PRN Meds:.acetaminophen **OR** [DISCONTINUED] acetaminophen, [DISCONTINUED] ondansetron **OR** ondansetron (ZOFRAN) IV  Antibiotics  :    Anti-infectives (From admission, onward)   Start     Dose/Rate Route Frequency Ordered Stop   12/22/18 1500  remdesivir 100 mg in sodium chloride 0.9 % 250 mL IVPB     100 mg 500 mL/hr over 30 Minutes Intravenous Every 24 hours 12/21/18 1426 12/26/18 1459   12/21/18 1500  remdesivir 200 mg in sodium chloride 0.9 % 250 mL IVPB     200 mg 500 mL/hr over 30 Minutes Intravenous Once 12/21/18 1426 12/21/18 1629   12/20/18 1845  azithromycin (ZITHROMAX) 500 mg in sodium chloride 0.9 % 250 mL IVPB  Status:  Discontinued     500 mg 250 mL/hr over 60 Minutes Intravenous  Once 12/20/18 1830 12/20/18 1831   12/20/18 1845  cefTRIAXone (ROCEPHIN) 1 g in sodium chloride 0.9 % 100 mL IVPB  Status:  Discontinued     1 g 200 mL/hr  over 30 Minutes Intravenous  Once 12/20/18 1830 12/20/18 1831   12/20/18 0030  cefTRIAXone (ROCEPHIN) 1 g in sodium chloride 0.9 % 100 mL IVPB     1 g 200 mL/hr over 30 Minutes Intravenous  Once 12/20/18 0022 12/20/18 0241   12/20/18 0030  azithromycin (ZITHROMAX) 500 mg in sodium chloride 0.9 % 250 mL IVPB     500 mg 250 mL/hr over 60 Minutes Intravenous  Once 12/20/18 0022 12/20/18 0410       Time Spent in minutes  30   [name] M.D on 12/25/2018 at 10:02 AM  To page go to [email or web address] - password [facility]  [facility] -  Office  [phone]    See all Orders from today for further details     Objective:   Vitals:   12/24/18 2000 12/25/18 0500 12/25/18 0517 12/25/18 0830  BP: 111/62  133/60 (!) 105/50  Pulse: (!) 55 (!) 53    Resp:      Temp: 98 F (36.7 C)  98.2 F (36.8 C) 97.7 F (36.5 C)  TempSrc: Oral  Oral Oral  SpO2: 96% 97%    Weight:      Height:        Wt Readings from Last 3 Encounters:  12/20/18 75.3 kg  06/05/18 76.1 kg  05/05/18 73.4 kg     Intake/Output Summary (Last 24 hours) at 12/25/2018 1002 Last data filed at 12/25/2018 0938 Gross per 24 hour  Intake 250 ml  Output 1600 ml  Net -1350 ml     Physical Exam  Awake Alert, Oriented X 3, No new F.N deficits, Normal affect [place].AT,PERRAL Supple Neck,No JVD, No cervical lymphadenopathy appriciated.  Symmetrical Chest wall movement, Good air movement bilaterally, CTAB RRR,No Gallops, Rubs or new Murmurs, No Parasternal Heave +ve B.Sounds, Abd Soft, No tenderness, No organomegaly appriciated, No rebound - guarding or rigidity. No Cyanosis, Clubbing or edema, No new Rash or bruise    Data Review:    CBC Recent Labs  Lab 12/21/18 0446 12/22/18 0427 12/23/18 0255 12/24/18 0410 12/25/18 0500  WBC 10.5 7.1 13.5* 12.8* 13.7*  HGB 8.2* 9.0* 9.3* 9.4* 9.1*  HCT 25.8* 28.3* 28.0* 28.6* 28.3*  PLT 171 180 187 213 248  MCV 96.6 95.6 94.0 95.0 96.6  MCH 30.7 30.4 31.2 31.2 31.1  MCHC 31.8 31.8 33.2 32.9 32.2  RDW 12.7 12.5 12.4 12.5 12.5  LYMPHSABS 3.2 1.2 1.4 1.2 2.6  MONOABS 1.6* 0.4 0.9 0.7 1.5*  EOSABS 0.1 0.0 0.0 0.0 0.0  BASOSABS 0.0 0.0 0.0 0.0 0.0    Chemistries  Recent Labs  Lab 12/21/18 0446 12/22/18 0427 12/23/18 0255 12/24/18 0410 12/25/18 0500  NA 135 134* 136 138 138  K 4.8 5.4* 5.4* 4.7 4.4  CL 103 101 103 105 102  CO2 '25 23 24 25 25  ' GLUCOSE 82 265* 213* 254* 232*  BUN 24* 30* 40* 44* 58*  CREATININE 1.32* 1.38* 1.33* 1.25* 1.42*  CALCIUM 8.5* 8.8* 9.0 8.8* 8.8*  AST 33 49* 49* 94* 64*  ALT 27 50* 58* 117* 122*  ALKPHOS 51 65 65 62 65  BILITOT 0.6 0.4 0.5 0.5  0.2*   ------------------------------------------------------------------------------------------------------------------ No results for input(s): CHOL, HDL, LDLCALC, TRIG, CHOLHDL, LDLDIRECT in the last 72 hours.  Lab Results  Component Value Date   HGBA1C 6.7 (H) 12/20/2018   ------------------------------------------------------------------------------------------------------------------ No results for input(s): TSH, T4TOTAL, T3FREE, THYROIDAB in the last 72 hours.  Invalid input(s): FREET3  Cardiac Enzymes No results  for input(s): CKMB, TROPONINI, MYOGLOBIN in the last 168 hours.  Invalid input(s): CK ------------------------------------------------------------------------------------------------------------------ No results found for: BNP  Micro Results Recent Results (from the past 240 hour(s))  Culture, blood (Routine x 2)     Status: None   Collection Time: 12/19/18 10:31 PM   Specimen: BLOOD  Result Value Ref Range Status   Specimen Description BLOOD LEFT ARM  Final   Special Requests   Final    BOTTLES DRAWN AEROBIC AND ANAEROBIC Blood Culture adequate volume   Culture   Final    NO GROWTH 5 DAYS Performed at [facility], [address], [place], [place] [address]    Report Status 12/24/2018 FINAL  Final  SARS Coronavirus 2 (CEPHEID - Performed in [place] hospital lab), Hosp Order     Status: Abnormal   Collection Time: 12/19/18 10:40 PM   Specimen: Nasopharyngeal Swab  Result Value Ref Range Status   SARS Coronavirus 2 POSITIVE (A) NEGATIVE Final    Comment: RESULT CALLED TO, READ BACK BY AND VERIFIED WITH: T [facility] RN 12/19/18 2354 JDW (NOTE) If result is NEGATIVE SARS-CoV-2 target nucleic acids are NOT DETECTED. The SARS-CoV-2 RNA is generally detectable in upper and lower  respiratory specimens during the acute phase of infection. The lowest  concentration of SARS-CoV-2 viral copies this assay can detect is 250  copies / mL. A negative result  does not preclude SARS-CoV-2 infection  and should not be used as the sole basis for treatment or other  patient management decisions.  A negative result may occur with  improper specimen collection / handling, submission of specimen other  than nasopharyngeal swab, presence of viral mutation(s) within the  areas targeted by this assay, and inadequate number of viral copies  (<250 copies / mL). A negative result must be combined with clinical  observations, patient history, and epidemiological information. If result is POSITIVE SARS-CoV-2 target nucleic acids are DETECTED. The SARS -CoV-2 RNA is generally detectable in upper and lower  respiratory specimens during the acute phase of infection.  Positive  results are indicative of active infection with SARS-CoV-2.  Clinical  correlation with patient history and other diagnostic information is  necessary to determine patient infection status.  Positive results do  not rule out bacterial infection or co-infection with other viruses. If result is PRESUMPTIVE POSTIVE SARS-CoV-2 nucleic acids MAY BE PRESENT.   A presumptive positive result was obtained on the submitted specimen  and confirmed on repeat testing.  While 2019 novel coronavirus  (SARS-CoV-2) nucleic acids may be present in the submitted sample  additional confirmatory testing may be necessary for epidemiological  and / or clinical management purposes  to differentiate between  SARS-CoV-2 and other Sarbecovirus currently known to infect humans.  If clinically indicated additional testing with an alternate test  methodology [phone]) is advise d. The SARS-CoV-2 RNA is generally  detectable in upper and lower respiratory specimens during the acute  phase of infection. The expected result is Negative. Fact Sheet for Patients:  StrictlyIdeas.no Fact Sheet for Healthcare Providers: BankingDealers.co.za This test is not yet approved or  cleared by the [place] FDA and has been authorized for detection and/or diagnosis of SARS-CoV-2 by FDA under an Emergency Use Authorization (EUA).  This EUA will remain in effect (meaning this test can be used) for the duration of the COVID-19 declaration under Section 564(b)(1) of the Act, 21 U.S.C. section 360bbb-3(b)(1), unless the authorization is terminated or revoked sooner. Performed at [facility], [place] [address], [place], [place] [address]   Culture, blood (Routine x 2)     Status: None (Preliminary result)   Collection Time: 12/20/18  2:00 AM   Specimen: BLOOD  Result Value Ref Range Status   Specimen Description BLOOD RIGHT ARM  Final   Special Requests   Final    BOTTLES DRAWN AEROBIC AND ANAEROBIC Blood Culture adequate volume   Culture   Final    NO GROWTH 4 DAYS Performed at [facility], [place] [address], [place], [place] [address]    Report Status PENDING  Incomplete    Radiology Reports Ct Abdomen Pelvis Wo Contrast  Result Date: 12/20/2018 CLINICAL DATA:  Acute abdominal pain. COVID-19 positive. EXAM: CT ABDOMEN AND PELVIS WITHOUT CONTRAST TECHNIQUE: Multidetector CT imaging of the abdomen and pelvis was performed following the standard protocol without IV contrast. COMPARISON:  CT 04/28/2007 FINDINGS: Lower chest: Trace right pleural effusion. Right greater than left lower lobe atelectasis. There are coronary artery calcifications. Hepatobiliary: Multiple gallstones within physiologically distended gallbladder. No definite pericholecystic inflammation. No biliary dilatation. Probable hepatic arterial calcifications. No focal lesion on noncontrast exam. Pancreas: No ductal dilatation or inflammation. Spleen: Normal in size without focal abnormality. Adrenals/Urinary Tract: Small left adrenal adenoma. No right adrenal nodule. Symmetric bilateral perinephric edema. No hydronephrosis. No urolithiasis. Urinary bladder is physiologically distended. No bladder  wall thickening. Stomach/Bowel: Small hiatal hernia. Gaseous gastric distension without wall thickening. Air-fluid levels within slightly distended distal small bowel, no definite wall thickening or adjacent inflammation. Fluid/liquid stool throughout the colon with few scattered air-fluid levels. Minor distal colonic diverticulosis without diverticulitis. Formed stool in the rectosigmoid colon with moderate rectosigmoid wall thickening. Mild adjacent perirectal stranding. Normal appendix tentatively visualized. Vascular/Lymphatic: Advanced aortic and branch atherosclerosis. No aneurysm. No bulky abdominopelvic adenopathy. Reproductive: Prominent prostate gland spanning 5.2 cm transverse. Other: No free air. No significant free fluid. Musculoskeletal: Bones are under mineralized. Rule out bilateral inferior pubic rami fractures. Remote right superior pubic ramus fracture. There are no acute or suspicious osseous abnormalities. IMPRESSION: 1. Fluid-filled colon and small bowel with air-fluid levels, as well as gaseous gastric distension, consistent with gastroenteritis/diarrheal process. No obstruction. 2. Rectosigmoid colonic wall thickening with mild pericolonic edema, likely focal proctocolitis. 3. Cholelithiasis without gallbladder inflammation. 4. Advanced aortic and branch atherosclerosis. Aortic Atherosclerosis (ICD10-I70.0). 5. Trace right pleural effusion. Electronically Signed   By: Keith Rake M.D.   On: 12/20/2018 00:39   Dg Chest Port 1 View  Result Date: 12/21/2018 CLINICAL DATA:  Follow-up coronavirus pneumonia EXAM: PORTABLE CHEST 1 VIEW COMPARISON:  12/19/2018 FINDINGS: Patchy areas of bilateral pneumonia persists, most pronounced in the right perihilar lung. Allowing for technical factors, there is slightly worsened consolidation particularly in the right perihilar region. No lobar involvement. No lobar collapse. No effusion. Previous CABG with cardiomegaly IMPRESSION: Persistent patchy  bilateral infiltrates, most pronounced in the right perihilar region, where the pulmonary density is somewhat worsened. Electronically Signed   By: Nelson Chimes M.D.   On: 12/21/2018 08:08   Dg Chest Portable 1 View  Result Date: 12/19/2018 CLINICAL DATA:  Suspected sepsis. EXAM: PORTABLE CHEST 1 VIEW COMPARISON:  March 31, 2018 FINDINGS: Probable skin fold over the right lung base. Stable cardiomegaly. The hila and mediastinum are unremarkable. No pneumothorax. The left lung is clear. Subtle opacity in the left mid lower lung is identified. No other acute abnormalities. IMPRESSION: Subtle opacity in the right mid lower lung may represent atelectasis or developing infiltrate. No  other acute abnormalities. Electronically Signed   By: Dorise Bullion III M.D   On: 12/19/2018 22:45

## 2018-12-25 NOTE — Progress Notes (Signed)
Physical Therapy Treatment Patient Details Name: ABHAY GODBOLT MRN: [id number] DOB: 02-Oct-1945 Today's Date: 12/25/2018    History of Present Illness TODD JELINSKI is a 73 y.o. male with medical history significant for  Alzheimer dementia, history of CVA, coronary artery disease, type 2 diabetes mellitus, and hypertension, now presenting from his nursing facility for evaluation of fever, positive for covid-19, gastritis, hypoxia..    PT Comments    The patient is very pleasant. Ambulated in room using Rw and 1 assist. Continue PT/mobility.   Follow Up Recommendations  SNF     Equipment Recommendations  None recommended by PT    Recommendations for Other Services       Precautions / Restrictions Precautions Precautions: Fall    Mobility  Bed Mobility   Bed Mobility: Supine to Sit     Supine to sit: HOB elevated;Min assist     General bed mobility comments: Min A for elevating trunk  Transfers Overall transfer level: Needs assistance Equipment used: Rolling walker (2 wheeled) Transfers: Sit to/from Stand Sit to Stand: Min guard         General transfer comment: Min Guard A for safety   Ambulation/Gait Ambulation/Gait assistance: Herbalist (Feet): 30 Feet Assistive device: Rolling walker (2 wheeled) Gait Pattern/deviations: Step-to pattern;Trunk flexed Gait velocity: decr   General Gait Details: steady assist during turns around corners/obstacles and backing up   Stairs             Wheelchair Mobility    Modified Rankin (Stroke Patients Only)       Balance Overall balance assessment: Needs assistance   Sitting balance-Leahy Scale: Fair     Standing balance support: Bilateral upper extremity supported;During functional activity Standing balance-Leahy Scale: Poor                              Cognition   Behavior During Therapy: WFL for tasks assessed/performed Overall Cognitive Status: History of cognitive  impairments - at baseline                                        Exercises      General Comments        Pertinent Vitals/Pain Pain Assessment: No/denies pain    Home Living                      Prior Function            PT Goals (current goals can now be found in the care plan section)      Frequency    Min 2X/week      PT Plan Current plan remains appropriate    Co-evaluation              AM-PAC PT "6 Clicks" Mobility   Outcome Measure  Help needed turning from your back to your side while in a flat bed without using bedrails?: A Little Help needed moving from lying on your back to sitting on the side of a flat bed without using bedrails?: A Little Help needed moving to and from a bed to a chair (including a wheelchair)?: A Little Help needed standing up from a chair using your arms (e.g., wheelchair or bedside chair)?: A Little Help needed to walk in hospital room?: A Lot Help needed climbing 3-5 steps  with a railing? : A Lot 6 Click Score: 16    End of Session Equipment Utilized During Treatment: Gait belt Activity Tolerance: Patient tolerated treatment well Patient left: in chair;with call bell/phone within reach;with chair alarm set Nurse Communication: Mobility status PT Visit Diagnosis: Unsteadiness on feet (R26.81)     Time: 0258-5277 PT Time Calculation (min) (ACUTE ONLY): 15 min  Charges:  $Gait Training: 8-22 mins                     Tresa Endo Fenton  Office [phone]    Claretha Cooper 12/25/2018, 2:58 PM

## 2018-12-25 NOTE — TOC Initial Note (Signed)
Transition of Care [facility]) - Initial/Assessment Note    Patient Details  Name: [name] MRN: [id number] Date of Birth: 06-19-1945  Transition of Care [facility]) CM/SW Contact:    Alberteen Sam, [place] Phone Number: [phone] 12/25/2018, 10:24 AM  Clinical Narrative:                  CSW consulted with patient's sister Blanch Media regarding patient discharging to SNF for short term rehab before returning to [facility], she is in agreement. CSW informed her of bed offer at [facility], she is accepting of this as SNF choice. Ronney Lion has been informed and started [employer] on this day.   CSW attempted to call [facility] to inform of plan for patient to go to [facility] before returning to them, no answer, lvm.   Expected Discharge Plan: Skilled Nursing Facility Barriers to Discharge: Continued Medical Work up   Patient Goals and CMS Choice   CMS Medicare.gov Compare Post Acute Care list provided to:: Patient Represenative (must comment)(Joyce (sister)) Choice offered to / list presented to : Sibling  Expected Discharge Plan and Services Expected Discharge Plan: [place] Acute Care Choice: [place] Living arrangements for the past 2 months: Assisted Living Facility([facility])                                      Prior Living Arrangements/Services Living arrangements for the past 2 months: Assisted Living Facility([facility]) Lives with:: Self Patient language and need for interpreter reviewed:: Yes Do you feel safe going back to the place where you live?: No   needs short term rehab before returning to [facility]  Need for Family Participation in Patient Care: Yes (Comment) Care giver support system in place?: Yes (comment)   Criminal Activity/Legal Involvement Pertinent to Current Situation/Hospitalization: No - Comment as needed  Activities of Daily Living Home Assistive Devices/Equipment: None(live  in an assisant living) ADL Screening (condition at time of admission) Patient's cognitive ability adequate to safely complete daily activities?: No Is the patient deaf or have difficulty hearing?: No Does the patient have difficulty seeing, even when wearing glasses/contacts?: Yes Does the patient have difficulty concentrating, remembering, or making decisions?: Yes Patient able to express need for assistance with ADLs?: No Does the patient have difficulty dressing or bathing?: No Independently performs ADLs?: No Communication: Independent Is this a change from baseline?: Pre-admission baseline Dressing (OT): Dependent Is this a change from baseline?: Pre-admission baseline Grooming: Dependent Is this a change from baseline?: Change from baseline, expected to last <3 days Feeding: Needs assistance Is this a change from baseline?: Pre-admission baseline Bathing: Dependent Is this a change from baseline?: Pre-admission baseline Toileting: Dependent Is this a change from baseline?: Pre-admission baseline In/Out Bed: Needs assistance Is this a change from baseline?: Pre-admission baseline Walks in Home: Needs assistance Is this a change from baseline?: Pre-admission baseline Does the patient have difficulty walking or climbing stairs?: Yes Weakness of Legs: Both Weakness of Arms/Hands: None  Permission Sought/Granted Permission sought to share information with : Case Manager, Customer service manager, Family Supports Permission granted to share information with : Yes, Verbal Permission Granted  Share Information with NAME: Blanch Media  Permission granted to share info w AGENCY: SNFs  Permission granted to share info w Relationship: sister  Permission granted to share info w Contact Information: [phone]  Emotional Assessment Appearance:: (unable to assess - remote) Attitude/Demeanor/Rapport: Unable to Assess(remote) Affect (typically observed): Unable to  Assess(remote) Orientation: : Oriented to Self Alcohol / Substance Use: Not Applicable Psych Involvement: No (comment)  Admission diagnosis:  Community acquired pneumonia of right middle lobe of lung ([place]) [J18.1] COVID-19 [U07.1, J98.8] Patient Active Problem List   Diagnosis Date Noted  . Occult GI bleeding 12/20/2018  . Gastroenteritis due to COVID-19 virus 12/20/2018  . Renal insufficiency 12/20/2018  . Normocytic anemia 12/20/2018  . Alzheimer's dementia ([place]) 12/20/2018  . Primary open angle glaucoma (POAG) of both eyes, severe stage 04/28/2018  . Pseudophakia of both eyes 04/28/2018  . CORONARY ARTERY BYPASS GRAFT, HX OF 05/25/2006  . Type II diabetes mellitus ([place]) 04/13/2006  . HYPERCHOLESTEROLEMIA 04/13/2006  . INCREASED INTRAOCULAR PRESSURE 04/13/2006  . HYPERTENSION 04/13/2006  . CORONARY ARTERY DISEASE 04/13/2006  . INSOMNIA 04/13/2006  . EPIGASTRIC PAIN 04/13/2006  . History of CVA (cerebrovascular accident) 04/13/2006  . GLAUCOMA, HX OF 04/13/2006   PCP:  Sande Brothers, MD Pharmacy:  No Pharmacies Listed    Social Determinants of Health (SDOH) Interventions    Readmission Risk Interventions No flowsheet data found.

## 2018-12-25 NOTE — NC FL2 (Signed)
[place] LEVEL OF CARE SCREENING TOOL     IDENTIFICATION  Patient Name: [name] Birthdate: Oct 01, 1945 Sex: male Admission Date (Current Location): 12/19/2018  [facility] and [place] Number:  Herbalist and Address:  [facility], [place] [address] [place], [place] [address]      Provider Number: [id number]  Attending Physician Name and Address:  Thurnell Lose, MD  Relative Name and Phone Number:  Blanch Media (CVELFY)[phone]    Current Level of Care: Hospital Recommended Level of Care: [place] Prior Approval Number:    Date Approved/Denied:   PASRR Number: [id number]  Discharge Plan: SNF    Current Diagnoses: Patient Active Problem List   Diagnosis Date Noted  . Occult GI bleeding 12/20/2018  . Gastroenteritis due to COVID-19 virus 12/20/2018  . Renal insufficiency 12/20/2018  . Normocytic anemia 12/20/2018  . Alzheimer's dementia ([place]) 12/20/2018  . Primary open angle glaucoma (POAG) of both eyes, severe stage 04/28/2018  . Pseudophakia of both eyes 04/28/2018  . CORONARY ARTERY BYPASS GRAFT, HX OF 05/25/2006  . Type II diabetes mellitus ([place]) 04/13/2006  . HYPERCHOLESTEROLEMIA 04/13/2006  . INCREASED INTRAOCULAR PRESSURE 04/13/2006  . HYPERTENSION 04/13/2006  . CORONARY ARTERY DISEASE 04/13/2006  . INSOMNIA 04/13/2006  . EPIGASTRIC PAIN 04/13/2006  . History of CVA (cerebrovascular accident) 04/13/2006  . GLAUCOMA, HX OF 04/13/2006    Orientation RESPIRATION BLADDER Height & Weight     Self  Normal Incontinent, External catheter Weight: 166 lb 0.1 oz (75.3 kg) Height:  5\' 11"  (180.3 cm)  BEHAVIORAL SYMPTOMS/MOOD NEUROLOGICAL BOWEL NUTRITION STATUS      Continent Diet(carb modified, see discharge summary)  AMBULATORY STATUS COMMUNICATION OF NEEDS Skin   Extensive Assist Verbally Normal                       Personal Care Assistance Level of Assistance  Bathing, Feeding, Dressing,  Total care Bathing Assistance: Maximum assistance Feeding assistance: Independent(able to feed self with adaptive devices) Dressing Assistance: Maximum assistance Total Care Assistance: Maximum assistance   Functional Limitations Info  Sight, Hearing, Speech Sight Info: Adequate Hearing Info: Adequate Speech Info: Adequate    SPECIAL CARE FACTORS FREQUENCY  PT (By licensed PT), OT (By licensed OT)     PT Frequency: min 5x weekly OT Frequency: min 5x weekly            Contractures Contractures Info: Not present    Additional Factors Info  Code Status, Allergies, Isolation Precautions Code Status Info: full Allergies Info: No Known Allergies     Isolation Precautions Info: Emerging Pathogen, air and contact precautions     Current Medications (12/25/2018):  This is the current hospital active medication list Current Facility-Administered Medications  Medication Dose Route Frequency Provider Last Rate Last Dose  . acetaminophen (TYLENOL) tablet 650 mg  650 mg Oral Q6H PRN Cherene Altes, MD      . aspirin chewable tablet 81 mg  81 mg Oral Daily Thurnell Lose, MD   81 mg at 12/25/18 0903  . atorvastatin (LIPITOR) tablet 40 mg  40 mg Oral QHS Thurnell Lose, MD   40 mg at 12/24/18 2111  . carvedilol (COREG) tablet 3.125 mg  3.125 mg Oral BID Cherene Altes, MD   3.125 mg at 12/24/18 0911  . divalproex (DEPAKOTE SPRINKLE) capsule 125 mg  125 mg Oral BID Little Ishikawa, MD   125 mg at 12/25/18 3614  .  doxepin (SINEQUAN) capsule 10 mg  10 mg Oral QHS Cherene Altes, MD   10 mg at 12/24/18 2112  . escitalopram (LEXAPRO) tablet 10 mg  10 mg Oral Daily Cherene Altes, MD   10 mg at 12/25/18 0905  . heparin injection 5,000 Units  5,000 Units Subcutaneous Q8H Thurnell Lose, MD   5,000 Units at 12/25/18 0518  . insulin aspart (novoLOG) injection 0-15 Units  0-15 Units Subcutaneous TID WC Little Ishikawa, MD   3 Units at 12/25/18 [phone]  . insulin  aspart (novoLOG) injection 0-5 Units  0-5 Units Subcutaneous QHS Cherene Altes, MD   2 Units at 12/24/18 2109  . insulin glargine (LANTUS) injection 15 Units  15 Units Subcutaneous Q24H [name] K, MD      . lactated ringers infusion   Intravenous Continuous Thurnell Lose, MD      . mirtazapine (REMERON) tablet 7.5 mg  7.5 mg Oral QHS Thurnell Lose, MD   7.5 mg at 12/24/18 2112  . ondansetron (ZOFRAN) injection 4 mg  4 mg Intravenous Q6H PRN Hosie Poisson, MD      . pantoprazole (PROTONIX) EC tablet 40 mg  40 mg Oral Daily Thurnell Lose, MD   40 mg at 12/25/18 0903  . remdesivir 100 mg in sodium chloride 0.9 % 250 mL IVPB  100 mg Intravenous Q24H Little Ishikawa, MD 500 mL/hr at 12/24/18 1500 100 mg at 12/24/18 1500     Discharge Medications: Please see discharge summary for a list of discharge medications.  Relevant Imaging Results:  Relevant Lab Results:   Additional Information SSN: [id number]  Alberteen Sam, LCSW

## 2018-12-25 NOTE — Progress Notes (Signed)
CSW has faxed out referral for SNF to both [place], they are reviewing chart. CSW will inform family of which options and start insurance auth with [facility] today.   [place], [place]

## 2018-12-26 DIAGNOSIS — U071 COVID-19: Secondary | ICD-10-CM | POA: Diagnosis not present

## 2018-12-26 DIAGNOSIS — N390 Urinary tract infection, site not specified: Secondary | ICD-10-CM | POA: Diagnosis not present

## 2018-12-26 DIAGNOSIS — R2689 Other abnormalities of gait and mobility: Secondary | ICD-10-CM | POA: Diagnosis not present

## 2018-12-26 DIAGNOSIS — D649 Anemia, unspecified: Secondary | ICD-10-CM | POA: Diagnosis not present

## 2018-12-26 DIAGNOSIS — R278 Other lack of coordination: Secondary | ICD-10-CM | POA: Diagnosis not present

## 2018-12-26 DIAGNOSIS — R2681 Unsteadiness on feet: Secondary | ICD-10-CM | POA: Diagnosis not present

## 2018-12-26 DIAGNOSIS — Z7189 Other specified counseling: Secondary | ICD-10-CM | POA: Diagnosis not present

## 2018-12-26 DIAGNOSIS — I639 Cerebral infarction, unspecified: Secondary | ICD-10-CM | POA: Diagnosis not present

## 2018-12-26 DIAGNOSIS — R41841 Cognitive communication deficit: Secondary | ICD-10-CM | POA: Diagnosis not present

## 2018-12-26 DIAGNOSIS — M6281 Muscle weakness (generalized): Secondary | ICD-10-CM | POA: Diagnosis not present

## 2018-12-26 DIAGNOSIS — I959 Hypotension, unspecified: Secondary | ICD-10-CM | POA: Diagnosis not present

## 2018-12-26 DIAGNOSIS — I6389 Other cerebral infarction: Secondary | ICD-10-CM | POA: Diagnosis not present

## 2018-12-26 DIAGNOSIS — N179 Acute kidney failure, unspecified: Secondary | ICD-10-CM | POA: Diagnosis not present

## 2018-12-26 DIAGNOSIS — J181 Lobar pneumonia, unspecified organism: Secondary | ICD-10-CM | POA: Diagnosis not present

## 2018-12-26 DIAGNOSIS — A0839 Other viral enteritis: Secondary | ICD-10-CM | POA: Diagnosis not present

## 2018-12-26 DIAGNOSIS — Z209 Contact with and (suspected) exposure to unspecified communicable disease: Secondary | ICD-10-CM | POA: Diagnosis not present

## 2018-12-26 DIAGNOSIS — N289 Disorder of kidney and ureter, unspecified: Secondary | ICD-10-CM | POA: Diagnosis not present

## 2018-12-26 DIAGNOSIS — D6489 Other specified anemias: Secondary | ICD-10-CM | POA: Diagnosis not present

## 2018-12-26 DIAGNOSIS — Z7401 Bed confinement status: Secondary | ICD-10-CM | POA: Diagnosis not present

## 2018-12-26 DIAGNOSIS — A084 Viral intestinal infection, unspecified: Secondary | ICD-10-CM | POA: Diagnosis not present

## 2018-12-26 DIAGNOSIS — Z79899 Other long term (current) drug therapy: Secondary | ICD-10-CM | POA: Diagnosis not present

## 2018-12-26 DIAGNOSIS — M2550 Pain in unspecified joint: Secondary | ICD-10-CM | POA: Diagnosis not present

## 2018-12-26 DIAGNOSIS — E119 Type 2 diabetes mellitus without complications: Secondary | ICD-10-CM | POA: Diagnosis not present

## 2018-12-26 DIAGNOSIS — F0281 Dementia in other diseases classified elsewhere with behavioral disturbance: Secondary | ICD-10-CM | POA: Diagnosis not present

## 2018-12-26 DIAGNOSIS — I10 Essential (primary) hypertension: Secondary | ICD-10-CM | POA: Diagnosis not present

## 2018-12-26 LAB — GLUCOSE, CAPILLARY
Glucose-Capillary: 161 mg/dL — ABNORMAL HIGH (ref 70–99)
Glucose-Capillary: 160 mg/dL — ABNORMAL HIGH (ref 70–99)

## 2018-12-26 MED ORDER — FUROSEMIDE 40 MG PO TABS: 40.0000 mg | ORAL_TABLET | Freq: Every day | ORAL | Status: AC

## 2018-12-26 NOTE — Discharge Instructions (Signed)
Follow with Primary MD Sande Brothers, MD in 7 days   Get CBC, CMP, 2 view Chest X ray -  checked next visit within 1 week by Primary MD or SNF MD    Activity: As tolerated with Full fall precautions use walker/cane & assistance as needed  Disposition SNF  Diet: Heart Healthy Low Carb, with feeding assistance and aspiration precautions.  Accuchecks 4 times/day, Once in AM empty stomach and then before each meal. Log in all results and show them to your Prim.MD in 3 days. If any glucose reading is under 80 or above 300 call your Prim MD immidiately. Follow Low glucose instructions for glucose under 80 as instructed.    Special Instructions: If you have smoked or chewed Tobacco  in the last 2 yrs please stop smoking, stop any regular Alcohol  and or any Recreational drug use.  On your next visit with your primary care physician please Get Medicines reviewed and adjusted.  Please request your Prim.MD to go over all Hospital Tests and Procedure/Radiological results at the follow up, please get all Hospital records sent to your Prim MD by signing hospital release before you go home.  If you experience worsening of your admission symptoms, develop shortness of breath, life threatening emergency, suicidal or homicidal thoughts you must seek medical attention immediately by calling 911 or calling your MD immediately  if symptoms less severe.  You Must read complete instructions/literature along with all the possible adverse reactions/side effects for all the Medicines you take and that have been prescribed to you. Take any new Medicines after you have completely understood and accpet all the possible adverse reactions/side effects.

## 2018-12-26 NOTE — Discharge Summary (Signed)
[name]:[id number] DOB: 1945/11/22 DOA: 12/19/2018  PCP: Sande Brothers, MD  Admit date: 12/19/2018  Discharge date: 12/26/2018  Admitted From: SNF   Disposition:  SNF   Recommendations for Outpatient Follow-up:   Follow up with PCP in 1-2 weeks  PCP Please obtain BMP/CBC, 2 view CXR in 1week,  (see Discharge instructions)   PCP Please follow up on the following pending results:    Home Health: None   Equipment/Devices: None  Consultations: None  Discharge Condition: Stable    CODE STATUS: Full    Diet Recommendation: Heart Healthy Low Carb     Chief Complaint  Patient presents with   Fever     Brief history of present illness from the day of admission and additional interim summary    Brief Narrative  Shannon Trautman Reedyis a 73 y.o.malewith medical history significant for Alzheimer dementia, history of CVA, coronary artery disease, type 2 diabetes mellitus, and hypertension, now presenting from his nursing facility for evaluation of fever, he was diagnosed with Covid 19 Gastroenteritis + PNA and admitted.                                                                  Hospital Course   1. Gastroenteritis + Acute Covid 19 Viral Pneumonitis during the ongoing 2020 Covid 19 Pandemic - resolved Resp symptoms, GI symptoms almost completely resolved, Inflamm markers better, steroids tapered off and he has finished his Remdisvir.  2.  Transaminitis - due to Covid 19 infection along with Remdisvir use, trend, no GI discomfort.  Repeat CMP in a week at SNF.  3. AOCD stable and H&H uptrending, no GIB here. PCP to monitor arrange for one-time outpatient GI follow-up.  4. CAD and underlying history of CVA - stable, resume ASA, Lipitor and Coreg for secondary prevention.  5. HTN  - BP was soft hence  continue Norvasc only ACE inhibitor discontinued..  6. Alz Dementia -at risk for delirium, home dementia medications continued, minimize narcotics benzodiazepines and monitor.  7. Deconditioning.  PT.  Placement to SNF.  8.  AKI.    Likely due to mild dehydration and hypotension causing ATN, has been hydrated, blood pressure medications have been adjusted to keep blood pressure borderline elevated, discontinue ACE inhibitor, skip Lasix for 2 days, repeat CMP at SNF in a week.  9. DM2 - continue home regimen with CBGs checked QA CHS at SNF.  Discharge diagnosis     Principal Problem:   Gastroenteritis due to COVID-19 virus Active Problems:   Type II diabetes mellitus (HCC)   History of CVA (cerebrovascular accident)   CORONARY ARTERY BYPASS GRAFT, HX OF   Occult GI bleeding   Renal insufficiency   Normocytic anemia   Alzheimer's dementia ([place])    Discharge instructions  Discharge Instructions    Discharge instructions   Complete by: As directed    Follow with Primary MD Sande Brothers, MD in 7 days   Get CBC, CMP, 2 view Chest X ray -  checked next visit within 1 week by Primary MD or SNF MD    Activity: As tolerated with Full fall precautions use walker/cane & assistance as needed  Disposition SNF  Diet: Heart Healthy Low Carb, with feeding assistance and aspiration precautions.  Accuchecks 4 times/day, Once in AM empty stomach and then before each meal. Log in all results and show them to your Prim.MD in 3 days. If any glucose reading is under 80 or above 300 call your Prim MD immidiately. Follow Low glucose instructions for glucose under 80 as instructed.    Special Instructions: If you have smoked or chewed Tobacco  in the last 2 yrs please stop smoking, stop any regular Alcohol  and or any Recreational drug use.  On your next visit with your primary care physician please Get Medicines reviewed and adjusted.  Please request your Prim.MD to go over all  Hospital Tests and Procedure/Radiological results at the follow up, please get all Hospital records sent to your Prim MD by signing hospital release before you go home.  If you experience worsening of your admission symptoms, develop shortness of breath, life threatening emergency, suicidal or homicidal thoughts you must seek medical attention immediately by calling 911 or calling your MD immediately  if symptoms less severe.   Increase activity slowly   Complete by: As directed       Discharge Medications   Allergies as of 12/26/2018   No Known Allergies     Medication List    STOP taking these medications   lisinopril 5 MG tablet Commonly known as: ZESTRIL   traMADol 50 MG tablet Commonly known as: ULTRAM     TAKE these medications   acetaminophen 325 MG tablet Commonly known as: TYLENOL Take 650 mg by mouth 2 (two) times daily.   aspirin 81 MG chewable tablet Chew 81 mg by mouth daily.   atorvastatin 40 MG tablet Commonly known as: LIPITOR Take 40 mg by mouth at bedtime.   carvedilol 3.125 MG tablet Commonly known as: COREG Take 3.125 mg by mouth 2 (two) times daily.   divalproex 125 MG capsule Commonly known as: DEPAKOTE SPRINKLE Take 125 mg by mouth 2 (two) times a day.   dorzolamide-timolol 22.3-6.8 MG/ML ophthalmic solution Commonly known as: COSOPT Place 1 drop into the right eye 2 (two) times a day.   escitalopram 10 MG tablet Commonly known as: LEXAPRO Take 10 mg by mouth daily.   ferrous gluconate 324 MG tablet Commonly known as: FERGON Take 324 mg by mouth daily.   furosemide 40 MG tablet Commonly known as: LASIX Take 1 tablet (40 mg total) by mouth daily. Start taking on: December 28, 2018 What changed: These instructions start on December 28, 2018. If you are unsure what to do until then, ask your doctor or other care provider.   Lantus 100 UNIT/ML injection Generic drug: insulin glargine Inject 11 Units into the skin every morning.   latanoprost  0.005 % ophthalmic solution Commonly known as: XALATAN Place 1 drop into both eyes at bedtime.   metFORMIN 1000 MG tablet Commonly known as: GLUCOPHAGE Take 1,000 mg by mouth 2 (two) times daily.   mirtazapine 7.5 MG tablet Commonly known as: REMERON Take 7.5 mg by mouth daily.  Contact information for follow-up providers    Sande Brothers, MD. Schedule an appointment as soon as possible for a visit in 1 week(s).   Specialty: Internal Medicine Contact information: [address] [place] [address] [phone]            Contact information for after-discharge care    Destination    [facility] .   Service: Skilled Nursing Contact information: [place] [phone]                  Major procedures and Radiology Reports - PLEASE review detailed and final reports thoroughly  -       Ct Abdomen Pelvis Wo Contrast  Result Date: 12/20/2018 CLINICAL DATA:  Acute abdominal pain. COVID-19 positive. EXAM: CT ABDOMEN AND PELVIS WITHOUT CONTRAST TECHNIQUE: Multidetector CT imaging of the abdomen and pelvis was performed following the standard protocol without IV contrast. COMPARISON:  CT 04/28/2007 FINDINGS: Lower chest: Trace right pleural effusion. Right greater than left lower lobe atelectasis. There are coronary artery calcifications. Hepatobiliary: Multiple gallstones within physiologically distended gallbladder. No definite pericholecystic inflammation. No biliary dilatation. Probable hepatic arterial calcifications. No focal lesion on noncontrast exam. Pancreas: No ductal dilatation or inflammation. Spleen: Normal in size without focal abnormality. Adrenals/Urinary Tract: Small left adrenal adenoma. No right adrenal nodule. Symmetric bilateral perinephric edema. No hydronephrosis. No urolithiasis. Urinary bladder is physiologically distended. No bladder wall thickening. Stomach/Bowel: Small hiatal hernia.  Gaseous gastric distension without wall thickening. Air-fluid levels within slightly distended distal small bowel, no definite wall thickening or adjacent inflammation. Fluid/liquid stool throughout the colon with few scattered air-fluid levels. Minor distal colonic diverticulosis without diverticulitis. Formed stool in the rectosigmoid colon with moderate rectosigmoid wall thickening. Mild adjacent perirectal stranding. Normal appendix tentatively visualized. Vascular/Lymphatic: Advanced aortic and branch atherosclerosis. No aneurysm. No bulky abdominopelvic adenopathy. Reproductive: Prominent prostate gland spanning 5.2 cm transverse. Other: No free air. No significant free fluid. Musculoskeletal: Bones are under mineralized. Rule out bilateral inferior pubic rami fractures. Remote right superior pubic ramus fracture. There are no acute or suspicious osseous abnormalities. IMPRESSION: 1. Fluid-filled colon and small bowel with air-fluid levels, as well as gaseous gastric distension, consistent with gastroenteritis/diarrheal process. No obstruction. 2. Rectosigmoid colonic wall thickening with mild pericolonic edema, likely focal proctocolitis. 3. Cholelithiasis without gallbladder inflammation. 4. Advanced aortic and branch atherosclerosis. Aortic Atherosclerosis (ICD10-I70.0). 5. Trace right pleural effusion. Electronically Signed   By: Keith Rake M.D.   On: 12/20/2018 00:39   Dg Chest Port 1 View  Result Date: 12/21/2018 CLINICAL DATA:  Follow-up coronavirus pneumonia EXAM: PORTABLE CHEST 1 VIEW COMPARISON:  12/19/2018 FINDINGS: Patchy areas of bilateral pneumonia persists, most pronounced in the right perihilar lung. Allowing for technical factors, there is slightly worsened consolidation particularly in the right perihilar region. No lobar involvement. No lobar collapse. No effusion. Previous CABG with cardiomegaly IMPRESSION: Persistent patchy bilateral infiltrates, most pronounced in the right  perihilar region, where the pulmonary density is somewhat worsened. Electronically Signed   By: Nelson Chimes M.D.   On: 12/21/2018 08:08   Dg Chest Portable 1 View  Result Date: 12/19/2018 CLINICAL DATA:  Suspected sepsis. EXAM: PORTABLE CHEST 1 VIEW COMPARISON:  March 31, 2018 FINDINGS: Probable skin fold over the right lung base. Stable cardiomegaly. The hila and mediastinum are unremarkable. No pneumothorax. The left lung is clear. Subtle opacity in the left mid lower lung is identified. No other acute abnormalities. IMPRESSION: Subtle opacity in the right  mid lower lung may represent atelectasis or developing infiltrate. No other acute abnormalities. Electronically Signed   By: Dorise Bullion III M.D   On: 12/19/2018 22:45    Micro Results    Recent Results (from the past 240 hour(s))  Culture, blood (Routine x 2)     Status: None   Collection Time: 12/19/18 10:31 PM   Specimen: BLOOD  Result Value Ref Range Status   Specimen Description BLOOD LEFT ARM  Final   Special Requests   Final    BOTTLES DRAWN AEROBIC AND ANAEROBIC Blood Culture adequate volume   Culture   Final    NO GROWTH 5 DAYS Performed at [facility], [address], [place], [place] [address]    Report Status 12/24/2018 FINAL  Final  SARS Coronavirus 2 (CEPHEID - Performed in [place] hospital lab), Hosp Order     Status: Abnormal   Collection Time: 12/19/18 10:40 PM   Specimen: Nasopharyngeal Swab  Result Value Ref Range Status   SARS Coronavirus 2 POSITIVE (A) NEGATIVE Final    Comment: RESULT CALLED TO, READ BACK BY AND VERIFIED WITH: [facility] RN 12/19/18 2354 JDW (NOTE) If result is NEGATIVE SARS-CoV-2 target nucleic acids are NOT DETECTED. The SARS-CoV-2 RNA is generally detectable in upper and lower  respiratory specimens during the acute phase of infection. The lowest  concentration of SARS-CoV-2 viral copies this assay can detect is 250  copies / mL. A negative result does not preclude  SARS-CoV-2 infection  and should not be used as the sole basis for treatment or other  patient management decisions.  A negative result may occur with  improper specimen collection / handling, submission of specimen other  than nasopharyngeal swab, presence of viral mutation(s) within the  areas targeted by this assay, and inadequate number of viral copies  (<250 copies / mL). A negative result must be combined with clinical  observations, patient history, and epidemiological information. If result is POSITIVE SARS-CoV-2 target nucleic acids are DETECTED. The SARS -CoV-2 RNA is generally detectable in upper and lower  respiratory specimens during the acute phase of infection.  Positive  results are indicative of active infection with SARS-CoV-2.  Clinical  correlation with patient history and other diagnostic information is  necessary to determine patient infection status.  Positive results do  not rule out bacterial infection or co-infection with other viruses. If result is PRESUMPTIVE POSTIVE SARS-CoV-2 nucleic acids MAY BE PRESENT.   A presumptive positive result was obtained on the submitted specimen  and confirmed on repeat testing.  While 2019 novel coronavirus  (SARS-CoV-2) nucleic acids may be present in the submitted sample  additional confirmatory testing may be necessary for epidemiological  and / or clinical management purposes  to differentiate between  SARS-CoV-2 and other Sarbecovirus currently known to infect humans.  If clinically indicated additional testing with an alternate test  methodology [phone]) is advise d. The SARS-CoV-2 RNA is generally  detectable in upper and lower respiratory specimens during the acute  phase of infection. The expected result is Negative. Fact Sheet for Patients:  StrictlyIdeas.no Fact Sheet for Healthcare Providers: BankingDealers.co.za This test is not yet approved or cleared by the  [place] FDA and has been authorized for detection and/or diagnosis of SARS-CoV-2 by FDA under an Emergency Use Authorization (EUA).  This EUA will remain in effect (meaning this test can be used) for the duration of the COVID-19 declaration under Section 564(b)(1) of the Act, 21 U.S.C. section  360bbb-3(b)(1), unless the authorization is terminated or revoked sooner. Performed at [facility], [place] [address] [place], [place] [address]   Culture, blood (Routine x 2)     Status: None   Collection Time: 12/20/18  2:00 AM   Specimen: BLOOD  Result Value Ref Range Status   Specimen Description BLOOD RIGHT ARM  Final   Special Requests   Final    BOTTLES DRAWN AEROBIC AND ANAEROBIC Blood Culture adequate volume   Culture   Final    NO GROWTH 5 DAYS Performed at [facility], [address], [place], [place] [address]    Report Status 12/25/2018 FINAL  Final    Today   Subjective    Richard Ritchey today has no headache,no chest abdominal pain,no new weakness tingling or numbness, feels much better      Objective   Blood pressure (!) 161/75, pulse (!) 56, temperature 98 F (36.7 C), temperature source Oral, resp. rate 18, height 5\' 11"  (1.803 m), weight 75.3 kg, SpO2 97 %.   Intake/Output Summary (Last 24 hours) at 12/26/2018 1039 Last data filed at 12/26/2018 1007 Gross per 24 hour  Intake 579.99 ml  Output 800 ml  Net -220.01 ml    Exam  Awake Alert,   No new F.N deficits, Normal affect Reserve.AT,PERRAL Supple Neck,No JVD, No cervical lymphadenopathy appriciated.  Symmetrical Chest wall movement, Good air movement bilaterally, CTAB RRR,No Gallops,Rubs or new Murmurs, No Parasternal Heave +ve B.Sounds, Abd Soft, Non tender, No organomegaly appriciated, No rebound -guarding or rigidity. No Cyanosis, Clubbing or edema, No new Rash or bruise   Data Review   CBC w Diff:  Lab Results  Component Value Date   WBC 13.7 (H) 12/25/2018   HGB 9.1 (L) 12/25/2018    HCT 28.3 (L) 12/25/2018   PLT 248 12/25/2018   LYMPHOPCT 19 12/25/2018   MONOPCT 11 12/25/2018   EOSPCT 0 12/25/2018   BASOPCT 0 12/25/2018    CMP:  Lab Results  Component Value Date   NA 138 12/25/2018   K 4.4 12/25/2018   CL 102 12/25/2018   CO2 25 12/25/2018   BUN 58 (H) 12/25/2018   CREATININE 1.42 (H) 12/25/2018   PROT 6.0 (L) 12/25/2018   ALBUMIN 2.8 (L) 12/25/2018   BILITOT 0.2 (L) 12/25/2018   ALKPHOS 65 12/25/2018   AST 64 (H) 12/25/2018   ALT 122 (H) 12/25/2018  . COVID-19 Labs  Recent Labs    12/24/18 0410 12/25/18 0300 12/25/18 0500  DDIMER 1.43*  --  1.70*  FERRITIN 269 251  --   LDH 234*  --  192  CRP 1.3* 0.9  --     Lab Results  Component Value Date   SARSCOV2NAA POSITIVE (A) 12/19/2018     Total Time in preparing paper work, data evaluation and todays exam - 68 minutes  [name] M.D on 12/26/2018 at 10:39 AM  [facility]   Office  [phone]

## 2018-12-26 NOTE — TOC Transition Note (Addendum)
Transition of Care [facility]) - CM/SW Discharge Note   Patient Details  Name: [name] MRN: [id number] Date of Birth: April 02, 1946  [facility] CM/SW Contact:  Alberteen Sam, LCSW Phone Number: 12/26/2018, 11:07 AM   Clinical Narrative:     Patient will DC OV:FIEPPI Anticipated DC date: 12/26/2018 Family notified:Joyce Transport RJ:JOAC  Per MD patient ready for DC to [facility] . RN, patient, patient's family, and facility notified of DC. Discharge Summary sent to facility. RN given number for report  [phone] Room 806A . DC packet on chart. Ambulance transport requested for patient for 1:00 pm.  CSW signing off.  [place], [place]   Final next level of care: Skilled Nursing Facility Barriers to Discharge: No Barriers Identified   Patient Goals and CMS Choice   CMS Medicare.gov Compare Post Acute Care list provided to:: Patient Represenative (must comment)(sister Blanch Media) Choice offered to / list presented to : Sibling(sister Blanch Media)  Discharge Placement PASRR number recieved: 12/25/18            [facility] [address] Patient to be transferred to facility by: [place] Name of family member notified: Blanch Media Patient and family notified of of transfer: 12/26/18  Discharge Plan and Services     Post Acute Care Choice: [place]                               Social Determinants of Health (SDOH) Interventions     Readmission Risk Interventions No flowsheet data found.

## 2018-12-26 NOTE — Progress Notes (Signed)
Called report to nurse Dara at [facility]. Nurse Viera West denied having any further questions. Pt has had lunch and now we are awaiting PTAR's arrival.

## 2018-12-29 DIAGNOSIS — U071 COVID-19: Secondary | ICD-10-CM | POA: Diagnosis not present

## 2018-12-29 DIAGNOSIS — N289 Disorder of kidney and ureter, unspecified: Secondary | ICD-10-CM | POA: Diagnosis not present

## 2018-12-29 DIAGNOSIS — D649 Anemia, unspecified: Secondary | ICD-10-CM | POA: Diagnosis not present

## 2018-12-29 DIAGNOSIS — Z7189 Other specified counseling: Secondary | ICD-10-CM | POA: Diagnosis not present

## 2018-12-30 DIAGNOSIS — U071 COVID-19: Secondary | ICD-10-CM | POA: Diagnosis not present

## 2018-12-30 DIAGNOSIS — A084 Viral intestinal infection, unspecified: Secondary | ICD-10-CM | POA: Diagnosis not present

## 2018-12-30 DIAGNOSIS — I639 Cerebral infarction, unspecified: Secondary | ICD-10-CM | POA: Diagnosis not present

## 2018-12-30 DIAGNOSIS — N179 Acute kidney failure, unspecified: Secondary | ICD-10-CM | POA: Diagnosis not present

## 2019-01-08 DIAGNOSIS — F0281 Dementia in other diseases classified elsewhere with behavioral disturbance: Secondary | ICD-10-CM | POA: Diagnosis not present

## 2019-01-08 DIAGNOSIS — I6389 Other cerebral infarction: Secondary | ICD-10-CM | POA: Diagnosis not present

## 2019-01-08 DIAGNOSIS — D6489 Other specified anemias: Secondary | ICD-10-CM | POA: Diagnosis not present

## 2019-01-08 DIAGNOSIS — N289 Disorder of kidney and ureter, unspecified: Secondary | ICD-10-CM | POA: Diagnosis not present

## 2019-01-08 DIAGNOSIS — E119 Type 2 diabetes mellitus without complications: Secondary | ICD-10-CM | POA: Diagnosis not present

## 2019-01-08 DIAGNOSIS — I10 Essential (primary) hypertension: Secondary | ICD-10-CM | POA: Diagnosis not present

## 2019-01-08 DIAGNOSIS — U071 COVID-19: Secondary | ICD-10-CM | POA: Diagnosis not present

## 2019-01-12 DIAGNOSIS — G301 Alzheimer's disease with late onset: Secondary | ICD-10-CM | POA: Diagnosis not present

## 2019-01-12 DIAGNOSIS — E1169 Type 2 diabetes mellitus with other specified complication: Secondary | ICD-10-CM | POA: Diagnosis not present

## 2019-01-12 DIAGNOSIS — F0391 Unspecified dementia with behavioral disturbance: Secondary | ICD-10-CM | POA: Diagnosis not present

## 2019-01-12 DIAGNOSIS — Z87440 Personal history of urinary (tract) infections: Secondary | ICD-10-CM | POA: Diagnosis not present

## 2019-01-14 DIAGNOSIS — M25552 Pain in left hip: Secondary | ICD-10-CM | POA: Diagnosis not present

## 2019-01-19 DIAGNOSIS — E1169 Type 2 diabetes mellitus with other specified complication: Secondary | ICD-10-CM | POA: Diagnosis not present

## 2019-01-19 DIAGNOSIS — G301 Alzheimer's disease with late onset: Secondary | ICD-10-CM | POA: Diagnosis not present

## 2019-01-19 DIAGNOSIS — Z87440 Personal history of urinary (tract) infections: Secondary | ICD-10-CM | POA: Diagnosis not present

## 2019-01-19 DIAGNOSIS — M25552 Pain in left hip: Secondary | ICD-10-CM | POA: Diagnosis not present

## 2019-01-19 DIAGNOSIS — F0391 Unspecified dementia with behavioral disturbance: Secondary | ICD-10-CM | POA: Diagnosis not present

## 2019-01-20 DIAGNOSIS — I469 Cardiac arrest, cause unspecified: Secondary | ICD-10-CM | POA: Diagnosis not present

## 2019-01-20 DIAGNOSIS — I499 Cardiac arrhythmia, unspecified: Secondary | ICD-10-CM | POA: Diagnosis not present

## 2019-01-20 DIAGNOSIS — U071 COVID-19: Secondary | ICD-10-CM | POA: Diagnosis not present

## 2019-01-20 DIAGNOSIS — R404 Transient alteration of awareness: Secondary | ICD-10-CM | POA: Diagnosis not present

## 2019-02-09 DIAGNOSIS — M6281 Muscle weakness (generalized): Secondary | ICD-10-CM | POA: Diagnosis not present

## 2019-02-09 DIAGNOSIS — U071 COVID-19: Secondary | ICD-10-CM | POA: Diagnosis not present

## 2019-02-09 DIAGNOSIS — R2681 Unsteadiness on feet: Secondary | ICD-10-CM | POA: Diagnosis not present

## 2019-02-09 DIAGNOSIS — J181 Lobar pneumonia, unspecified organism: Secondary | ICD-10-CM | POA: Diagnosis not present

## 2019-02-17 DIAGNOSIS — 419620001 Death: Secondary | SNOMED CT | POA: Diagnosis not present

## 2019-02-17 DEATH — deceased

## 2020-05-25 IMAGING — CT CT CERVICAL SPINE W/O CM
3 of 4 series · 13 of 33 positions shown, 16 images · non-contrast
Comparison: Head CT - 12/22/2017; 04/05/2008; head and C-spine CT -
06/25/2006

CLINICAL DATA: Patient found lying on floor beside his bed in
[HOSPITAL].

EXAM:
CT HEAD WITHOUT CONTRAST
CT CERVICAL SPINE WITHOUT CONTRAST
TECHNIQUE: Multidetector CT imaging of the head and cervical spine was
performed following the standard protocol without intravenous
contrast. Multiplanar CT image reconstructions of the cervical spine
were also generated.

[Series 6: sag bone · sagittal · 0.46mm/px · 5 of 61 slices shown, 6 images]
[im 21/61  bone]
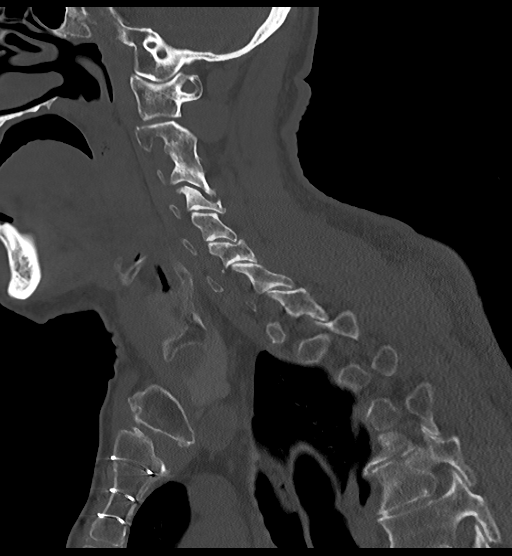
[im 26/61  bone]
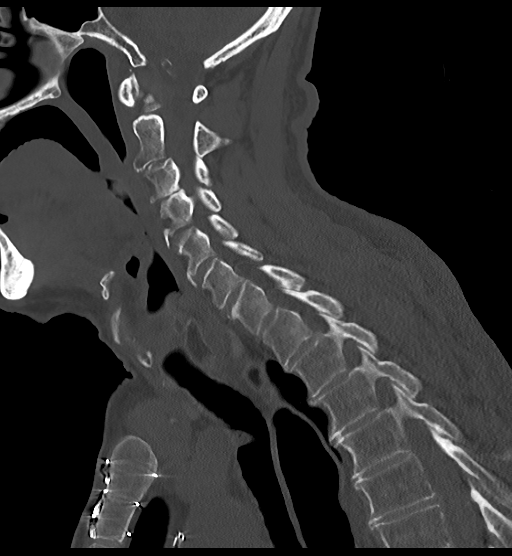
[im 31/61  soft-tissue]
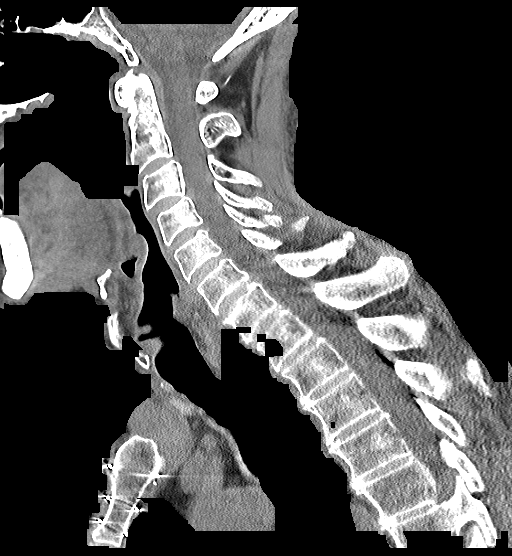
[im 31/61  bone]
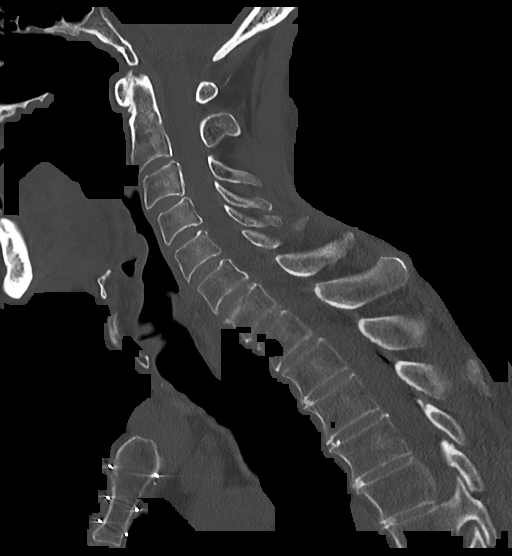
[im 36/61  bone]
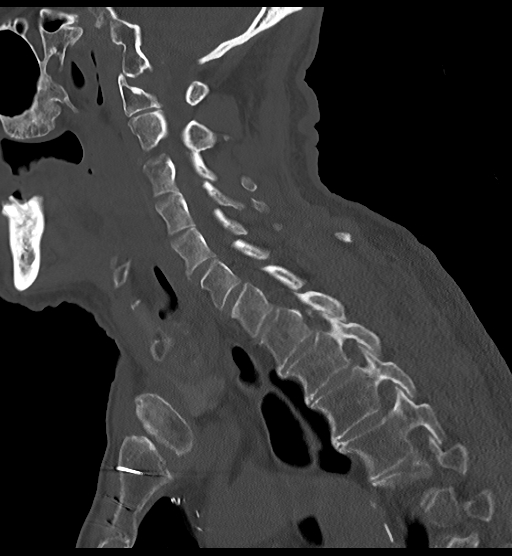
[im 41/61  bone]
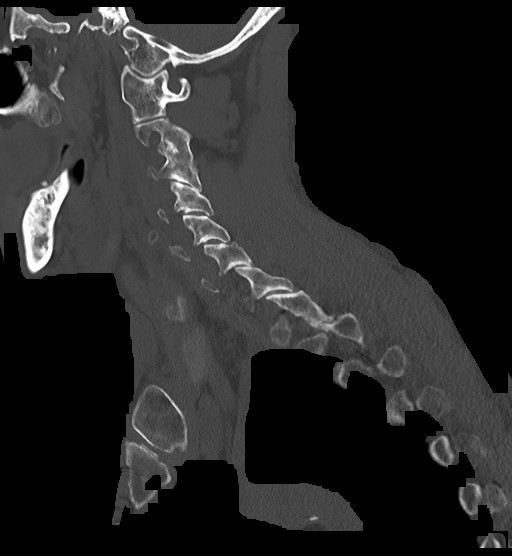

[Series 7: cor bone · coronal · 0.29mm/px · 3 of 61 slices shown]
[im 13/61  bone]
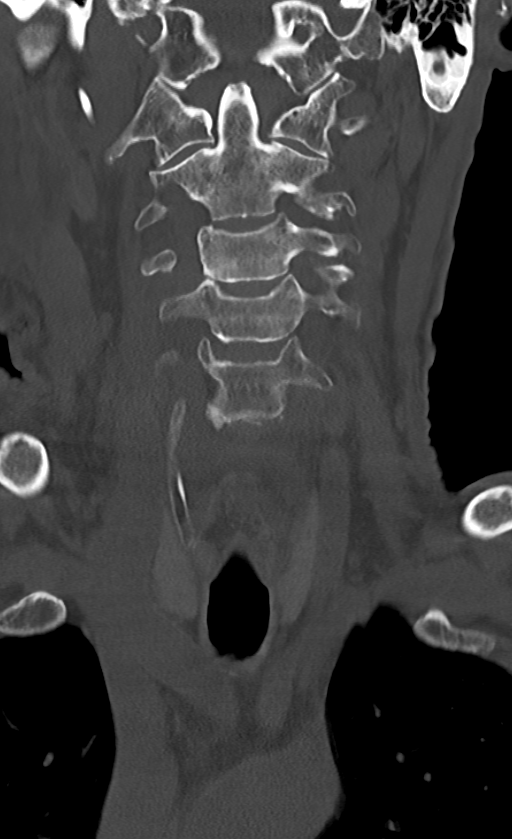
[im 25/61  bone]
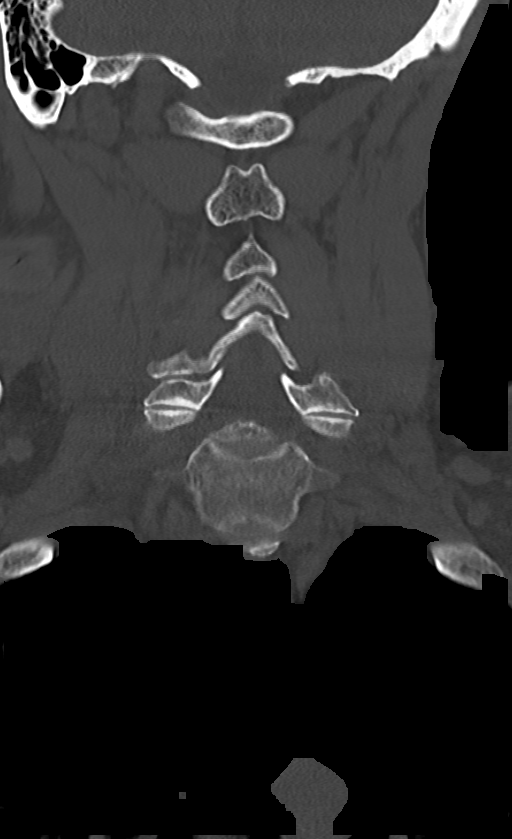
[im 37/61  bone]
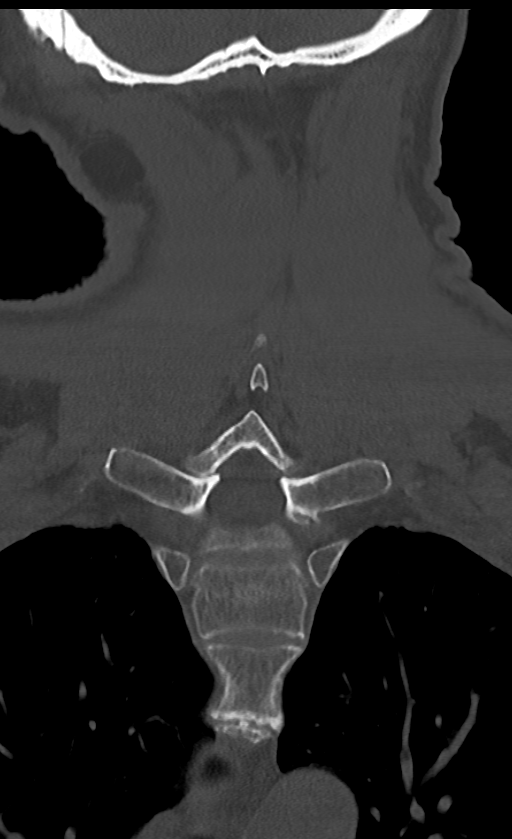

[Series 8: orthogonal axials · axial · 0.21mm/px · z∈[-255,-122]mm · 5 of 140 slices shown, 7 images]
[im 24/140  soft-tissue]
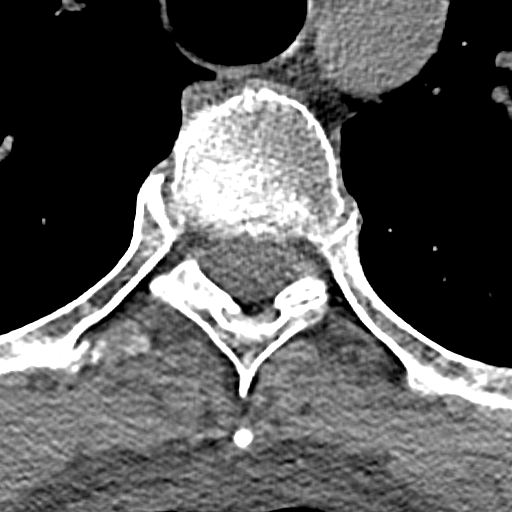
[im 24/140  bone]
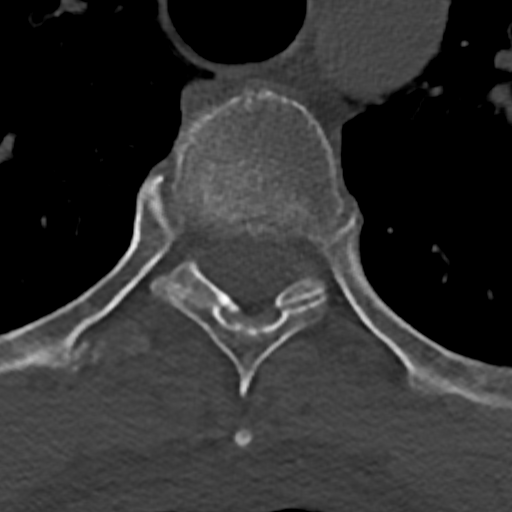
[im 47/140  bone]
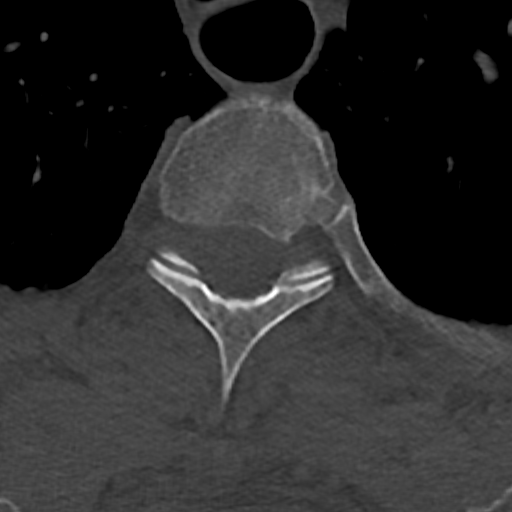
[im 70/140  bone]
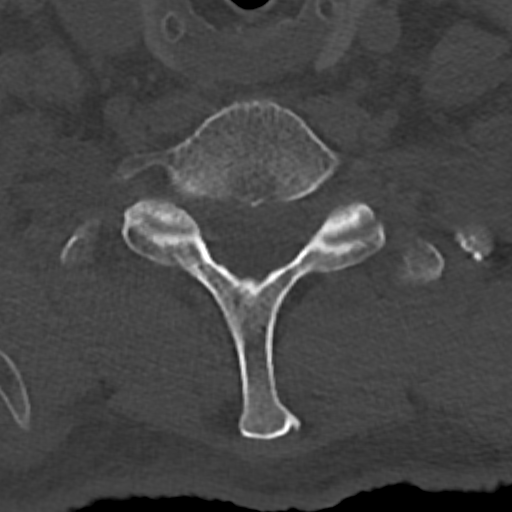
[im 93/140  bone]
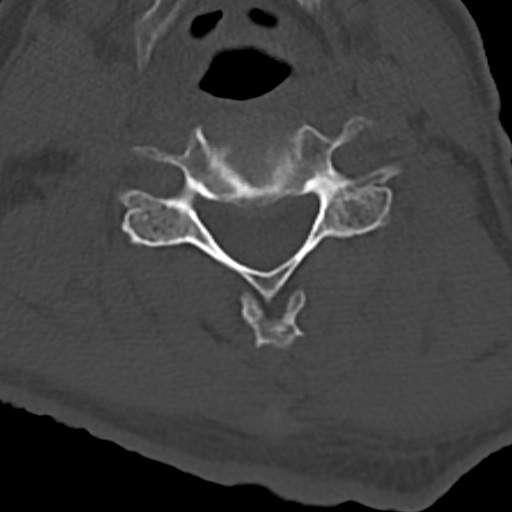
[im 116/140  soft-tissue]
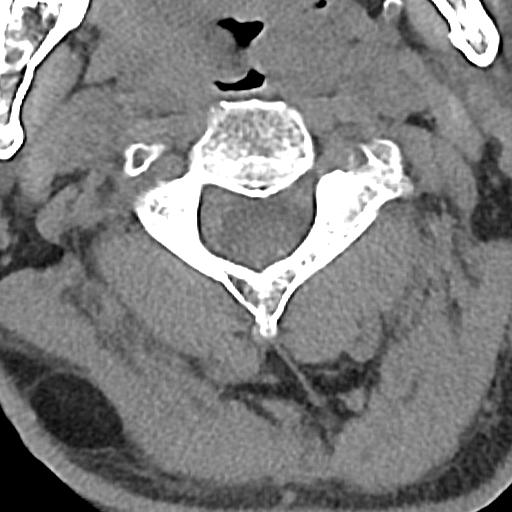
[im 116/140  bone]
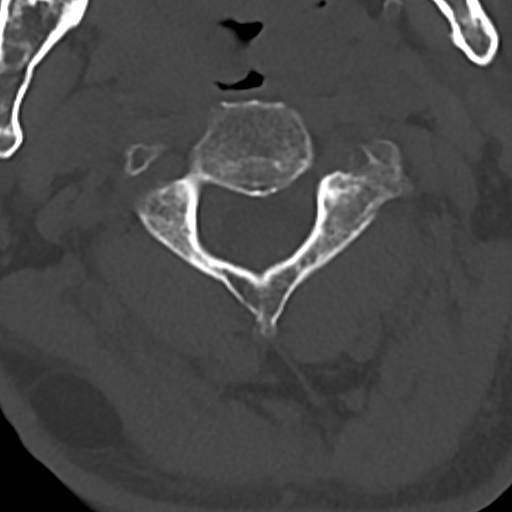

[13 of 33 positions shown; findings below may reference images not displayed]

FINDINGS: CT HEAD FINDINGS

Brain: Similar findings of atrophy with sulcal prominence and
centralized volume loss with commensurate ex vacuo dilatation the
ventricular system. Stable sequela of prior large right
parietooccipital lobe infarct with associated encephalomalacia and
ex vacuo dilatation of the occipital horn of the right lateral
ventricle. Re demonstrated rather extensive periventricular-as
compatible microvascular ischemic disease. Given extensive
background parenchymal abnormalities, there is no CT evidence of
superimposed acute large territory infarct. No intraparenchymal or
extra-axial mass or hemorrhage. Unchanged size and configuration of
the ventricles and basilar cisterns. No midline shift.

Vascular: Intracranial atherosclerosis.

Skull: No displaced calvarial fracture.

Sinuses/Orbits: Limited visualization of the paranasal sinuses and
mastoid air cells is normal. No air-fluid levels. Post bilateral
cataract surgery.

Other: Regional soft tissues appear normal.

CT CERVICAL SPINE FINDINGS

Alignment: C1 to the superior endplate of T6 is imaged. Normal
alignment of the cervical spine. No anterolisthesis or
retrolisthesis. The bilateral facets are normally aligned. Mild
degenerative change atlantodental articulation with joint space loss
and subchondral sclerosis. The dens is normally positioned between
the lateral masses of C1. Normal atlantodental and atlantoaxial
articulations. The bilateral facets are normally aligned.

Skull base and vertebrae: Cervical vertebral body heights are
preserved. Note is made of fusion of the bilateral C2-C3 facets.

Soft tissues and spinal canal: Prevertebral soft tissues are normal.

Disc levels: Mild multilevel cervical spine DDD, worse at C3-C4 and
C6-C7 with disc space height loss, endplate irregularity and
sclerosis.

Upper chest: Limited visualization of the lung apices demonstrates
focal bronchiectasis involving a segmental bronchi within the medial
aspect of the right lung apex (image 90, series 4), without
associated bronchial wall thickening. Sequela of prior median
sternotomy.

Other: No bulky cervical lymphadenopathy on this noncontrast
examination. Atherosclerotic plaque within the left carotid bulb.
Normal noncontrast appearance of the thyroid gland.
IMPRESSION: 1. Similar findings of atrophy, microvascular ischemic disease and
prior right parietoccipital lobe infarct without superimposed acute
intracranial process.
2. No fracture or static subluxation of the cervical spine.
3. Mild multilevel cervical spine DDD.
